# Patient Record
Sex: Female | Born: 1945 | Race: White | Hispanic: No | Smoking: Former smoker
Health system: Southern US, Community
[De-identification: ages and names within clinical notes are randomized; demographics above are authoritative.]

## PROBLEM LIST (undated history)

## (undated) DIAGNOSIS — M1990 Unspecified osteoarthritis, unspecified site: Secondary | ICD-10-CM

## (undated) DIAGNOSIS — M419 Scoliosis, unspecified: Secondary | ICD-10-CM

## (undated) DIAGNOSIS — T8859XA Other complications of anesthesia, initial encounter: Secondary | ICD-10-CM

## (undated) DIAGNOSIS — J329 Chronic sinusitis, unspecified: Secondary | ICD-10-CM

## (undated) DIAGNOSIS — K219 Gastro-esophageal reflux disease without esophagitis: Secondary | ICD-10-CM

## (undated) DIAGNOSIS — N903 Dysplasia of vulva, unspecified: Secondary | ICD-10-CM

## (undated) DIAGNOSIS — E039 Hypothyroidism, unspecified: Secondary | ICD-10-CM

## (undated) HISTORY — DX: Gastro-esophageal reflux disease without esophagitis: K21.9

## (undated) HISTORY — DX: Unspecified osteoarthritis, unspecified site: M19.90

## (undated) HISTORY — PX: OTHER SURGICAL HISTORY: SHX169

## (undated) HISTORY — DX: Scoliosis, unspecified: M41.9

## (undated) HISTORY — DX: Dysplasia of vulva, unspecified: N90.3

## (undated) HISTORY — DX: Hypothyroidism, unspecified: E03.9

## (undated) HISTORY — DX: Chronic sinusitis, unspecified: J32.9

## (undated) HISTORY — PX: NO PAST SURGERIES: SHX2092

---

## 2001-06-17 ENCOUNTER — Ambulatory Visit (HOSPITAL_COMMUNITY): Admission: RE | Admit: 2001-06-17 | Discharge: 2001-06-17 | Payer: Self-pay | Admitting: Obstetrics and Gynecology

## 2001-06-17 ENCOUNTER — Encounter: Payer: Self-pay | Admitting: Obstetrics and Gynecology

## 2001-06-30 DIAGNOSIS — D229 Melanocytic nevi, unspecified: Secondary | ICD-10-CM

## 2001-06-30 HISTORY — DX: Melanocytic nevi, unspecified: D22.9

## 2002-08-30 ENCOUNTER — Ambulatory Visit (HOSPITAL_COMMUNITY): Admission: RE | Admit: 2002-08-30 | Discharge: 2002-08-30 | Payer: Self-pay | Admitting: Family Medicine

## 2002-08-30 ENCOUNTER — Encounter: Payer: Self-pay | Admitting: Family Medicine

## 2002-11-09 ENCOUNTER — Encounter: Payer: Self-pay | Admitting: Obstetrics and Gynecology

## 2002-11-09 ENCOUNTER — Ambulatory Visit (HOSPITAL_COMMUNITY): Admission: RE | Admit: 2002-11-09 | Discharge: 2002-11-09 | Payer: Self-pay | Admitting: Obstetrics and Gynecology

## 2003-07-12 ENCOUNTER — Ambulatory Visit (HOSPITAL_COMMUNITY): Admission: RE | Admit: 2003-07-12 | Discharge: 2003-07-12 | Payer: Self-pay | Admitting: Family Medicine

## 2003-07-16 ENCOUNTER — Ambulatory Visit (HOSPITAL_COMMUNITY): Admission: RE | Admit: 2003-07-16 | Discharge: 2003-07-16 | Payer: Self-pay | Admitting: Family Medicine

## 2003-12-10 ENCOUNTER — Ambulatory Visit (HOSPITAL_COMMUNITY): Admission: RE | Admit: 2003-12-10 | Discharge: 2003-12-10 | Payer: Self-pay | Admitting: Obstetrics and Gynecology

## 2005-01-07 ENCOUNTER — Ambulatory Visit (HOSPITAL_COMMUNITY): Admission: RE | Admit: 2005-01-07 | Discharge: 2005-01-07 | Payer: Self-pay | Admitting: General Surgery

## 2005-01-20 ENCOUNTER — Ambulatory Visit (HOSPITAL_COMMUNITY): Admission: RE | Admit: 2005-01-20 | Discharge: 2005-01-20 | Payer: Self-pay | Admitting: Obstetrics and Gynecology

## 2006-03-23 ENCOUNTER — Ambulatory Visit (HOSPITAL_COMMUNITY): Admission: RE | Admit: 2006-03-23 | Discharge: 2006-03-23 | Payer: Self-pay | Admitting: Obstetrics and Gynecology

## 2007-05-10 ENCOUNTER — Ambulatory Visit (HOSPITAL_COMMUNITY): Admission: RE | Admit: 2007-05-10 | Discharge: 2007-05-10 | Payer: Self-pay | Admitting: Obstetrics and Gynecology

## 2007-07-19 ENCOUNTER — Ambulatory Visit (HOSPITAL_COMMUNITY): Admission: RE | Admit: 2007-07-19 | Discharge: 2007-07-19 | Payer: Self-pay | Admitting: Orthopaedic Surgery

## 2008-05-11 ENCOUNTER — Ambulatory Visit (HOSPITAL_COMMUNITY): Admission: RE | Admit: 2008-05-11 | Discharge: 2008-05-11 | Payer: Self-pay | Admitting: Obstetrics and Gynecology

## 2009-08-12 ENCOUNTER — Ambulatory Visit (HOSPITAL_COMMUNITY): Admission: RE | Admit: 2009-08-12 | Discharge: 2009-08-12 | Payer: Self-pay | Admitting: Obstetrics and Gynecology

## 2010-03-22 ENCOUNTER — Encounter: Payer: Self-pay | Admitting: Obstetrics and Gynecology

## 2010-07-18 NOTE — H&P (Signed)
NAME:  Kristi Weiss, Kristi Weiss               ACCOUNT NO.:  [id number]   MEDICAL RECORD NO.:  [id number]          PATIENT TYPE:  AMB   LOCATION:                                FACILITY:  [facility]   PHYSICIAN:  Dalia Heading, M.D.  DATE OF BIRTH:  03/17/1945   DATE OF ADMISSION:  DATE OF DISCHARGE:  LH                                HISTORY & PHYSICAL   CHIEF COMPLAINT:  Need for screening colonoscopy.   HISTORY OF PRESENT ILLNESS:  The patient is a 65 year old white female who  is referred for endoscopic evaluation.  She needs a colonoscopy for  screening purposes.  No abdominal pain, weight loss, nausea, vomiting,  diarrhea, constipation, melena, hematochezia have been noted.  She has never  had a colonoscopy.  There is no family history of colon carcinoma.   PAST MEDICAL HISTORY:  Hypothyroidism.   PAST SURGICAL HISTORY:  Noncontributory.   CURRENT MEDICATIONS:  Synthroid.   ALLERGIES:  No known drug allergies.   REVIEW OF SYSTEMS:  Noncontributory.   PHYSICAL EXAMINATION:  GENERAL:  The patient is a well-developed, well-  nourished, white female in no acute distress.  LUNGS:  Clear to auscultation with equal breath sounds bilaterally.  HEART:  Reveals a regular rate and rhythm without S3, S4, or murmurs.  ABDOMEN:  Soft, nontender, nondistended.  No hepatosplenomegaly or masses  are noted.  RECTAL:  Deferred to the procedure.   IMPRESSION:  Need for screening colonoscopy.   PLAN:  The patient is scheduled for a colonoscopy on December 31, 2004.  The  risks and benefits of the procedure including bleeding and perforation were  fully explained to the patient, gave informed consent.      Dalia Heading, M.D.  Electronically Signed     MAJ/MEDQ  D:  12/16/2004  T:  12/16/2004  Job:  [id number]   cc:   Dalia Heading, M.D.  Fax: [phone]   [facility]  Fax: [phone]   Patrica Duel, M.D.  Fax: [phone]

## 2010-11-24 ENCOUNTER — Other Ambulatory Visit (HOSPITAL_COMMUNITY): Payer: Self-pay | Admitting: Obstetrics and Gynecology

## 2010-11-24 DIAGNOSIS — Z139 Encounter for screening, unspecified: Secondary | ICD-10-CM

## 2010-11-27 ENCOUNTER — Ambulatory Visit (HOSPITAL_COMMUNITY): Payer: Self-pay

## 2010-12-01 ENCOUNTER — Ambulatory Visit (HOSPITAL_COMMUNITY)
Admission: RE | Admit: 2010-12-01 | Discharge: 2010-12-01 | Disposition: A | Discharge status: Home / Self Care | Payer: 59 | Source: Ambulatory Visit | Attending: Obstetrics and Gynecology | Admitting: Obstetrics and Gynecology

## 2010-12-01 DIAGNOSIS — Z139 Encounter for screening, unspecified: Secondary | ICD-10-CM

## 2010-12-01 DIAGNOSIS — Z1231 Encounter for screening mammogram for malignant neoplasm of breast: Secondary | ICD-10-CM | POA: Insufficient documentation

## 2012-01-04 ENCOUNTER — Other Ambulatory Visit (HOSPITAL_COMMUNITY): Payer: Self-pay | Admitting: Obstetrics and Gynecology

## 2012-01-04 DIAGNOSIS — Z139 Encounter for screening, unspecified: Secondary | ICD-10-CM

## 2012-01-11 ENCOUNTER — Ambulatory Visit (HOSPITAL_COMMUNITY)
Admission: RE | Admit: 2012-01-11 | Discharge: 2012-01-11 | Disposition: A | Discharge status: Home / Self Care | Payer: 59 | Source: Ambulatory Visit | Attending: Obstetrics and Gynecology | Admitting: Obstetrics and Gynecology

## 2012-01-11 DIAGNOSIS — Z139 Encounter for screening, unspecified: Secondary | ICD-10-CM

## 2012-01-11 DIAGNOSIS — Z1231 Encounter for screening mammogram for malignant neoplasm of breast: Secondary | ICD-10-CM | POA: Insufficient documentation

## 2012-04-12 DIAGNOSIS — C4492 Squamous cell carcinoma of skin, unspecified: Secondary | ICD-10-CM

## 2012-04-12 HISTORY — DX: Squamous cell carcinoma of skin, unspecified: C44.92

## 2013-03-15 ENCOUNTER — Other Ambulatory Visit (HOSPITAL_COMMUNITY): Payer: Self-pay | Admitting: Obstetrics and Gynecology

## 2013-03-15 DIAGNOSIS — Z139 Encounter for screening, unspecified: Secondary | ICD-10-CM

## 2013-03-21 ENCOUNTER — Ambulatory Visit (HOSPITAL_COMMUNITY)
Admission: RE | Admit: 2013-03-21 | Discharge: 2013-03-21 | Disposition: A | Discharge status: Home / Self Care | Payer: Medicare HMO | Source: Ambulatory Visit | Attending: Obstetrics and Gynecology | Admitting: Obstetrics and Gynecology

## 2013-03-21 DIAGNOSIS — Z139 Encounter for screening, unspecified: Secondary | ICD-10-CM

## 2013-03-21 DIAGNOSIS — Z1231 Encounter for screening mammogram for malignant neoplasm of breast: Secondary | ICD-10-CM | POA: Insufficient documentation

## 2013-05-09 ENCOUNTER — Other Ambulatory Visit (HOSPITAL_COMMUNITY): Payer: Self-pay | Admitting: Internal Medicine

## 2013-05-09 DIAGNOSIS — J329 Chronic sinusitis, unspecified: Secondary | ICD-10-CM

## 2013-05-11 ENCOUNTER — Ambulatory Visit (HOSPITAL_COMMUNITY): Payer: Medicare HMO

## 2013-05-17 ENCOUNTER — Ambulatory Visit (HOSPITAL_COMMUNITY)
Admission: RE | Admit: 2013-05-17 | Discharge: 2013-05-17 | Disposition: A | Discharge status: Home / Self Care | Payer: Medicare HMO | Source: Ambulatory Visit | Attending: Orthopedic Surgery | Admitting: Orthopedic Surgery

## 2013-05-17 ENCOUNTER — Other Ambulatory Visit: Payer: Self-pay | Admitting: Orthopedic Surgery

## 2013-05-17 DIAGNOSIS — M25561 Pain in right knee: Secondary | ICD-10-CM

## 2013-05-17 DIAGNOSIS — M25469 Effusion, unspecified knee: Secondary | ICD-10-CM | POA: Insufficient documentation

## 2013-05-17 DIAGNOSIS — M25569 Pain in unspecified knee: Secondary | ICD-10-CM | POA: Insufficient documentation

## 2013-05-17 DIAGNOSIS — R937 Abnormal findings on diagnostic imaging of other parts of musculoskeletal system: Secondary | ICD-10-CM | POA: Insufficient documentation

## 2013-05-17 DIAGNOSIS — M259 Joint disorder, unspecified: Secondary | ICD-10-CM | POA: Insufficient documentation

## 2013-05-18 ENCOUNTER — Ambulatory Visit (HOSPITAL_COMMUNITY): Payer: Medicare HMO

## 2013-05-18 ENCOUNTER — Ambulatory Visit (INDEPENDENT_AMBULATORY_CARE_PROVIDER_SITE_OTHER): Payer: Medicare HMO | Admitting: Orthopedic Surgery

## 2013-05-18 ENCOUNTER — Encounter: Payer: Self-pay | Admitting: Orthopedic Surgery

## 2013-05-18 VITALS — BP 150/88 | Ht 68.0 in | Wt 234.0 lb

## 2013-05-18 DIAGNOSIS — IMO0002 Reserved for concepts with insufficient information to code with codable children: Secondary | ICD-10-CM

## 2013-05-18 DIAGNOSIS — M1710 Unilateral primary osteoarthritis, unspecified knee: Secondary | ICD-10-CM | POA: Insufficient documentation

## 2013-05-18 DIAGNOSIS — M179 Osteoarthritis of knee, unspecified: Secondary | ICD-10-CM

## 2013-05-18 NOTE — Progress Notes (Signed)
Patient ID: Kristi Weiss, female   DOB: [birth year], 68 y.o.   MRN: [id number] New patient  Chief Complaint  Patient presents with  . Knee Pain    Right knee pain, no injury    History the patient reports a 5-6 week history of acute onset of medial knee pain which is worse in the morning gets better and then is worse again at night. She feels sharp dull burning medial pain 7/10 somewhat relieved by ibuprofen but not completely. She also reports catching tightness and swelling and decreased range of motion. Previous treatment includes prednisone and ibuprofen 3 tablets at a time.  Review of systems fatigue heartburn and joint aches otherwise normal she is allergic to penicillin or rashes hypothyroidism sinusitis history of scoliosis. Denied any previous surgery  She takes Synthroid Nexium and Levaquin family history of lung disease cancer diabetes arthritis social history married retired does not smoke or drink  Vital signs: BP 150/88  Ht 5\' 8"  (1.727 m)  Wt 234 lb (106.142 kg)  BMI 35.59 kg/m2   General the patient is well-developed and well-nourished grooming and hygiene are normal Oriented x3 Mood and affect normal Ambulation normal  Inspection of the left knee shows she has some loss of motion and no tenderness or swelling, knee remains stable motor exam normal skin intact  Right knee limited range of motion large joint effusion joints are stable strength is normal skin is clean dry and intact neurovascular exam normal  4 views of the knee were taken at the hospital she has medial arthritis with osteophytes and joint space narrowing  Impression fusion with osteoarthritis  Recommend aspiration injection  She is concerned about arthritis medications because of side effects  She does well with ibuprofen so we will take 3 tablets 3 times a day for 2 weeks then as needed I will see her again in 6 weeks  We aspirated and injected her knee  If she's not better then we will obtain  an MRI to evaluate for meniscal tear  She will also receive physical therapy to strengthen the knee.  Procedure aspiration plus Injection right knee Medications: Ethyl chloride for topical anesthetic. Lidocaine 1% 3 cc. 40 mg of Depo-Medrol per mL, 1 mL. Verbal consent. Timeout to confirm site of injection as right knee Alcohol was used to clean the skin followed by ethyl chloride to anesthetize the skin. A lateral approach was used to aspirate the right knee we obtained 50 cc of yellow fluid, we then proceeded to inject the knee with lidocaine and Depo-Medrol.  No complications were noted. A sterile bandage was applied.

## 2013-05-18 NOTE — Patient Instructions (Addendum)
Take 3 Ibuprofen 3 times a day for two weeks and then as needed Call to arrange therapy at [facility] and Tear Disorders of the Knee (Arthritis, Osteoarthritis) Everyone will experience wear and tear injuries (arthritis, osteoarthritis) of the knee. These are the changes we all get as we age. They come from the joint stress of daily living. The amount of cartilage damage in your knee and your symptoms determine if you need surgery. Mild problems require approximately two months recovery time. More severe problems take several months to recover. With mild problems, your surgeon may find worn and rough cartilage surfaces. With severe changes, your surgeon may find cartilage that has completely worn away and exposed the bone. Loose bodies of bone and cartilage, bone spurs (excess bone growth), and injuries to the menisci (cushions between the large bones of your leg) are also common. All of these problems can cause pain. For a mild wear and tear problem, rough cartilage may simply need to be shaved and smoothed. For more severe problems with areas of exposed bone, your surgeon may use an instrument for roughing up the bone surfaces to stimulate new cartilage growth. Loose bodies are usually removed. Torn menisci may be trimmed or repaired.  Osteoarthritis Osteoarthritis is a disease that causes soreness and swelling (inflammation) of a joint. It occurs when the cartilage at the affected joint wears down. Cartilage acts as a cushion, covering the ends of bones where they meet to form a joint. Osteoarthritis is the most common form of arthritis. It often occurs in older people. The joints affected most often by this condition include those in the:  Ends of the fingers.  Thumbs.  Neck.  Lower back.  Knees.  Hips. CAUSES  Over time, the cartilage that covers the ends of bones begins to wear away. This causes bone to rub on bone, producing pain and stiffness in the affected joints.  RISK  FACTORS Certain factors can increase your chances of having osteoarthritis, including:  Older age.  Excessive body weight.  Overuse of joints. SIGNS AND SYMPTOMS   Pain, swelling, and stiffness in the joint.  Over time, the joint may lose its normal shape.  Small deposits of bone (osteophytes) may grow on the edges of the joint.  Bits of bone or cartilage can break off and float inside the joint space. This may cause more pain and damage. DIAGNOSIS  Your health care provider will do a physical exam and ask about your symptoms. Various tests may be ordered, such as:  X-rays of the affected joint.  An MRI scan.  Blood tests to rule out other types of arthritis.  Joint fluid tests. This involves using a needle to draw fluid from the joint and examining the fluid under a microscope. TREATMENT  Goals of treatment are to control pain and improve joint function. Treatment plans may include:  A prescribed exercise program that allows for rest and joint relief.  A weight control plan.  Pain relief techniques, such as:  Properly applied heat and cold.  Electric pulses delivered to nerve endings under the skin (transcutaneous electrical nerve stimulation, TENS).  Massage.  Certain nutritional supplements.  Medicines to control pain, such as:  Acetaminophen.  Nonsteroidal anti-inflammatory drugs (NSAIDs), such as naproxen.  Narcotic or central-acting agents, such as tramadol.  Corticosteroids. These can be given orally or as an injection.  Surgery to reposition the bones and relieve pain (osteotomy) or to remove loose pieces of bone and cartilage. Joint  replacement may be needed in advanced states of osteoarthritis. HOME CARE INSTRUCTIONS   Only take over-the-counter or prescription medicines as directed by your health care provider. Take all medicines exactly as instructed.  Maintain a healthy weight. Follow your health care provider's instructions for weight control.  This may include dietary instructions.  Exercise as directed. Your health care provider can recommend specific types of exercise. These may include:  Strengthening exercises These are done to strengthen the muscles that support joints affected by arthritis. They can be performed with weights or with exercise bands to add resistance.  Aerobic activities These are exercises, such as brisk walking or low-impact aerobics, that get your heart pumping.  Range-of-motion activities These keep your joints limber.  Balance and agility exercises These help you maintain daily living skills.  Rest your affected joints as directed by your health care provider.  Follow up with your health care provider as directed. SEEK MEDICAL CARE IF:   Your skin turns red.  You develop a rash in addition to your joint pain.  You have worsening joint pain. SEEK IMMEDIATE MEDICAL CARE IF:  You have a significant loss of weight or appetite.  You have a fever along with joint or muscle aches.  You have night sweats. [place] of Arthritis and Musculoskeletal and Skin Diseases: www.niams.SouthExposed.es [employer] on Aging: http://kim-miller.com/ American College of Rheumatology: www.rheumatology.org Document Released: 02/16/2005 Document Revised: 12/07/2012 Document Reviewed: 10/24/2012 [facility] Patient Information 2014 [place], [place].   Joint Injection  Care After  Refer to this sheet in the next few days. These instructions provide you with information on caring for yourself after you have had a joint injection. Your caregiver also may give you more specific instructions. Your treatment has been planned according to current medical practices, but problems sometimes occur. Call your caregiver if you have any problems or questions after your procedure.  After any type of joint injection, it is not uncommon to experience:  Soreness, swelling, or bruising around the injection site.    Mild numbness, tingling, or weakness around the injection site caused by the numbing medicine used before or with the injection. It also is possible to experience the following effects associated with the specific agent after injection:  Iodine-based contrast agents:  Allergic reaction (itching, hives, widespread redness, and swelling beyond the injection site).  Corticosteroids (These effects are rare.):  Allergic reaction.  Increased blood sugar levels (If you have diabetes and you notice that your blood sugar levels have increased, notify your caregiver).  Increased blood pressure levels.  Mood swings.  Hyaluronic acid in the use of viscosupplementation.  Temporary heat or redness.  Temporary rash and itching.  Increased fluid accumulation in the injected joint. These effects all should resolve within a day after your procedure.  HOME CARE INSTRUCTIONS  Limit yourself to light activity the day of your procedure. Avoid lifting heavy objects, bending, stooping, or twisting.  Take prescription or over-the-counter pain medication as directed by your caregiver.  You may apply ice to your injection site to reduce pain and swelling the day of your procedure. Ice may be applied 3-4 times:  Put ice in a plastic bag.  Place a towel between your skin and the bag.  Leave the ice on for no longer than 15-20 minutes each time. SEEK IMMEDIATE MEDICAL CARE IF:  Pain and swelling get worse rather than better or extend beyond the injection site.  Numbness does not go away.  Blood or fluid  continues to leak from the injection site.  You have chest pain.  You have swelling of your face or tongue.  You have trouble breathing or you become dizzy.  You develop a fever, chills, or severe tenderness at the injection site that last longer than 1 day. MAKE SURE YOU:  Understand these instructions.  Watch your condition.  Get help right away if you are not doing well or if you get worse. Document Released:  10/30/2010 Document Revised: 05/11/2011 Document Reviewed: 10/30/2010  [facility] Patient Information 2014 [place].

## 2013-05-22 ENCOUNTER — Ambulatory Visit (HOSPITAL_COMMUNITY)
Admission: RE | Admit: 2013-05-22 | Discharge: 2013-05-22 | Disposition: A | Discharge status: Home / Self Care | Payer: Medicare HMO | Source: Ambulatory Visit | Attending: Orthopedic Surgery | Admitting: Orthopedic Surgery

## 2013-05-22 DIAGNOSIS — R262 Difficulty in walking, not elsewhere classified: Secondary | ICD-10-CM | POA: Diagnosis present

## 2013-05-22 DIAGNOSIS — IMO0001 Reserved for inherently not codable concepts without codable children: Secondary | ICD-10-CM | POA: Insufficient documentation

## 2013-05-22 DIAGNOSIS — M25569 Pain in unspecified knee: Secondary | ICD-10-CM | POA: Insufficient documentation

## 2013-05-22 DIAGNOSIS — R269 Unspecified abnormalities of gait and mobility: Secondary | ICD-10-CM | POA: Insufficient documentation

## 2013-05-22 NOTE — Evaluation (Signed)
Physical Therapy Evaluation  Patient Details  Name: Kristi Weiss MRN: [id number] Date of Birth: 12-01-1945  Today's Date: 05/22/2013 Time: 1300-1350 PT Time Calculation (min): 50 min Charges: 1 Evaluation, 30 min therapeutic ex              Visit#: 1 of 8  Re-eval: 06/21/13    Authorization: [place] medicare  Authorized visits: 1 of 10    Past Medical History:  Past Medical History  Diagnosis Date  . Thyroid disease   . Scoliosis   . Sinusitis    Past Surgical History: No past surgical history on file.  Subjective Symptoms/Limitations Symptoms: Patient felt knee start popping in January, pain along medial knee, popping in the center "it felt like it was shifting over (patella)." More pain in the morning. pain wakes patient while sleeping.  Pertinent History: L knee pain that improved followign cortizone injection. Patient recieved a cortizone injection on 05/17/13 in R knee.  History of scoliosis.  Limitations: Walking How long can you walk comfortably?: pain with initial ambulation and after resting following walking.  Repetition: Decreases Symptoms Pain Assessment Currently in Pain?: Yes Pain Score: 6  Pain Location: Knee Pain Orientation: Right;Mid;Medial Pain Type: Chronic pain Pain Onset: More than a month ago Pain Frequency: Intermittent Pain Relieving Factors: eases with walking but increases with prolonged walking.  Effect of Pain on Daily Activities: gardenig and yard work.   Balance Screening Balance Screen Has the patient fallen in the past 6 months: No   Physical Therapy Assessment and Plan PT Assessment and Plan Clinical Impression Statement: Patient displays Rt medial knee pain secondary to poor loading and unloading mechanics with gait. Patient's primary functional limitation is walking. All knee ligamentous and meniscal testing is WNL. Patient's knee pain is attributed to limited quad mobilitly resultign in increased pull on the knee as well as  decreased  hip IR/ER, Abd/Add, and limited extension/flexion ROM all resulting in limited gait mobility, thes mobility limitations are causing the patient to land with excessive force through her knee and subsequent inflammation follwoing walking. Patient would benefit from skilled physical therapy for 4 weeks 2x a week to progress HEP  to  progress hip and knee mobility and eventually progress strengthenign as patient's knee pain improves to assist ptietn in returning to workign in her garden and being ambulate without pain.  Pt will benefit from skilled therapeutic intervention in order to improve on the following deficits: Abnormal gait;Decreased activity tolerance;Decreased mobility;Increased muscle spasms;Decreased range of motion;Decreased strength;Difficulty walking Rehab Potential: Excellent PT Frequency: Min 2X/week PT Duration: 4 weeks PT Treatment/Interventions: Gait training;Stair training;Functional mobility training;Therapeutic activities;Therapeutic exercise;Manual techniques;Patient/family education PT Plan: Current focus opn mobility training of hip and knee with focus on hip IR/ER, Abd/Add. Continue quad stretching, split stance hip excursions, walking hip excursions, claf stretching. Next session initialize hip flexor stretch, Groin stretch, Piriformis stretch. Progress to strengthening s ROM improves.     Goals Home Exercise Program Pt/caregiver will Perform Home Exercise Program: For increased ROM;Independently;For increased strengthening PT Goal: Perform Home Exercise Program - Progress: Goal set today PT Short Term Goals Time to Complete Short Term Goals: 2 weeks PT Short Term Goal 1: Patient hip IR/ER will improve to 40 degrees bilateral to indicate improved ability of hip to absorb heel strike and foot flat durign gait. PT Short Term Goal 2: patient's Eli test will improve to 110 degrees for improved quad length to indicate decreased muscle spasms in anterior thigh muscles.   PT Short Term  Goal 3: Patient's squat depth will improve to >90 degrees to progress patient towards half kneeling so she may work in her garden PT Short Term Goal 4: Patient's dorsiflexion mobility will improve to 20 degrees to indicatte decreased calf cramps and allow for increased stride length.  PT Long Term Goals Time to Complete Long Term Goals: 4 weeks PT Long Term Goal 1: Patient will walk with knee pain <2/10 for >3 hours.  PT Long Term Goal 2: Patient will be able to half kneel inorder to work in garden without knee or back pain.   Problem List Patient Active Problem List   Diagnosis Date Noted  . Difficulty in walking(719.7) 05/22/2013  . Arthritis of knee, degenerative 05/18/2013    PT - End of Session Activity Tolerance: Patient tolerated treatment well General Behavior During Therapy: WFL for tasks assessed/performed PT Plan of Care PT Home Exercise Plan:  quad stretching, split stance hip excursions, walking hip excursions, claf stretching  PT Patient Instructions: 2x daily Consulted and Agree with Plan of Care: Patient  GP Functional Assessment Tool Used: Clinical judgement Functional Limitation: Mobility: Walking and moving around Mobility: Walking and Moving Around Current Status (U1324): At least 20 percent but less than 40 percent impaired, limited or restricted Mobility: Walking and Moving Around Goal Status [id number]): At least 1 percent but less than 20 percent impaired, limited or restricted  [name] 05/22/2013, 5:20 PM  Physician Documentation Your signature is required to indicate approval of the treatment plan as stated above.  Please sign and either send electronically or make a copy of this report for your files and return this physician signed original.   Please mark one 1.__approve of plan  2. ___approve of plan with the following conditions.   ______________________________                                                           _____________________ Physician Signature                                                                                                             Date

## 2013-05-22 NOTE — Evaluation (Signed)
Physical Therapy Evaluation Patient Details  Name: Kristi Weiss MRN: [id number] Date of Birth: 02-17-1946  Today's Date: 05/22/2013 Time: 1300-1350 PT Time Calculation (min): 50 min              Visit#: 1 of 8  Re-eval: 06/21/13   Authorization: Bernadene Person     Past Medical History:  Past Medical History  Diagnosis Date  . Thyroid disease   . Scoliosis   . Sinusitis    Past Surgical History: No past surgical history on file.  Subjective Symptoms/Limitations Symptoms: Patient felt knee start popping in January, pain along medial knee, popping in the center "it felt like it was shifting over (patella)." More pain in the morning. pain wakes patient while sleeping.  Pertinent History: L knee pain that improved followign cortizone injection. Patient recieved a cortizone injection on 05/17/13 in R knee.  History of scoliosis.  Limitations: Walking How long can you walk comfortably?: pain with initial ambulation and after resting following walking.  Repetition: Decreases Symptoms Pain Assessment Currently in Pain?: Yes Pain Score: 6  Pain Location: Knee Pain Orientation: Right;Mid;Medial Pain Type: Chronic pain Pain Onset: More than a month ago Pain Frequency: Intermittent Pain Relieving Factors: eases with walking but increases with prolonged walking.  Effect of Pain on Daily Activities: gardenig and yard work.   Balance Screening Balance Screen Has the patient fallen in the past 6 months: No  Exercise/Treatments Stretches Sports administrator: 3 reps;30 seconds Gastroc Stretch: 3 reps;30 seconds Standing Other Standing Knee Exercises: split stance 3D hip excursions x 10 each, Walking  Other Standing Knee Exercises: Walking lateral hip excursion x 53ft, walking IR hip excursion x 63ft  Physical Therapy Assessment and Plan PT Assessment and Plan Clinical Impression Statement: Patient displays Rt medial knee pain secondary to poor loading and unloading mechanics with gait.  Patient's primary functional limitation is walking. All knee ligamentous and meniscal testing is WNL. Patient's knee pain is attributed to limited quad mobilitly resultign in increased pull on the knee as well as decreased  hip IR/ER, Abd/Add, and limited extension/flexion ROM all resulting in limited gait mobility, thes mobility limitations are causing the patient to land with excessive force through her knee and subsequent inflammation follwoing walking. Patient would benefit from skilled physical therapy for 4 weeks 2x a week to progress HEP  to  progress hip and knee mobility and eventually progress strengthenign as patient's knee pain improves to assist ptietn in returning to workign in her garden and being ambulate without pain.  Pt will benefit from skilled therapeutic intervention in order to improve on the following deficits: Abnormal gait;Decreased activity tolerance;Decreased mobility;Increased muscle spasms;Decreased range of motion;Decreased strength;Difficulty walking Rehab Potential: Excellent PT Frequency: Min 2X/week PT Duration: 4 weeks PT Treatment/Interventions: Gait training;Stair training;Functional mobility training;Therapeutic activities;Therapeutic exercise;Manual techniques;Patient/family education PT Plan: Current focus opn mobility training of hip and knee with focus on hip IR/ER, Abd/Add. Continue quad stretching, split stance hip excursions, walking hip excursions, claf stretching. Next session initialize hip flexor stretch, Groin stretch, Piriformis stretch. Progress to strengthening s ROM improves.     Goals Home Exercise Program Pt/caregiver will Perform Home Exercise Program: For increased ROM;Independently;For increased strengthening PT Goal: Perform Home Exercise Program - Progress: Goal set today PT Short Term Goals Time to Complete Short Term Goals: 2 weeks PT Short Term Goal 1: Patient hip IR/ER will improve to 40 degrees bilateral to indicate improved ability of  hip to absorb heel strike and foot flat durign gait. PT  Short Term Goal 2: patient's Eli test will improve to 110 degrees for improved quad length to indicate decreased muscle spasms in anterior thigh muscles.  PT Short Term Goal 3: Patient's squat depth will improve to >90 degrees to progress patient towards half kneeling so she may work in her garden PT Short Term Goal 4: Patient's dorsiflexion mobility will improve to 20 degrees to indicatte decreased calf cramps and allow for increased stride length.  PT Long Term Goals Time to Complete Long Term Goals: 4 weeks PT Long Term Goal 1: Patient will walk with knee pain <2/10 for >3 hours.  PT Long Term Goal 2: Patient will be able to half kneel inorder to work in garden without knee or back pain.   Problem List Patient Active Problem List   Diagnosis Date Noted  . Difficulty in walking(719.7) 05/22/2013  . Arthritis of knee, degenerative 05/18/2013    PT - End of Session Activity Tolerance: Patient tolerated treatment well General Behavior During Therapy: WFL for tasks assessed/performed PT Plan of Care PT Home Exercise Plan:  quad stretching, split stance hip excursions, walking hip excursions, claf stretching  PT Patient Instructions: 2x daily Consulted and Agree with Plan of Care: Patient  GP Functional Assessment Tool Used: Clinical judgement Functional Limitation: Mobility: Walking and moving around Mobility: Walking and Moving Around Current Status (N2355): At least 20 percent but less than 40 percent impaired, limited or restricted Mobility: Walking and Moving Around Goal Status [phone]): At least 1 percent but less than 20 percent impaired, limited or restricted  Vihan Santagata R DPT, PT 05/22/2013, 5:09 PM  Physician Documentation Your signature is required to indicate approval of the treatment plan as stated above.  Please sign and either send electronically or make a copy of this report for your files and return this  physician signed original.   Please mark one 1.__approve of plan  2. ___approve of plan with the following conditions.   ______________________________                                                          _____________________ Physician Signature                                                                                                             Date

## 2013-06-08 ENCOUNTER — Ambulatory Visit (HOSPITAL_COMMUNITY): Admission: RE | Admit: 2013-06-08 | Payer: Medicare HMO | Source: Ambulatory Visit

## 2013-06-20 ENCOUNTER — Ambulatory Visit (HOSPITAL_COMMUNITY): Payer: Medicare HMO

## 2013-06-29 ENCOUNTER — Ambulatory Visit (INDEPENDENT_AMBULATORY_CARE_PROVIDER_SITE_OTHER): Payer: Medicare HMO | Admitting: Orthopedic Surgery

## 2013-06-29 VITALS — BP 147/89 | Ht 68.0 in | Wt 234.0 lb

## 2013-06-29 DIAGNOSIS — M179 Osteoarthritis of knee, unspecified: Secondary | ICD-10-CM

## 2013-06-29 DIAGNOSIS — M1710 Unilateral primary osteoarthritis, unspecified knee: Secondary | ICD-10-CM

## 2013-06-29 DIAGNOSIS — IMO0002 Reserved for concepts with insufficient information to code with codable children: Secondary | ICD-10-CM

## 2013-06-29 NOTE — Patient Instructions (Signed)
Ibuprofen as needed exercises

## 2013-06-29 NOTE — Progress Notes (Signed)
Patient ID: Kristi Weiss, female   DOB: [birth year], 68 y.o.   MRN: [id number]  Chief Complaint  Patient presents with  . Follow-up    6 week recheck right knee s/p therapy    Osteoarthritis of the right knee. She has improved with decreased pain. She doesn't feel she needs anything other than mild pain relievers at this time. We discussed Orthovisc injections but not necessary right now. Knee flexion is good minimal crepitance normal stability tests are noted. No McMurray sign.  Review of systems negative catching locking neurologic findings negative.  BP 147/89  Ht 5\' 8"  (1.727 m)  Wt 234 lb (106.142 kg)  BMI 35.59 kg/m2 General appearance is normal, the patient is alert and oriented x3 with normal mood and affect.  Knee exam again shows good knee flexion minimal crepitance normal stability negative McMurray sign minimal tenderness no swelling.   Impression osteoarthritis right knee. Followup to be x-ray the knee.

## 2013-11-29 ENCOUNTER — Encounter: Payer: Self-pay | Admitting: Orthopedic Surgery

## 2014-07-23 ENCOUNTER — Other Ambulatory Visit (HOSPITAL_COMMUNITY): Payer: Self-pay | Admitting: Obstetrics and Gynecology

## 2014-07-23 DIAGNOSIS — Z1231 Encounter for screening mammogram for malignant neoplasm of breast: Secondary | ICD-10-CM

## 2014-08-15 ENCOUNTER — Ambulatory Visit (HOSPITAL_COMMUNITY): Payer: Medicare HMO

## 2014-11-14 ENCOUNTER — Ambulatory Visit (HOSPITAL_COMMUNITY)
Admission: RE | Admit: 2014-11-14 | Discharge: 2014-11-14 | Disposition: A | Discharge status: Home / Self Care | Payer: PPO | Source: Ambulatory Visit | Attending: Obstetrics and Gynecology | Admitting: Obstetrics and Gynecology

## 2014-11-14 DIAGNOSIS — Z1231 Encounter for screening mammogram for malignant neoplasm of breast: Secondary | ICD-10-CM

## 2015-05-15 DIAGNOSIS — Z6833 Body mass index (BMI) 33.0-33.9, adult: Secondary | ICD-10-CM | POA: Diagnosis not present

## 2015-05-15 DIAGNOSIS — K224 Dyskinesia of esophagus: Secondary | ICD-10-CM | POA: Diagnosis not present

## 2015-05-15 DIAGNOSIS — M1991 Primary osteoarthritis, unspecified site: Secondary | ICD-10-CM | POA: Diagnosis not present

## 2015-05-15 DIAGNOSIS — I493 Ventricular premature depolarization: Secondary | ICD-10-CM | POA: Diagnosis not present

## 2015-05-15 DIAGNOSIS — R0789 Other chest pain: Secondary | ICD-10-CM | POA: Diagnosis not present

## 2015-05-15 DIAGNOSIS — Z0001 Encounter for general adult medical examination with abnormal findings: Secondary | ICD-10-CM | POA: Diagnosis not present

## 2015-05-15 DIAGNOSIS — Z23 Encounter for immunization: Secondary | ICD-10-CM | POA: Diagnosis not present

## 2015-05-15 DIAGNOSIS — Z1389 Encounter for screening for other disorder: Secondary | ICD-10-CM | POA: Diagnosis not present

## 2015-05-15 DIAGNOSIS — E063 Autoimmune thyroiditis: Secondary | ICD-10-CM | POA: Diagnosis not present

## 2015-05-15 DIAGNOSIS — E6609 Other obesity due to excess calories: Secondary | ICD-10-CM | POA: Diagnosis not present

## 2015-05-15 DIAGNOSIS — R0902 Hypoxemia: Secondary | ICD-10-CM | POA: Diagnosis not present

## 2015-05-21 ENCOUNTER — Other Ambulatory Visit: Payer: Self-pay | Admitting: Internal Medicine

## 2015-05-21 DIAGNOSIS — Z23 Encounter for immunization: Secondary | ICD-10-CM

## 2015-05-23 ENCOUNTER — Other Ambulatory Visit: Payer: Self-pay | Admitting: Internal Medicine

## 2015-05-23 DIAGNOSIS — M810 Age-related osteoporosis without current pathological fracture: Secondary | ICD-10-CM

## 2015-06-05 DIAGNOSIS — R0789 Other chest pain: Secondary | ICD-10-CM | POA: Diagnosis not present

## 2015-06-05 DIAGNOSIS — R0602 Shortness of breath: Secondary | ICD-10-CM | POA: Diagnosis not present

## 2015-06-06 DIAGNOSIS — R0602 Shortness of breath: Secondary | ICD-10-CM | POA: Diagnosis not present

## 2015-06-06 DIAGNOSIS — R0789 Other chest pain: Secondary | ICD-10-CM | POA: Diagnosis not present

## 2015-08-02 DIAGNOSIS — Z1389 Encounter for screening for other disorder: Secondary | ICD-10-CM | POA: Diagnosis not present

## 2015-08-02 DIAGNOSIS — E781 Pure hyperglyceridemia: Secondary | ICD-10-CM | POA: Diagnosis not present

## 2015-08-02 DIAGNOSIS — Z0001 Encounter for general adult medical examination with abnormal findings: Secondary | ICD-10-CM | POA: Diagnosis not present

## 2015-08-02 DIAGNOSIS — Z6833 Body mass index (BMI) 33.0-33.9, adult: Secondary | ICD-10-CM | POA: Diagnosis not present

## 2015-08-02 DIAGNOSIS — R079 Chest pain, unspecified: Secondary | ICD-10-CM | POA: Diagnosis not present

## 2015-08-09 DIAGNOSIS — Z0001 Encounter for general adult medical examination with abnormal findings: Secondary | ICD-10-CM | POA: Diagnosis not present

## 2015-08-12 ENCOUNTER — Other Ambulatory Visit: Payer: PPO

## 2015-08-14 DIAGNOSIS — H2513 Age-related nuclear cataract, bilateral: Secondary | ICD-10-CM | POA: Diagnosis not present

## 2015-08-14 DIAGNOSIS — H40013 Open angle with borderline findings, low risk, bilateral: Secondary | ICD-10-CM | POA: Diagnosis not present

## 2015-08-15 DIAGNOSIS — D239 Other benign neoplasm of skin, unspecified: Secondary | ICD-10-CM | POA: Diagnosis not present

## 2015-08-15 DIAGNOSIS — L570 Actinic keratosis: Secondary | ICD-10-CM | POA: Diagnosis not present

## 2015-08-15 DIAGNOSIS — D485 Neoplasm of uncertain behavior of skin: Secondary | ICD-10-CM | POA: Diagnosis not present

## 2015-08-15 DIAGNOSIS — L821 Other seborrheic keratosis: Secondary | ICD-10-CM | POA: Diagnosis not present

## 2015-09-02 ENCOUNTER — Other Ambulatory Visit: Payer: PPO

## 2015-09-26 ENCOUNTER — Ambulatory Visit
Admission: RE | Admit: 2015-09-26 | Discharge: 2015-09-26 | Disposition: A | Discharge status: Home / Self Care | Payer: PPO | Source: Ambulatory Visit | Attending: Internal Medicine | Admitting: Internal Medicine

## 2015-09-26 DIAGNOSIS — M810 Age-related osteoporosis without current pathological fracture: Secondary | ICD-10-CM

## 2015-09-26 DIAGNOSIS — Z780 Asymptomatic menopausal state: Secondary | ICD-10-CM | POA: Diagnosis not present

## 2015-09-26 DIAGNOSIS — M8589 Other specified disorders of bone density and structure, multiple sites: Secondary | ICD-10-CM | POA: Diagnosis not present

## 2016-01-13 ENCOUNTER — Other Ambulatory Visit (HOSPITAL_COMMUNITY): Payer: Self-pay | Admitting: Internal Medicine

## 2016-01-13 DIAGNOSIS — Z1231 Encounter for screening mammogram for malignant neoplasm of breast: Secondary | ICD-10-CM

## 2016-01-29 ENCOUNTER — Ambulatory Visit (HOSPITAL_COMMUNITY)
Admission: RE | Admit: 2016-01-29 | Discharge: 2016-01-29 | Disposition: A | Discharge status: Home / Self Care | Payer: PPO | Source: Ambulatory Visit | Attending: Internal Medicine | Admitting: Internal Medicine

## 2016-01-29 DIAGNOSIS — Z1231 Encounter for screening mammogram for malignant neoplasm of breast: Secondary | ICD-10-CM

## 2016-02-05 DIAGNOSIS — E063 Autoimmune thyroiditis: Secondary | ICD-10-CM | POA: Diagnosis not present

## 2016-02-05 DIAGNOSIS — E039 Hypothyroidism, unspecified: Secondary | ICD-10-CM | POA: Diagnosis not present

## 2016-02-05 DIAGNOSIS — Z1389 Encounter for screening for other disorder: Secondary | ICD-10-CM | POA: Diagnosis not present

## 2016-02-05 DIAGNOSIS — B49 Unspecified mycosis: Secondary | ICD-10-CM | POA: Diagnosis not present

## 2016-02-05 DIAGNOSIS — Z6833 Body mass index (BMI) 33.0-33.9, adult: Secondary | ICD-10-CM | POA: Diagnosis not present

## 2016-02-05 DIAGNOSIS — Z23 Encounter for immunization: Secondary | ICD-10-CM | POA: Diagnosis not present

## 2016-02-05 DIAGNOSIS — E669 Obesity, unspecified: Secondary | ICD-10-CM | POA: Diagnosis not present

## 2016-08-24 DIAGNOSIS — Z6832 Body mass index (BMI) 32.0-32.9, adult: Secondary | ICD-10-CM | POA: Diagnosis not present

## 2016-08-24 DIAGNOSIS — E063 Autoimmune thyroiditis: Secondary | ICD-10-CM | POA: Diagnosis not present

## 2016-08-24 DIAGNOSIS — Z1389 Encounter for screening for other disorder: Secondary | ICD-10-CM | POA: Diagnosis not present

## 2016-08-24 DIAGNOSIS — H109 Unspecified conjunctivitis: Secondary | ICD-10-CM | POA: Diagnosis not present

## 2016-08-24 DIAGNOSIS — J302 Other seasonal allergic rhinitis: Secondary | ICD-10-CM | POA: Diagnosis not present

## 2016-08-24 DIAGNOSIS — M810 Age-related osteoporosis without current pathological fracture: Secondary | ICD-10-CM | POA: Diagnosis not present

## 2016-09-18 DIAGNOSIS — D229 Melanocytic nevi, unspecified: Secondary | ICD-10-CM | POA: Diagnosis not present

## 2016-09-18 DIAGNOSIS — L570 Actinic keratosis: Secondary | ICD-10-CM | POA: Diagnosis not present

## 2017-01-28 DIAGNOSIS — E782 Mixed hyperlipidemia: Secondary | ICD-10-CM | POA: Diagnosis not present

## 2017-01-28 DIAGNOSIS — E063 Autoimmune thyroiditis: Secondary | ICD-10-CM | POA: Diagnosis not present

## 2017-01-28 DIAGNOSIS — Z6833 Body mass index (BMI) 33.0-33.9, adult: Secondary | ICD-10-CM | POA: Diagnosis not present

## 2017-01-28 DIAGNOSIS — Z23 Encounter for immunization: Secondary | ICD-10-CM | POA: Diagnosis not present

## 2017-01-28 DIAGNOSIS — Z1389 Encounter for screening for other disorder: Secondary | ICD-10-CM | POA: Diagnosis not present

## 2017-01-28 DIAGNOSIS — E6609 Other obesity due to excess calories: Secondary | ICD-10-CM | POA: Diagnosis not present

## 2017-01-28 DIAGNOSIS — M1991 Primary osteoarthritis, unspecified site: Secondary | ICD-10-CM | POA: Diagnosis not present

## 2017-03-16 DIAGNOSIS — H40013 Open angle with borderline findings, low risk, bilateral: Secondary | ICD-10-CM | POA: Diagnosis not present

## 2017-03-16 DIAGNOSIS — H2513 Age-related nuclear cataract, bilateral: Secondary | ICD-10-CM | POA: Diagnosis not present

## 2017-03-18 ENCOUNTER — Other Ambulatory Visit (HOSPITAL_COMMUNITY): Payer: Self-pay | Admitting: Internal Medicine

## 2017-03-18 DIAGNOSIS — Z1231 Encounter for screening mammogram for malignant neoplasm of breast: Secondary | ICD-10-CM

## 2017-03-22 DIAGNOSIS — R944 Abnormal results of kidney function studies: Secondary | ICD-10-CM | POA: Diagnosis not present

## 2017-03-22 DIAGNOSIS — Z0000 Encounter for general adult medical examination without abnormal findings: Secondary | ICD-10-CM | POA: Diagnosis not present

## 2017-03-22 DIAGNOSIS — E782 Mixed hyperlipidemia: Secondary | ICD-10-CM | POA: Diagnosis not present

## 2017-03-22 DIAGNOSIS — R7303 Prediabetes: Secondary | ICD-10-CM | POA: Diagnosis not present

## 2017-03-22 DIAGNOSIS — Z0001 Encounter for general adult medical examination with abnormal findings: Secondary | ICD-10-CM | POA: Diagnosis not present

## 2017-03-22 DIAGNOSIS — E063 Autoimmune thyroiditis: Secondary | ICD-10-CM | POA: Diagnosis not present

## 2017-03-22 DIAGNOSIS — E6609 Other obesity due to excess calories: Secondary | ICD-10-CM | POA: Diagnosis not present

## 2017-03-22 DIAGNOSIS — J019 Acute sinusitis, unspecified: Secondary | ICD-10-CM | POA: Diagnosis not present

## 2017-03-22 DIAGNOSIS — Z6833 Body mass index (BMI) 33.0-33.9, adult: Secondary | ICD-10-CM | POA: Diagnosis not present

## 2017-03-25 ENCOUNTER — Ambulatory Visit (HOSPITAL_COMMUNITY): Payer: PPO

## 2017-03-29 ENCOUNTER — Encounter: Payer: Self-pay | Admitting: Internal Medicine

## 2017-04-08 DIAGNOSIS — J111 Influenza due to unidentified influenza virus with other respiratory manifestations: Secondary | ICD-10-CM | POA: Diagnosis not present

## 2017-04-08 DIAGNOSIS — B349 Viral infection, unspecified: Secondary | ICD-10-CM | POA: Diagnosis not present

## 2017-04-08 DIAGNOSIS — R6889 Other general symptoms and signs: Secondary | ICD-10-CM | POA: Diagnosis not present

## 2017-04-08 DIAGNOSIS — E6609 Other obesity due to excess calories: Secondary | ICD-10-CM | POA: Diagnosis not present

## 2017-04-08 DIAGNOSIS — Z6832 Body mass index (BMI) 32.0-32.9, adult: Secondary | ICD-10-CM | POA: Diagnosis not present

## 2017-04-13 DIAGNOSIS — Z6832 Body mass index (BMI) 32.0-32.9, adult: Secondary | ICD-10-CM | POA: Diagnosis not present

## 2017-04-13 DIAGNOSIS — G4762 Sleep related leg cramps: Secondary | ICD-10-CM | POA: Diagnosis not present

## 2017-04-13 DIAGNOSIS — Z1389 Encounter for screening for other disorder: Secondary | ICD-10-CM | POA: Diagnosis not present

## 2017-04-13 DIAGNOSIS — E6609 Other obesity due to excess calories: Secondary | ICD-10-CM | POA: Diagnosis not present

## 2017-04-13 DIAGNOSIS — J019 Acute sinusitis, unspecified: Secondary | ICD-10-CM | POA: Diagnosis not present

## 2017-04-15 ENCOUNTER — Ambulatory Visit: Payer: PPO

## 2017-05-03 ENCOUNTER — Ambulatory Visit: Payer: Self-pay

## 2017-06-28 ENCOUNTER — Encounter (HOSPITAL_COMMUNITY): Payer: Self-pay

## 2017-06-28 ENCOUNTER — Ambulatory Visit (HOSPITAL_COMMUNITY)
Admission: RE | Admit: 2017-06-28 | Discharge: 2017-06-28 | Disposition: A | Discharge status: Home / Self Care | Payer: PPO | Source: Ambulatory Visit | Attending: Internal Medicine | Admitting: Internal Medicine

## 2017-06-28 DIAGNOSIS — Z1231 Encounter for screening mammogram for malignant neoplasm of breast: Secondary | ICD-10-CM | POA: Insufficient documentation

## 2017-08-05 DIAGNOSIS — E6609 Other obesity due to excess calories: Secondary | ICD-10-CM | POA: Diagnosis not present

## 2017-08-05 DIAGNOSIS — J069 Acute upper respiratory infection, unspecified: Secondary | ICD-10-CM | POA: Diagnosis not present

## 2017-08-05 DIAGNOSIS — Z6833 Body mass index (BMI) 33.0-33.9, adult: Secondary | ICD-10-CM | POA: Diagnosis not present

## 2017-08-05 DIAGNOSIS — N390 Urinary tract infection, site not specified: Secondary | ICD-10-CM | POA: Diagnosis not present

## 2017-10-25 DIAGNOSIS — N342 Other urethritis: Secondary | ICD-10-CM | POA: Diagnosis not present

## 2017-10-25 DIAGNOSIS — Z6834 Body mass index (BMI) 34.0-34.9, adult: Secondary | ICD-10-CM | POA: Diagnosis not present

## 2017-10-25 DIAGNOSIS — R350 Frequency of micturition: Secondary | ICD-10-CM | POA: Diagnosis not present

## 2017-10-25 DIAGNOSIS — R319 Hematuria, unspecified: Secondary | ICD-10-CM | POA: Diagnosis not present

## 2017-11-23 DIAGNOSIS — E6609 Other obesity due to excess calories: Secondary | ICD-10-CM | POA: Diagnosis not present

## 2017-11-23 DIAGNOSIS — Z23 Encounter for immunization: Secondary | ICD-10-CM | POA: Diagnosis not present

## 2017-11-23 DIAGNOSIS — R002 Palpitations: Secondary | ICD-10-CM | POA: Diagnosis not present

## 2017-11-23 DIAGNOSIS — Z6833 Body mass index (BMI) 33.0-33.9, adult: Secondary | ICD-10-CM | POA: Diagnosis not present

## 2017-11-23 DIAGNOSIS — E039 Hypothyroidism, unspecified: Secondary | ICD-10-CM | POA: Diagnosis not present

## 2017-12-02 DIAGNOSIS — L570 Actinic keratosis: Secondary | ICD-10-CM | POA: Diagnosis not present

## 2017-12-02 DIAGNOSIS — L814 Other melanin hyperpigmentation: Secondary | ICD-10-CM | POA: Diagnosis not present

## 2017-12-02 DIAGNOSIS — L304 Erythema intertrigo: Secondary | ICD-10-CM | POA: Diagnosis not present

## 2017-12-02 DIAGNOSIS — L821 Other seborrheic keratosis: Secondary | ICD-10-CM | POA: Diagnosis not present

## 2017-12-23 ENCOUNTER — Encounter: Payer: Self-pay | Admitting: Cardiology

## 2017-12-23 ENCOUNTER — Ambulatory Visit: Payer: PPO | Admitting: Cardiology

## 2017-12-23 VITALS — BP 142/84 | HR 79 | Ht 67.0 in | Wt 228.0 lb

## 2017-12-23 DIAGNOSIS — E039 Hypothyroidism, unspecified: Secondary | ICD-10-CM | POA: Diagnosis not present

## 2017-12-23 DIAGNOSIS — R002 Palpitations: Secondary | ICD-10-CM | POA: Diagnosis not present

## 2017-12-23 DIAGNOSIS — R072 Precordial pain: Secondary | ICD-10-CM | POA: Diagnosis not present

## 2017-12-23 NOTE — Progress Notes (Signed)
Cardiology Office Note  Date: 12/23/2017   ID: Kristi Weiss, DOB [birth year], MRN [id number]  PCP: Redmond School, MD  Consulting Cardiologist: Rozann Lesches, MD   Chief Complaint  Patient presents with  . Palpitations    History of Present Illness: Kristi Weiss is a 72 y.o. female referred for cardiology consultation by Dr. Gerarda Fraction for evaluation of palpitations.  Referral information indicated chest pain, however she describes a fluttering sensation in her chest that has been sporadic over the last few months.  She does not report any exertional chest pain or breathlessness.  She states that she has been under a lot of stress and has wondered whether this is related.  Based on record review she was evaluated by Dr. Hamilton Capri with the [facility] practice back in April 2017 and at that time had a normal stress echocardiogram.  She has not had any documented arrhythmias but has not worn a cardiac monitor as yet.  I rersonally reviewed her ECG today which shows sinus rhythm with decreased R wave progression.  Medications are outlined below, none are recently added.  TSH normal.  Past Medical History:  Diagnosis Date  . GERD (gastroesophageal reflux disease)   . Hypothyroidism   . Scoliosis   . Sinusitis     Past Surgical History:  Procedure Laterality Date  . No prior surgery      Current Outpatient Medications  Medication Sig Dispense Refill  . Esomeprazole Magnesium (NEXIUM PO) Take 20 mg by mouth daily.     . Levothyroxine Sodium (SYNTHROID PO) Take 50 mcg by mouth.      No current facility-administered medications for this visit.    Allergies:  Penicillins   Social History: The patient  reports that she has never smoked. She has never used smokeless tobacco. She reports that she does not drink alcohol or use drugs.   Family History: The patient's family history includes CVA in her brother; Diabetes in her brother and maternal grandmother; Heart attack in her  paternal grandfather; Lung cancer in her father and mother.   ROS:  Please see the history of present illness. Otherwise, complete review of systems is positive for none.  All other systems are reviewed and negative.   Physical Exam: VS:  BP (!) 142/84 (BP Location: Right Arm)   Pulse 79   Ht 5\' 7"  (1.702 m)   Wt 228 lb (103.4 kg)   SpO2 97%   BMI 35.71 kg/m , BMI Body mass index is 35.71 kg/m.  Wt Readings from Last 3 Encounters:  12/23/17 228 lb (103.4 kg)  06/29/13 234 lb (106.1 kg)  05/18/13 234 lb (106.1 kg)    General: Overweight woman, appears comfortable at rest. HEENT: Conjunctiva and lids normal, oropharynx clear. Neck: Supple, no elevated JVP or carotid bruits, no thyromegaly. Lungs: Clear to auscultation, nonlabored breathing at rest. Cardiac: Regular rate and rhythm, no S3 or significant systolic murmur. Abdomen: Soft, nontender, bowel sounds present. Extremities: No pitting edema, distal pulses 2+. Skin: Warm and dry. Musculoskeletal: No kyphosis. Neuropsychiatric: Alert and oriented x3, affect grossly appropriate.  ECG: There is no old tracing available today for review.  Recent Labwork:  September 2019: TSH 2.63  Assessment and Plan:  1.  Intermittent palpitations.  She has had no chest pain or syncope with the symptoms, no previously documented arrhythmia.  I reviewed her recent tracings.  Plan at this time is to obtain a 30-day event recorder for further investigation.  She is not aware  of any specific precipitant.  We will also obtain an echocardiogram.  2.  Hypothyroidism, on Synthroid with recent normal TSH.  Current medicines were reviewed with the patient today.   Orders Placed This Encounter  Procedures  . Cardiac event monitor  . EKG 12-Lead  . ECHOCARDIOGRAM COMPLETE    Disposition: Call with test results.  Signed, Satira Sark, MD, [facility] 12/23/2017 3:24 PM    [facility] [address],  [place], [place] [address] Phone: [phone]; Fax: [phone]

## 2017-12-23 NOTE — Patient Instructions (Signed)
Your physician recommends that you schedule a follow-up appointment in:  We will call you with results    Your physician has requested that you have an echocardiogram. Echocardiography is a painless test that uses sound waves to create images of your heart. It provides your doctor with information about the size and shape of your heart and how well your heart's chambers and valves are working. This procedure takes approximately one hour. There are no restrictions for this procedure.     Your physician has recommended that you wear an event monitor for 30 days. Event monitors are medical devices that record the heart's electrical activity. Doctors most often [place] these monitors to diagnose arrhythmias. Arrhythmias are problems with the speed or rhythm of the heartbeat. The monitor is a small, portable device. You can wear one while you do your normal daily activities. This is usually used to diagnose what is causing palpitations/syncope (passing out).   Your physician recommends that you continue on your current medications as directed. Please refer to the Current Medication list given to you today.     No lab work today        Thank you for [place] !

## 2017-12-29 ENCOUNTER — Ambulatory Visit (HOSPITAL_COMMUNITY)
Admission: RE | Admit: 2017-12-29 | Discharge: 2017-12-29 | Disposition: A | Discharge status: Home / Self Care | Payer: PPO | Source: Ambulatory Visit | Attending: Cardiology | Admitting: Cardiology

## 2017-12-29 DIAGNOSIS — R072 Precordial pain: Secondary | ICD-10-CM | POA: Insufficient documentation

## 2017-12-29 DIAGNOSIS — E039 Hypothyroidism, unspecified: Secondary | ICD-10-CM | POA: Insufficient documentation

## 2017-12-29 DIAGNOSIS — R002 Palpitations: Secondary | ICD-10-CM | POA: Insufficient documentation

## 2017-12-29 DIAGNOSIS — I517 Cardiomegaly: Secondary | ICD-10-CM | POA: Insufficient documentation

## 2017-12-29 DIAGNOSIS — K219 Gastro-esophageal reflux disease without esophagitis: Secondary | ICD-10-CM | POA: Diagnosis not present

## 2017-12-29 NOTE — Progress Notes (Signed)
*  PRELIMINARY RESULTS* Echocardiogram 2D Echocardiogram has been performed.  Kristi Weiss 12/29/2017, 1:38 PM

## 2017-12-30 ENCOUNTER — Ambulatory Visit (INDEPENDENT_AMBULATORY_CARE_PROVIDER_SITE_OTHER): Payer: PPO

## 2017-12-30 DIAGNOSIS — R002 Palpitations: Secondary | ICD-10-CM | POA: Diagnosis not present

## 2018-01-20 DIAGNOSIS — J019 Acute sinusitis, unspecified: Secondary | ICD-10-CM | POA: Diagnosis not present

## 2018-01-20 DIAGNOSIS — Z6833 Body mass index (BMI) 33.0-33.9, adult: Secondary | ICD-10-CM | POA: Diagnosis not present

## 2018-01-20 DIAGNOSIS — R7309 Other abnormal glucose: Secondary | ICD-10-CM | POA: Diagnosis not present

## 2018-01-20 DIAGNOSIS — M419 Scoliosis, unspecified: Secondary | ICD-10-CM | POA: Diagnosis not present

## 2018-01-20 DIAGNOSIS — E6609 Other obesity due to excess calories: Secondary | ICD-10-CM | POA: Diagnosis not present

## 2018-03-16 DIAGNOSIS — D099 Carcinoma in situ, unspecified: Secondary | ICD-10-CM

## 2018-03-16 DIAGNOSIS — D0439 Carcinoma in situ of skin of other parts of face: Secondary | ICD-10-CM | POA: Diagnosis not present

## 2018-03-16 DIAGNOSIS — L570 Actinic keratosis: Secondary | ICD-10-CM | POA: Diagnosis not present

## 2018-03-16 DIAGNOSIS — L821 Other seborrheic keratosis: Secondary | ICD-10-CM | POA: Diagnosis not present

## 2018-03-16 DIAGNOSIS — L814 Other melanin hyperpigmentation: Secondary | ICD-10-CM | POA: Diagnosis not present

## 2018-03-16 HISTORY — DX: Carcinoma in situ, unspecified: D09.9

## 2018-05-03 DIAGNOSIS — Z1211 Encounter for screening for malignant neoplasm of colon: Secondary | ICD-10-CM | POA: Diagnosis not present

## 2018-05-12 DIAGNOSIS — D0439 Carcinoma in situ of skin of other parts of face: Secondary | ICD-10-CM | POA: Diagnosis not present

## 2018-07-01 DIAGNOSIS — E6609 Other obesity due to excess calories: Secondary | ICD-10-CM | POA: Diagnosis not present

## 2018-07-01 DIAGNOSIS — M1991 Primary osteoarthritis, unspecified site: Secondary | ICD-10-CM | POA: Diagnosis not present

## 2018-07-01 DIAGNOSIS — Z6833 Body mass index (BMI) 33.0-33.9, adult: Secondary | ICD-10-CM | POA: Diagnosis not present

## 2018-07-01 DIAGNOSIS — E7849 Other hyperlipidemia: Secondary | ICD-10-CM | POA: Diagnosis not present

## 2018-07-01 DIAGNOSIS — J329 Chronic sinusitis, unspecified: Secondary | ICD-10-CM | POA: Diagnosis not present

## 2018-07-01 DIAGNOSIS — Z1389 Encounter for screening for other disorder: Secondary | ICD-10-CM | POA: Diagnosis not present

## 2018-07-01 DIAGNOSIS — E063 Autoimmune thyroiditis: Secondary | ICD-10-CM | POA: Diagnosis not present

## 2018-07-01 DIAGNOSIS — Z0001 Encounter for general adult medical examination with abnormal findings: Secondary | ICD-10-CM | POA: Diagnosis not present

## 2018-08-09 DIAGNOSIS — M810 Age-related osteoporosis without current pathological fracture: Secondary | ICD-10-CM | POA: Diagnosis not present

## 2018-08-09 DIAGNOSIS — E7849 Other hyperlipidemia: Secondary | ICD-10-CM | POA: Diagnosis not present

## 2018-08-09 DIAGNOSIS — E063 Autoimmune thyroiditis: Secondary | ICD-10-CM | POA: Diagnosis not present

## 2018-08-09 DIAGNOSIS — J302 Other seasonal allergic rhinitis: Secondary | ICD-10-CM | POA: Diagnosis not present

## 2018-08-10 ENCOUNTER — Other Ambulatory Visit (HOSPITAL_COMMUNITY): Payer: Self-pay | Admitting: Internal Medicine

## 2018-08-10 DIAGNOSIS — Z1231 Encounter for screening mammogram for malignant neoplasm of breast: Secondary | ICD-10-CM

## 2018-08-12 DIAGNOSIS — Z1159 Encounter for screening for other viral diseases: Secondary | ICD-10-CM | POA: Diagnosis not present

## 2018-09-13 DIAGNOSIS — E6609 Other obesity due to excess calories: Secondary | ICD-10-CM | POA: Diagnosis not present

## 2018-09-13 DIAGNOSIS — Z6833 Body mass index (BMI) 33.0-33.9, adult: Secondary | ICD-10-CM | POA: Diagnosis not present

## 2018-09-13 DIAGNOSIS — J312 Chronic pharyngitis: Secondary | ICD-10-CM | POA: Diagnosis not present

## 2018-10-25 DIAGNOSIS — Z6833 Body mass index (BMI) 33.0-33.9, adult: Secondary | ICD-10-CM | POA: Diagnosis not present

## 2018-10-25 DIAGNOSIS — J329 Chronic sinusitis, unspecified: Secondary | ICD-10-CM | POA: Diagnosis not present

## 2018-10-25 DIAGNOSIS — I10 Essential (primary) hypertension: Secondary | ICD-10-CM | POA: Diagnosis not present

## 2018-10-25 DIAGNOSIS — S99822A Other specified injuries of left foot, initial encounter: Secondary | ICD-10-CM | POA: Diagnosis not present

## 2018-10-25 DIAGNOSIS — K219 Gastro-esophageal reflux disease without esophagitis: Secondary | ICD-10-CM | POA: Diagnosis not present

## 2018-11-02 ENCOUNTER — Other Ambulatory Visit: Payer: Self-pay | Admitting: Internal Medicine

## 2018-11-02 ENCOUNTER — Other Ambulatory Visit (HOSPITAL_COMMUNITY): Payer: Self-pay | Admitting: Internal Medicine

## 2018-11-02 DIAGNOSIS — R519 Headache, unspecified: Secondary | ICD-10-CM

## 2018-11-21 ENCOUNTER — Ambulatory Visit (HOSPITAL_COMMUNITY)
Admission: RE | Admit: 2018-11-21 | Discharge: 2018-11-21 | Disposition: A | Discharge status: Home / Self Care | Payer: PPO | Source: Ambulatory Visit | Attending: Internal Medicine | Admitting: Internal Medicine

## 2018-11-21 ENCOUNTER — Other Ambulatory Visit: Payer: Self-pay

## 2018-11-21 DIAGNOSIS — R51 Headache: Secondary | ICD-10-CM | POA: Insufficient documentation

## 2018-11-21 DIAGNOSIS — R519 Headache, unspecified: Secondary | ICD-10-CM

## 2018-12-12 ENCOUNTER — Other Ambulatory Visit: Payer: Self-pay

## 2018-12-12 ENCOUNTER — Ambulatory Visit (INDEPENDENT_AMBULATORY_CARE_PROVIDER_SITE_OTHER): Payer: PPO | Admitting: Otolaryngology

## 2018-12-12 DIAGNOSIS — J343 Hypertrophy of nasal turbinates: Secondary | ICD-10-CM | POA: Diagnosis not present

## 2018-12-12 DIAGNOSIS — J342 Deviated nasal septum: Secondary | ICD-10-CM | POA: Diagnosis not present

## 2018-12-12 DIAGNOSIS — K219 Gastro-esophageal reflux disease without esophagitis: Secondary | ICD-10-CM

## 2018-12-12 DIAGNOSIS — R519 Headache, unspecified: Secondary | ICD-10-CM | POA: Diagnosis not present

## 2019-01-04 DIAGNOSIS — E6609 Other obesity due to excess calories: Secondary | ICD-10-CM | POA: Diagnosis not present

## 2019-01-04 DIAGNOSIS — M7580 Other shoulder lesions, unspecified shoulder: Secondary | ICD-10-CM | POA: Diagnosis not present

## 2019-01-04 DIAGNOSIS — Z6833 Body mass index (BMI) 33.0-33.9, adult: Secondary | ICD-10-CM | POA: Diagnosis not present

## 2019-01-18 DIAGNOSIS — M7580 Other shoulder lesions, unspecified shoulder: Secondary | ICD-10-CM | POA: Diagnosis not present

## 2019-01-18 DIAGNOSIS — Z6833 Body mass index (BMI) 33.0-33.9, adult: Secondary | ICD-10-CM | POA: Diagnosis not present

## 2019-01-18 DIAGNOSIS — E063 Autoimmune thyroiditis: Secondary | ICD-10-CM | POA: Diagnosis not present

## 2019-02-06 ENCOUNTER — Ambulatory Visit (INDEPENDENT_AMBULATORY_CARE_PROVIDER_SITE_OTHER): Payer: PPO | Admitting: Otolaryngology

## 2019-02-06 ENCOUNTER — Other Ambulatory Visit: Payer: Self-pay

## 2019-03-03 DIAGNOSIS — C099 Malignant neoplasm of tonsil, unspecified: Secondary | ICD-10-CM

## 2019-03-03 HISTORY — DX: Malignant neoplasm of tonsil, unspecified: C09.9

## 2019-03-23 DIAGNOSIS — Z6833 Body mass index (BMI) 33.0-33.9, adult: Secondary | ICD-10-CM | POA: Diagnosis not present

## 2019-03-23 DIAGNOSIS — Z1389 Encounter for screening for other disorder: Secondary | ICD-10-CM | POA: Diagnosis not present

## 2019-03-23 DIAGNOSIS — K219 Gastro-esophageal reflux disease without esophagitis: Secondary | ICD-10-CM | POA: Diagnosis not present

## 2019-03-23 DIAGNOSIS — E7849 Other hyperlipidemia: Secondary | ICD-10-CM | POA: Diagnosis not present

## 2019-03-23 DIAGNOSIS — E039 Hypothyroidism, unspecified: Secondary | ICD-10-CM | POA: Diagnosis not present

## 2019-03-23 DIAGNOSIS — E063 Autoimmune thyroiditis: Secondary | ICD-10-CM | POA: Diagnosis not present

## 2019-03-23 DIAGNOSIS — J302 Other seasonal allergic rhinitis: Secondary | ICD-10-CM | POA: Diagnosis not present

## 2019-03-23 DIAGNOSIS — Z0000 Encounter for general adult medical examination without abnormal findings: Secondary | ICD-10-CM | POA: Diagnosis not present

## 2019-03-23 DIAGNOSIS — M419 Scoliosis, unspecified: Secondary | ICD-10-CM | POA: Diagnosis not present

## 2019-03-23 DIAGNOSIS — I10 Essential (primary) hypertension: Secondary | ICD-10-CM | POA: Diagnosis not present

## 2019-04-02 DIAGNOSIS — E063 Autoimmune thyroiditis: Secondary | ICD-10-CM | POA: Diagnosis not present

## 2019-04-02 DIAGNOSIS — E7849 Other hyperlipidemia: Secondary | ICD-10-CM | POA: Diagnosis not present

## 2019-04-02 DIAGNOSIS — I10 Essential (primary) hypertension: Secondary | ICD-10-CM | POA: Diagnosis not present

## 2019-04-04 NOTE — Progress Notes (Signed)
[id number]    Provider:  Dr Jaynee Eagles Requesting Provider: Leta Baptist, MD Primary Care Provider:  Redmond School, MD  CC:  Daily headache  HPI:  Kristi Weiss is a 74 y.o. female here as requested by Leta Baptist, MD for daily headaches.  She has a past medical history of Scoliosis, hypothyroid, arthritis.  I reviewed Dr. Benjamine Mola, Su, MD's notes: Patient has headaches, she was seen by this provider as requested by Dr. Carolin Sicks ENT specially, daily headaches for many years, always attributed headaches to her sinuses, only short-term relief when taking antibiotics, recent CT showed minimal left maxillary mucosal edema, she also complains of postnasal drainage causing throat discomfort and frequent throat clearing, no dysphagia, she quit smoking over 20 years prior, no alcohol or illegal drugs she is a widow.  I reviewed his examination which included physical exam, focused neurologic exam, ear nose and throat, neuro grossly normal, gait normal, no nystagmus, diagnosed with chronic rhinitis without evidence of acute sinusitis, mild nasal mucosal congestion is noted with septal deviation and turbinate-Per trophy, moderate posterior laryngeal edema consistent with reflux, postnasal drip, sinuses not the cause of her headaches and she was referred for further evaluation. She has been having headaches "on and off" forever and can last for days at a time. Thy are more facial like she has sinus headaches. Her eyes hurt like pressure in her eyes and the mid face behind the nose, she has no increased sensitivity to light during the headaches , no nausea, constant pressure than pounding. Also in the temples but really behind the face and midface and behind the nose. They can be different intensities, they can be so intense she has to be still and not move, she has headaches every day, she often wakes up with headaches, getting up made it feel better. She is feeling stress and sitting on the couch  a lot due to covid. More than 2 years daily headaches, she takes ibuprofen when it gets severe. No hx of headaches or migraines as far as she knows. She gets blurry vision, she can wake with severe headaches. She feels like she is in a fog. She has fatigue.   Reviewed notes, labs and imaging from outside physicians, which showed:  See above  Review of Systems: Patient complains of symptoms per HPI as well as the following symptoms: drainage, headache. Pertinent negatives and positives per HPI. All others negative.   Social History   Socioeconomic History  . Marital status: Married    Spouse name: Not on file  . Number of children: 1  . Years of education: Not on file  . Highest education level: Not on file  Occupational History  . Not on file  Tobacco Use  . Smoking status: Former Research scientist (life sciences)  . Smokeless tobacco: Never Used  . Tobacco comment: some in her 14s and 30s  Substance and Sexual Activity  . Alcohol use: Yes    Comment: on rare occasion  . Drug use: Never  . Sexual activity: Not on file  Other Topics Concern  . Not on file  Social History Narrative   Lives at home alone   Retired   Widow   Caffeine: 3 cups coffee, 3 glasses of tea daily   Social Determinants of Health   Financial Resource Strain:   . Difficulty of Paying Living Expenses: Not on file  Food Insecurity:   . Worried About Charity fundraiser in the Last Year: Not on file  .  Ran Out of Food in the Last Year: Not on file  Transportation Needs:   . Lack of Transportation (Medical): Not on file  . Lack of Transportation (Non-Medical): Not on file  Physical Activity:   . Days of Exercise per Week: Not on file  . Minutes of Exercise per Session: Not on file  Stress:   . Feeling of Stress : Not on file  Social Connections:   . Frequency of Communication with Friends and Family: Not on file  . Frequency of Social Gatherings with Friends and Family: Not on file  . Attends Religious Services: Not on file   . Active Member of Clubs or Organizations: Not on file  . Attends Archivist Meetings: Not on file  . Marital Status: Not on file  Intimate Partner Violence:   . Fear of Current or Ex-Partner: Not on file  . Emotionally Abused: Not on file  . Physically Abused: Not on file  . Sexually Abused: Not on file    Family History  Problem Relation Age of Onset  . Lung cancer Mother        smoker  . Lung cancer Father        smoker  . Diabetes Father   . CVA Brother   . Diabetes Brother   . Diabetes Maternal Grandmother   . Heart attack Paternal Grandfather   . Migraines Neg Hx   . Headache Neg Hx     Past Medical History:  Diagnosis Date  . Arthritis   . GERD (gastroesophageal reflux disease)   . Hypothyroidism   . 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 Scoliosis   . Sinusitis   . 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    Patient Active Problem List   Diagnosis Date Noted  . Difficulty in walking(719.7) 05/22/2013  . Arthritis of knee, degenerative 05/18/2013    Past Surgical History:  Procedure Laterality Date  . NO PAST SURGERIES    . No prior surgery      Current Outpatient Medications  Medication Sig Dispense Refill  . Esomeprazole Magnesium (NEXIUM PO) Take 20 mg by mouth daily.     . Ibuprofen 200 MG CAPS Take by mouth as needed.    . Levothyroxine Sodium (SYNTHROID PO) Take 50 mcg by mouth daily. BRAND    . sucralfate (CARAFATE) 1 g tablet Take 1 g by mouth at bedtime.     No current facility-administered medications for this visit.    Allergies as of 04/05/2019 - Review Complete 04/05/2019  Allergen Reaction Noted  . Sulfamethoxazole-trimethoprim Other (See Comments) 06/04/2015  . Penicillins Rash 05/18/2013     Vitals: BP (!) 157/89 (BP Location: Left Arm, Patient Position: Sitting)   Pulse 74   Temp 97.8 F (36.6 C)   Ht 5\' 8"  (1.727 m)   Wt 220 lb (99.8 kg)   BMI 33.45 kg/m  Last Weight:  Wt Readings from Last 1 Encounters:  04/05/19 220 lb (99.8 kg)   Last Height:   Ht Readings from Last 1 Encounters:  04/05/19 5\' 8"  (1.727 m)     Physical exam: Exam: Gen: NAD, conversant, well nourised, obese, well groomed  CV: RRR, no MRG. No Carotid Bruits. No peripheral edema, warm, nontender Eyes: Conjunctivae clear without exudates or hemorrhage  Neuro: Detailed Neurologic Exam  Speech:    Speech is normal; fluent and spontaneous with normal comprehension.  Cognition:    The patient is oriented to person, place, and time;     recent and remote memory intact;     language fluent;     normal attention, concentration,     fund of knowledge Cranial Nerves:    The pupils are equal, round, and reactive to light. Could not visualize fundi. Visual fields are full to finger confrontation. Extraocular movements are intact. Trigeminal sensation is intact and the muscles of mastication are normal. The face is symmetric. The palate elevates in the midline. Hearing intact. Voice is normal. Shoulder shrug is normal. The tongue has normal motion without fasciculations.   Coordination:    Normal finger to nose   Gait:    Normal native gait  Motor Observation:    No asymmetry, no atrophy, and no involuntary movements noted. Tone:    Normal muscle tone.    Posture:    Posture is normal. normal erect    Strength:    Strength is V/V in the upper and lower limbs.      Sensation: intact to LT     Reflex Exam:  DTR's:    Deep tendon reflexes in the upper and lower extremities are normal bilaterally.   Toes:    The toes are downgoing bilaterally.   Clonus:    Clonus is absent.    Assessment/Plan:  Really nice 74 year old with daily chronic headaches, doesn't  really follow a pattern that is recognizable as a primary headache disorder, not migraines, no autonomic features, pressure mostly in the maxillary and ophthalmic areas of the face bilaterally.   - Need MRi of the brain given some concerning symptoms of: MRI brain due to concerning symptoms of morning headaches, positional headaches,vision changes , ringing in the ears to look for space occupying mass, chiari or intracranial hypertension (pseudotumor). Also given interesting significant pressure and pain behind the eyes need MRI orbits to look for cavernous sinus pathology, craniopharyngioma or other lesion to cause mid face and eye pain/pressure.  - Unusual headache, she has pain mostly pressure in the middle face behind the eyes and nose and sinuses however she has no sinus disease. She also has drainage they cannot determine the cause of. She has positional headache can wake with severe headaches in the morning and can get better after getting up. But she hs daily intractable headaches, no significant migrainous features or features of other primary headaches other than maybe similar to tension-type headace.   - Has chronic idiopathic drainage, may consider a csf leak. After MRi if neg may consider flow studies. If neg here as well may consider ENT t [facility] or [facility].   - Has not had eyes checked since 2019, recommend checking to make sure pressure(ie glaucoma) is ok or other etiology especially given eye pain and pressure  - Can consider sleep study if all else negative or treating for headache/migraine with medication. But ESS is 4 and FSS is 26, no significant symptoms of OSA but has had morning very bad headaches can send if no other findings, discuss with Dr. Brett Fairy first   Discussed: To prevent or relieve headaches, try the following: Cool Compress. Lie down and place a cool compress on your head.  Avoid headache triggers. If certain foods or  odors seem to have triggered your migraines in the  past, avoid them. A headache diary might help you identify triggers.  Include physical activity in your daily routine. Try a daily walk or other moderate aerobic exercise.  Manage stress. Find healthy ways to cope with the stressors, such as delegating tasks on your to-do list.  Practice relaxation techniques. Try deep breathing, yoga, massage and visualization.  Eat regularly. Eating regularly scheduled meals and maintaining a healthy diet might help prevent headaches. Also, drink plenty of fluids.  Follow a regular sleep schedule. Sleep deprivation might contribute to headaches Consider biofeedback. With this mind-body technique, you learn to control certain bodily functions -- such as muscle tension, heart rate and blood pressure -- to prevent headaches or reduce headache pain.    Proceed to emergency room if you experience new or worsening symptoms or symptoms do not resolve, if you have new neurologic symptoms or if headache is severe, or for any concerning symptom.   Provided education and documentation from American headache Society toolbox including articles on: chronic migraine medication overuse headache, chronic migraines, prevention of migraines, behavioral and other nonpharmacologic treatments for headache.    Orders Placed This Encounter  Procedures  . MR BRAIN W WO CONTRAST  . MR ORBITS W WO CONTRAST  . Basic Metabolic Panel   No orders of the defined types were placed in this encounter.   Cc: Redmond School, MD,   Leta Baptist, MD  Sarina Ill, MD  [facility] [address] [place],  [address]  Phone [phone] Fax [phone]

## 2019-04-05 ENCOUNTER — Telehealth: Payer: Self-pay | Admitting: Neurology

## 2019-04-05 ENCOUNTER — Other Ambulatory Visit: Payer: Self-pay

## 2019-04-05 ENCOUNTER — Ambulatory Visit: Payer: PPO | Admitting: Neurology

## 2019-04-05 ENCOUNTER — Encounter: Payer: Self-pay | Admitting: Neurology

## 2019-04-05 VITALS — BP 157/89 | HR 74 | Temp 97.8°F | Ht 68.0 in | Wt 220.0 lb

## 2019-04-05 DIAGNOSIS — R519 Headache, unspecified: Secondary | ICD-10-CM

## 2019-04-05 DIAGNOSIS — G441 Vascular headache, not elsewhere classified: Secondary | ICD-10-CM

## 2019-04-05 DIAGNOSIS — H5713 Ocular pain, bilateral: Secondary | ICD-10-CM | POA: Diagnosis not present

## 2019-04-05 DIAGNOSIS — G8929 Other chronic pain: Secondary | ICD-10-CM

## 2019-04-05 DIAGNOSIS — R51 Headache with orthostatic component, not elsewhere classified: Secondary | ICD-10-CM | POA: Diagnosis not present

## 2019-04-05 DIAGNOSIS — R410 Disorientation, unspecified: Secondary | ICD-10-CM | POA: Diagnosis not present

## 2019-04-05 DIAGNOSIS — H539 Unspecified visual disturbance: Secondary | ICD-10-CM

## 2019-04-05 NOTE — Patient Instructions (Addendum)
MRI of the brain and orbits Sleep evaluation?  General Headache Without Cause A headache is pain or discomfort that is felt around the head or neck area. There are many causes and types of headaches. In some cases, the cause may not be found. Follow these instructions at home: Watch your condition for any changes. Let your doctor know about them. Take these steps to help with your condition: Managing pain      Take over-the-counter and prescription medicines only as told by your doctor.  Lie down in a dark, quiet room when you have a headache.  If told, put ice on your head and neck area: ? Put ice in a plastic bag. ? Place a towel between your skin and the bag. ? Leave the ice on for 20 minutes, 2-3 times per day.  If told, put heat on the affected area. Use the heat source that your doctor recommends, such as a moist heat pack or a heating pad. ? Place a towel between your skin and the heat source. ? Leave the heat on for 20-30 minutes. ? Remove the heat if your skin turns bright red. This is very important if you are unable to feel pain, heat, or cold. You may have a greater risk of getting burned.  Keep lights dim if bright lights bother you or make your headaches worse. Eating and drinking  Eat meals on a regular schedule.  If you drink alcohol: ? Limit how much you use to:  0-1 drink a day for women.  0-2 drinks a day for men. ? Be aware of how much alcohol is in your drink. In the [place] one drink equals one 12 oz bottle of beer (355 mL), one 5 oz glass of wine (148 mL), or one 1 oz glass of hard liquor (44 mL).  Stop drinking caffeine, or reduce how much caffeine you drink. General instructions   Keep a journal to find out if certain things bring on headaches. For example, write down: ? What you eat and drink. ? How much sleep you get. ? Any change to your diet or medicines.  Get a massage or try other ways to relax.  Limit stress.  Sit up straight. Do not  tighten (tense) your muscles.  Do not use any products that contain nicotine or tobacco. This includes cigarettes, e-cigarettes, and chewing tobacco. If you need help quitting, ask your doctor.  Exercise regularly as told by your doctor.  Get enough sleep. This often means 7-9 hours of sleep each night.  Keep all follow-up visits as told by your doctor. This is important. Contact a doctor if:  Your symptoms are not helped by medicine.  You have a headache that feels different than the other headaches.  You feel sick to your stomach (nauseous) or you throw up (vomit).  You have a fever. Get help right away if:  Your headache gets very bad quickly.  Your headache gets worse after a lot of physical activity.  You keep throwing up.  You have a stiff neck.  You have trouble seeing.  You have trouble speaking.  You have pain in the eye or ear.  Your muscles are weak or you lose muscle control.  You lose your balance or have trouble walking.  You feel like you will pass out (faint) or you pass out.  You are mixed up (confused).  You have a seizure. Summary  A headache is pain or discomfort that is felt around the head  or neck area.  There are many causes and types of headaches. In some cases, the cause may not be found.  Keep a journal to help find out what causes your headaches. Watch your condition for any changes. Let your doctor know about them.  Contact a doctor if you have a headache that is different from usual, or if your headache is not helped by medicine.  Get help right away if your headache gets very bad, you throw up, you have trouble seeing, you lose your balance, or you have a seizure. This information is not intended to replace advice given to you by your health care provider. Make sure you discuss any questions you have with your health care provider. Document Revised: 09/06/2017 Document Reviewed: 09/06/2017 Elsevier Patient Education  [place].

## 2019-04-05 NOTE — Telephone Encounter (Signed)
health team order sent to GI. No auth they will reach out to the patient to schedule.  

## 2019-04-06 DIAGNOSIS — L570 Actinic keratosis: Secondary | ICD-10-CM | POA: Diagnosis not present

## 2019-04-06 DIAGNOSIS — D485 Neoplasm of uncertain behavior of skin: Secondary | ICD-10-CM | POA: Diagnosis not present

## 2019-04-06 DIAGNOSIS — B078 Other viral warts: Secondary | ICD-10-CM | POA: Diagnosis not present

## 2019-04-06 LAB — BASIC METABOLIC PANEL
BUN/Creatinine Ratio: 16 (ref 12–28)
BUN: 14 mg/dL (ref 8–27)
CO2: 27 mmol/L (ref 20–29)
Calcium: 9.8 mg/dL (ref 8.7–10.3)
Chloride: 101 mmol/L (ref 96–106)
Creatinine, Ser: 0.89 mg/dL (ref 0.57–1.00)
GFR calc Af Amer: 75 mL/min/{1.73_m2} (ref >59)
GFR calc non Af Amer: 65 mL/min/{1.73_m2} (ref >59)
Glucose: 95 mg/dL (ref 65–99)
Potassium: 5.2 mmol/L (ref 3.5–5.2)
Sodium: 142 mmol/L (ref 134–144)

## 2019-04-10 ENCOUNTER — Telehealth: Payer: Self-pay | Admitting: *Deleted

## 2019-04-10 NOTE — Telephone Encounter (Signed)
-----   Message from Melvenia Beam, MD sent at 04/06/2019  9:38 AM EST ----- Labs normal

## 2019-04-10 NOTE — Telephone Encounter (Signed)
Pt aware labs are normal. Her MRI is scheduled for 2/24 then f/u on 3/29. Pt verbalized appreciation for the call.

## 2019-04-11 DIAGNOSIS — Z0000 Encounter for general adult medical examination without abnormal findings: Secondary | ICD-10-CM | POA: Diagnosis not present

## 2019-04-11 DIAGNOSIS — Z6833 Body mass index (BMI) 33.0-33.9, adult: Secondary | ICD-10-CM | POA: Diagnosis not present

## 2019-04-11 DIAGNOSIS — Z1389 Encounter for screening for other disorder: Secondary | ICD-10-CM | POA: Diagnosis not present

## 2019-04-11 DIAGNOSIS — E7849 Other hyperlipidemia: Secondary | ICD-10-CM | POA: Diagnosis not present

## 2019-04-11 DIAGNOSIS — E6609 Other obesity due to excess calories: Secondary | ICD-10-CM | POA: Diagnosis not present

## 2019-04-26 ENCOUNTER — Other Ambulatory Visit: Payer: PPO

## 2019-04-30 DIAGNOSIS — E063 Autoimmune thyroiditis: Secondary | ICD-10-CM | POA: Diagnosis not present

## 2019-04-30 DIAGNOSIS — E7849 Other hyperlipidemia: Secondary | ICD-10-CM | POA: Diagnosis not present

## 2019-04-30 DIAGNOSIS — I10 Essential (primary) hypertension: Secondary | ICD-10-CM | POA: Diagnosis not present

## 2019-05-19 ENCOUNTER — Other Ambulatory Visit: Payer: Self-pay | Admitting: Family Medicine

## 2019-05-19 DIAGNOSIS — E6609 Other obesity due to excess calories: Secondary | ICD-10-CM | POA: Diagnosis not present

## 2019-05-19 DIAGNOSIS — R590 Localized enlarged lymph nodes: Secondary | ICD-10-CM | POA: Diagnosis not present

## 2019-05-19 DIAGNOSIS — Z6833 Body mass index (BMI) 33.0-33.9, adult: Secondary | ICD-10-CM | POA: Diagnosis not present

## 2019-05-19 DIAGNOSIS — R0982 Postnasal drip: Secondary | ICD-10-CM | POA: Diagnosis not present

## 2019-05-19 DIAGNOSIS — R519 Headache, unspecified: Secondary | ICD-10-CM | POA: Diagnosis not present

## 2019-05-19 DIAGNOSIS — R1310 Dysphagia, unspecified: Secondary | ICD-10-CM | POA: Diagnosis not present

## 2019-05-22 ENCOUNTER — Other Ambulatory Visit: Payer: Self-pay

## 2019-05-22 ENCOUNTER — Ambulatory Visit
Admission: RE | Admit: 2019-05-22 | Discharge: 2019-05-22 | Disposition: A | Discharge status: Home / Self Care | Payer: PPO | Source: Ambulatory Visit | Attending: Neurology | Admitting: Neurology

## 2019-05-22 DIAGNOSIS — R410 Disorientation, unspecified: Secondary | ICD-10-CM

## 2019-05-22 DIAGNOSIS — H5713 Ocular pain, bilateral: Secondary | ICD-10-CM

## 2019-05-22 DIAGNOSIS — R519 Headache, unspecified: Secondary | ICD-10-CM

## 2019-05-22 DIAGNOSIS — H539 Unspecified visual disturbance: Secondary | ICD-10-CM

## 2019-05-22 DIAGNOSIS — R51 Headache with orthostatic component, not elsewhere classified: Secondary | ICD-10-CM

## 2019-05-22 DIAGNOSIS — G441 Vascular headache, not elsewhere classified: Secondary | ICD-10-CM

## 2019-05-22 MED ORDER — GADOBENATE DIMEGLUMINE 529 MG/ML IV SOLN
20.0000 mL | Freq: Once | INTRAVENOUS | Status: AC | PRN
Start: 2019-05-22 — End: 2019-05-22
  Administered 2019-05-22: 20 mL via INTRAVENOUS

## 2019-05-24 ENCOUNTER — Telehealth: Payer: Self-pay | Admitting: *Deleted

## 2019-05-24 NOTE — Telephone Encounter (Signed)
Spoke with patient and informed her MRI of the brain and orbits were unremarkable, nothing very concerning and nothing to explain her headache.Reminded her she has follow up with Dr Kristi Weiss next Mon, and she will discuss with patient further then. Patient verbalized understanding, appreciation.

## 2019-05-25 ENCOUNTER — Other Ambulatory Visit: Payer: Self-pay

## 2019-05-25 ENCOUNTER — Ambulatory Visit (HOSPITAL_COMMUNITY)
Admission: RE | Admit: 2019-05-25 | Discharge: 2019-05-25 | Disposition: A | Discharge status: Home / Self Care | Payer: PPO | Source: Ambulatory Visit | Attending: Family Medicine | Admitting: Family Medicine

## 2019-05-25 DIAGNOSIS — R590 Localized enlarged lymph nodes: Secondary | ICD-10-CM | POA: Diagnosis not present

## 2019-05-25 DIAGNOSIS — R221 Localized swelling, mass and lump, neck: Secondary | ICD-10-CM | POA: Diagnosis not present

## 2019-05-29 ENCOUNTER — Ambulatory Visit: Payer: PPO | Admitting: Neurology

## 2019-05-29 ENCOUNTER — Other Ambulatory Visit: Payer: Self-pay

## 2019-05-29 ENCOUNTER — Encounter: Payer: Self-pay | Admitting: Neurology

## 2019-05-29 VITALS — BP 180/83 | HR 68 | Temp 97.4°F | Ht 67.0 in | Wt 213.0 lb

## 2019-05-29 DIAGNOSIS — G44229 Chronic tension-type headache, not intractable: Secondary | ICD-10-CM

## 2019-05-29 MED ORDER — NURTEC 75 MG PO TBDP: 75.0000 mg | ORAL_TABLET | Freq: Every day | ORAL | 0 refills | Status: DC | PRN

## 2019-05-29 NOTE — Progress Notes (Signed)
[facility]    Provider:  Dr Jaynee Eagles Requesting Provider: Leta Baptist, MD Primary Care Provider:  Redmond School, MD  CC:  Daily headache  She continues to have drainage and now she has a lymph node.  Her headaches are improved, she hasn't had many headaches recently, we did talk about preventative for her headaches however they really do not fit any primary headache disorder and given her drainage I wonder if these are all allergies.  She is seeing ear nose and throat soon and I encouraged her to discuss allergies with them and referral to an allergist.  I gave her some Nurtec samples to see if that would help when she has the headache.  We reviewed MRI images of the brain and the orbits which were both unremarkable, there was some atrophy which can be seen in aging and I do not think it is clinically relevant. RTC as needed.  HPI:  Kristi Weiss is a 74 y.o. female here as requested by Leta Baptist, MD for daily headaches.  She has a past medical history of Scoliosis, hypothyroid, arthritis.  I reviewed Dr. Benjamine Mola, Su, MD's notes: Patient has headaches, she was seen by this provider as requested by Dr. Carolin Sicks ENT specially, daily headaches for many years, always attributed headaches to her sinuses, only short-term relief when taking antibiotics, recent CT showed minimal left maxillary mucosal edema, she also complains of postnasal drainage causing throat discomfort and frequent throat clearing, no dysphagia, she quit smoking over 20 years prior, no alcohol or illegal drugs she is a widow.  I reviewed his examination which included physical exam, focused neurologic exam, ear nose and throat, neuro grossly normal, gait normal, no nystagmus, diagnosed with chronic rhinitis without evidence of acute sinusitis, mild nasal mucosal congestion is noted with septal deviation and turbinate-Per trophy, moderate posterior laryngeal edema consistent with reflux, postnasal drip, sinuses not  the cause of her headaches and she was referred for further evaluation. She has been having headaches "on and off" forever and can last for days at a time. Thy are more facial like she has sinus headaches. Her eyes hurt like pressure in her eyes and the mid face behind the nose, she has no increased sensitivity to light during the headaches , no nausea, constant pressure than pounding. Also in the temples but really behind the face and midface and behind the nose. They can be different intensities, they can be so intense she has to be still and not move, she has headaches every day, she often wakes up with headaches, getting up made it feel better. She is feeling stress and sitting on the couch a lot due to covid. More than 2 years daily headaches, she takes ibuprofen when it gets severe. No hx of headaches or migraines as far as she knows. She gets blurry vision, she can wake with severe headaches. She feels like she is in a fog. She has fatigue.   Reviewed notes, labs and imaging from outside physicians, which showed:  See above  Review of Systems:  Patient complains of symptoms per HPI as well as the following symptoms: She complains of drainage, improved headache, pertinent negatives and positives per HPI, all others negative.   Social History   Socioeconomic History  . Marital status: Widowed    Spouse name: Not on file  . Number of children: 1  . Years of education: Not on file  . Highest education level: Not on file  Occupational History  .  Not on file  Tobacco Use  . Smoking status: Former Research scientist (life sciences)  . Smokeless tobacco: Never Used  . Tobacco comment: some in her 57s and 30s  Substance and Sexual Activity  . Alcohol use: Yes    Comment: on rare occasion "like twice a year"  . Drug use: Never  . Sexual activity: Not on file  Other Topics Concern  . Not on file  Social History Narrative   Lives at home alone   Retired   Widow   Caffeine: about 4 cups coffee, 3 glasses of tea  daily   Social Determinants of Health   Financial Resource Strain:   . Difficulty of Paying Living Expenses:   Food Insecurity:   . Worried About Charity fundraiser in the Last Year:   . Arboriculturist in the Last Year:   Transportation Needs:   . Film/video editor (Medical):   Marland Kitchen Lack of Transportation (Non-Medical):   Physical Activity:   . Days of Exercise per Week:   . Minutes of Exercise per Session:   Stress:   . Feeling of Stress :   Social Connections:   . Frequency of Communication with Friends and Family:   . Frequency of Social Gatherings with Friends and Family:   . Attends Religious Services:   . Active Member of Clubs or Organizations:   . Attends Archivist Meetings:   Marland Kitchen Marital Status:   Intimate Partner Violence:   . Fear of Current or Ex-Partner:   . Emotionally Abused:   Marland Kitchen Physically Abused:   . Sexually Abused:     Family History  Problem Relation Age of Onset  . Lung cancer Mother        smoker  . Lung cancer Father        smoker  . Diabetes Father   . CVA Brother   . Diabetes Brother   . Diabetes Maternal Grandmother   . Heart attack Paternal Grandfather   . Migraines Neg Hx   . Headache Neg Hx     Past Medical History:  Diagnosis Date  . Arthritis   . GERD (gastroesophageal reflux disease)   . Hypothyroidism   . 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 Scoliosis   . Sinusitis   . 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    Patient Active Problem List   Diagnosis Date Noted  . Chronic tension-type headache, not intractable 05/30/2019  . Difficulty in walking(719.7) 05/22/2013  . Arthritis of knee, degenerative 05/18/2013    Past Surgical History:  Procedure Laterality Date  . NO PAST SURGERIES      . No prior surgery      Current Outpatient Medications  Medication Sig Dispense Refill  . Esomeprazole Magnesium (NEXIUM PO) Take 20 mg by mouth daily.     . Ibuprofen 200 MG CAPS Take by mouth as needed.    . Levothyroxine Sodium (SYNTHROID PO) Take 50 mcg by mouth daily. BRAND    . sucralfate (CARAFATE) 1 g tablet Take 1 g by mouth at bedtime.    . Rimegepant Sulfate (NURTEC) 75 MG TBDP Take 75 mg by mouth daily as needed. For migraines. Take as close to onset of migraine as possible. One daily  maximum. 4 tablet 0   No current facility-administered medications for this visit.    Allergies as of 05/29/2019 - Review Complete 05/29/2019  Allergen Reaction Noted  . Sulfamethoxazole-trimethoprim Other (See Comments) 06/04/2015  . Penicillins Rash 05/18/2013    Vitals: BP (!) 180/83 (BP Location: Left Arm, Patient Position: Sitting)   Pulse 68   Temp (!) 97.4 F (36.3 C) Comment: taken at front  Ht 5\' 7"  (1.702 m)   Wt 213 lb (96.6 kg)   BMI 33.36 kg/m  Last Weight:  Wt Readings from Last 1 Encounters:  05/29/19 213 lb (96.6 kg)   Last Height:   Ht Readings from Last 1 Encounters:  05/29/19 5\' 7"  (1.702 m)    Physical exam: Exam: Gen: NAD, conversant, well nourised, obese, well groomed                   Eyes: Conjunctivae clear without exudates or hemorrhage  Neuro: Detailed Neurologic Exam  Speech:    Speech is normal; fluent and spontaneous with normal comprehension.  Cognition:    The patient is oriented to person, place, and time;     recent and remote memory intact;     language fluent;     normal attention, concentration,     fund of knowledge Cranial Nerves:    The pupils are equal, round, and reactive to light.  Visual fields are full to finger confrontation. Extraocular movements are intact. Trigeminal sensation is intact and the muscles of mastication are normal. The face is symmetric. The palate elevates in the midline. Hearing intact. Voice is normal.  Shoulder shrug is normal. The tongue has normal motion without fasciculations.    Gait:   Normal gait  Motor Observation:    No asymmetry, no atrophy, and no involuntary movements noted. Tone:    Normal muscle tone.    Posture:    Posture is normal. normal erect    Strength:    Strength is V/V in the upper and lower limbs.      Sensation: intact to LT    Assessment/Plan:  Really nice 73 year old with daily chronic headaches, doesn't really follow a pattern that is recognizable as a primary headache disorder, not migraines, no autonomic features, pressure mostly in the maxillary and ophthalmic areas of the face bilaterally.  MRI of the brain and orbits were unremarkable, there was some atrophy which I do not think is clinically significant and some white matter changes again not clinically significant.  She has a lot of drainage and I wonder if a lot of this is due to allergies, she is seeing ear nose and throat in a few days, the headache has improved, I encouraged her to discuss allergies and possibly referral to an allergist as well.  I gave her some Nurtec samples, she can try that for acute management of her headaches see if that works.   - Can consider sleep study in the future or treating for headache/migraine with medication. But ESS is 4 and FSS is 26, no significant symptoms of OSA and headache improved, will monitor clinically.  Meds ordered this encounter  Medications  . Rimegepant Sulfate (NURTEC) 75 MG TBDP    Sig: Take 75 mg by mouth daily as needed. For migraines. Take as close to onset of migraine as possible. One daily maximum.    Dispense:  4 tablet    Refill:  0    Discussed: To prevent or relieve headaches, try the following: Cool Compress.  Lie down and place a cool compress on your head.  Avoid headache triggers. If certain foods or odors seem to have triggered your migraines in the past, avoid them. A headache diary might help you identify triggers.  Include  physical activity in your daily routine. Try a daily walk or other moderate aerobic exercise.  Manage stress. Find healthy ways to cope with the stressors, such as delegating tasks on your to-do list.  Practice relaxation techniques. Try deep breathing, yoga, massage and visualization.  Eat regularly. Eating regularly scheduled meals and maintaining a healthy diet might help prevent headaches. Also, drink plenty of fluids.  Follow a regular sleep schedule. Sleep deprivation might contribute to headaches Consider biofeedback. With this mind-body technique, you learn to control certain bodily functions -- such as muscle tension, heart rate and blood pressure -- to prevent headaches or reduce headache pain.    Proceed to emergency room if you experience new or worsening symptoms or symptoms do not resolve, if you have new neurologic symptoms or if headache is severe, or for any concerning symptom.   Provided education and documentation from American headache Society toolbox including articles on: chronic migraine medication overuse headache, chronic migraines, prevention of migraines, behavioral and other nonpharmacologic treatments for headache.    No orders of the defined types were placed in this encounter.  Meds ordered this encounter  Medications  . Rimegepant Sulfate (NURTEC) 75 MG TBDP    Sig: Take 75 mg by mouth daily as needed. For migraines. Take as close to onset of migraine as possible. One daily maximum.    Dispense:  4 tablet    Refill:  0    Cc: Leta Baptist, MD,   Leta Baptist, MD  Sarina Ill, MD  [facility] [address] [place], [place] [address]  Phone [phone] Fax [phone]  I spent 25 minutes of face-to-face and non-face-to-face time with patient on the  1. Chronic tension-type headache, not intractable    diagnosis.  This included previsit chart review, lab review, study review, order entry, electronic health record  documentation, patient education on the different diagnostic and therapeutic options, counseling and coordination of care, risks and benefits of management, compliance, or risk factor reduction

## 2019-05-29 NOTE — Patient Instructions (Addendum)
Nurtec once daily as needed    Allergies, Adult An allergy is when your body's defense system (immune system) overreacts to an otherwise harmless substance (allergen) that you breathe in or eat or something that touches your skin. When you come into contact with something that you are allergic to, your immune system produces certain proteins (antibodies). These proteins cause cells to release chemicals (histamines) that trigger the symptoms of an allergic reaction. Allergies often affect the nasal passages (allergic rhinitis), eyes (allergic conjunctivitis), skin (atopic dermatitis), and stomach. Allergies can be mild or severe. Allergies cannot spread from person to person (are not contagious). They can develop at any age and may be outgrown. What increases the risk? You may be at greater risk of allergies if other people in your family have allergies. What are the signs or symptoms? Symptoms depend on what type of allergy you have. They may include:  Runny, stuffy nose.  Sneezing.  Itchy mouth, ears, or throat.  Postnasal drip.  Sore throat.  Itchy, red, watery, or puffy eyes.  Skin rash or hives.  Stomach pain.  Vomiting.  Diarrhea.  Bloating.  Wheezing or coughing. People with a severe allergy to food, medicine, or an insect bite may have a life-threatening allergic reaction (anaphylaxis). Symptoms of anaphylaxis include:  Hives.  Itching.  Flushed face.  Swollen lips, tongue, or mouth.  Tight or swollen throat.  Chest pain or tightness in the chest.  Trouble breathing or shortness of breath.  Rapid heartbeat.  Dizziness or fainting.  Vomiting.  Diarrhea.  Pain in the abdomen. How is this diagnosed? This condition is diagnosed based on:  Your symptoms.  Your family and medical history.  A physical exam. You may need to see a health care provider who specializes in treating allergies (allergist). You may also have tests, including:  Skin tests  to see which allergens are causing your symptoms, such as: ? Skin prick test. In this test, your skin is pricked with a tiny needle and exposed to small amounts of possible allergens to see if your skin reacts. ? Intradermal skin test. In this test, a small amount of allergen is injected under your skin to see if your skin reacts. ? Patch test. In this test, a small amount of allergen is placed on your skin and then your skin is covered with a bandage. Your health care provider will check your skin after a couple of days to see if a rash has developed.  Blood tests.  Challenges tests. In this test, you inhale a small amount of allergen by mouth to see if you have an allergic reaction. You may also be asked to:  Keep a food diary. A food diary is a record of all the foods and drinks you have in a day and any symptoms you experience.  Practice an elimination diet. An elimination diet involves eliminating specific foods from your diet and then adding them back in one by one to find out if a certain food causes an allergic reaction. How is this treated? Treatment for allergies depends on your symptoms. Treatment may include:  Cold compresses to soothe itching and swelling.  Eye drops.  Nasal sprays.  Using a saline spray or container (neti pot) to flush out the nose (nasal irrigation). These methods can help clear away mucus and keep the nasal passages moist.  Using a humidifier.  Oral antihistamines or other medicines to block allergic reaction and inflammation.  Skin creams to treat rashes or itching.  Diet changes to eliminate food allergy triggers.  Repeated exposure to tiny amounts of allergens to build up a tolerance and prevent future allergic reactions (immunotherapy). These include: ? Allergy shots. ? Oral treatment. This involves taking small doses of an allergen under the tongue (sublingual immunotherapy).  Emergency epinephrine injection (auto-injector) in case of an  allergic emergency. This is a self-injectable, pre-measured medicine that must be given within the first few minutes of a serious allergic reaction. Follow these instructions at home:         Avoid known allergens whenever possible.  If you suffer from airborne allergens, wash out your nose daily. You can do this with a saline spray or a neti pot to flush out your nose (nasal irrigation).  Take over-the-counter and prescription medicines only as told by your health care provider.  Keep all follow-up visits as told by your health care provider. This is important.  If you are at risk of a severe allergic reaction (anaphylaxis), keep your auto-injector with you at all times.  If you have ever had anaphylaxis, wear a medical alert bracelet or necklace that states you have a severe allergy. Contact a health care provider if:  Your symptoms do not improve with treatment. Get help right away if:  You have symptoms of anaphylaxis, such as: ? Swollen mouth, tongue, or throat. ? Pain or tightness in your chest. ? Trouble breathing or shortness of breath. ? Dizziness or fainting. ? Severe abdominal pain, vomiting, or diarrhea. This information is not intended to replace advice given to you by your health care provider. Make sure you discuss any questions you have with your health care provider. Document Revised: 05/12/2017 Document Reviewed: 09/04/2015 Elsevier Patient Education  Opal oral dissolving tablet What is this medicine? RIMEGEPANT (ri ME je pant) is used to treat migraine headaches with or without aura. An aura is a strange feeling or visual disturbance that warns you of an attack. It is not used to prevent migraines. This medicine may be used for other purposes; ask your health care provider or pharmacist if you have questions. COMMON BRAND NAME(S): NURTEC ODT What should I tell my health care provider before I take this medicine? They need to know  if you have any of these conditions:  kidney disease  liver disease  an unusual or allergic reaction to rimegepant, other medicines, foods, dyes, or preservatives  pregnant or trying to get pregnant  breast-feeding How should I use this medicine? Take the medicine by mouth. Follow the directions on the prescription label. Leave the tablet in the sealed blister pack until you are ready to take it. With dry hands, open the blister and gently remove the tablet. If the tablet breaks or crumbles, throw it away and take a new tablet out of the blister pack. Place the tablet in the mouth and allow it to dissolve, and then swallow. Do not cut, crush, or chew this medicine. You do not need water to take this medicine. Talk to your pediatrician about the use of this medicine in children. Special care may be needed. Overdosage: If you think you have taken too much of this medicine contact a poison control center or emergency room at once. NOTE: This medicine is only for you. Do not share this medicine with others. What if I miss a dose? This does not apply. This medicine is not for regular use. What may interact with this medicine? This medicine may interact with the following medications:  certain medicines for fungal infections like fluconazole, itraconazole  rifampin This list may not describe all possible interactions. Give your health care provider a list of all the medicines, herbs, non-prescription drugs, or dietary supplements you use. Also tell them if you smoke, drink alcohol, or use illegal drugs. Some items may interact with your medicine. What should I watch for while using this medicine? Visit your health care professional for regular checks on your progress. Tell your health care professional if your symptoms do not start to get better or if they get worse. What side effects may I notice from receiving this medicine? Side effects that you should report to your doctor or health care  professional as soon as possible:  allergic reactions like skin rash, itching or hives; swelling of the face, lips, or tongue Side effects that usually do not require medical attention (report these to your doctor or health care professional if they continue or are bothersome):  nausea This list may not describe all possible side effects. Call your doctor for medical advice about side effects. You may report side effects to FDA at 1-800-FDA-1088. Where should I keep my medicine? Keep out of the reach of children. Store at room temperature between 15 and 30 degrees C (59 and 86 degrees F). Throw away any unused medicine after the expiration date. NOTE: This sheet is a summary. It may not cover all possible information. If you have questions about this medicine, talk to your doctor, pharmacist, or health care provider.  2020 Elsevier/Gold Standard (2018-05-02 00:21:31)

## 2019-05-30 ENCOUNTER — Encounter: Payer: Self-pay | Admitting: Neurology

## 2019-05-30 DIAGNOSIS — G44229 Chronic tension-type headache, not intractable: Secondary | ICD-10-CM | POA: Insufficient documentation

## 2019-06-01 DIAGNOSIS — R590 Localized enlarged lymph nodes: Secondary | ICD-10-CM | POA: Diagnosis not present

## 2019-06-01 DIAGNOSIS — K219 Gastro-esophageal reflux disease without esophagitis: Secondary | ICD-10-CM | POA: Diagnosis not present

## 2019-06-01 DIAGNOSIS — R05 Cough: Secondary | ICD-10-CM | POA: Diagnosis not present

## 2019-06-02 ENCOUNTER — Other Ambulatory Visit: Payer: Self-pay | Admitting: Otolaryngology

## 2019-06-02 ENCOUNTER — Other Ambulatory Visit (HOSPITAL_COMMUNITY): Payer: Self-pay | Admitting: Otolaryngology

## 2019-06-02 DIAGNOSIS — R221 Localized swelling, mass and lump, neck: Secondary | ICD-10-CM

## 2019-06-05 DIAGNOSIS — N390 Urinary tract infection, site not specified: Secondary | ICD-10-CM | POA: Diagnosis not present

## 2019-06-16 ENCOUNTER — Ambulatory Visit (HOSPITAL_COMMUNITY)
Admission: RE | Admit: 2019-06-16 | Discharge: 2019-06-16 | Disposition: A | Discharge status: Home / Self Care | Payer: PPO | Source: Ambulatory Visit | Attending: Otolaryngology | Admitting: Otolaryngology

## 2019-06-16 ENCOUNTER — Other Ambulatory Visit: Payer: Self-pay

## 2019-06-16 DIAGNOSIS — R221 Localized swelling, mass and lump, neck: Secondary | ICD-10-CM | POA: Insufficient documentation

## 2019-06-16 LAB — POCT I-STAT CREATININE: Creatinine, Ser: 1 mg/dL (ref 0.44–1.00)

## 2019-06-16 MED ORDER — IOHEXOL 300 MG/ML  SOLN
75.0000 mL | Freq: Once | INTRAMUSCULAR | Status: AC | PRN
  Administered 2019-06-16: 17:00:00 75 mL via INTRAVENOUS

## 2019-06-22 ENCOUNTER — Other Ambulatory Visit (INDEPENDENT_AMBULATORY_CARE_PROVIDER_SITE_OTHER): Payer: Self-pay | Admitting: Otolaryngology

## 2019-06-22 DIAGNOSIS — D351 Benign neoplasm of parathyroid gland: Secondary | ICD-10-CM | POA: Diagnosis not present

## 2019-06-22 DIAGNOSIS — D487 Neoplasm of uncertain behavior of other specified sites: Secondary | ICD-10-CM | POA: Diagnosis not present

## 2019-06-22 DIAGNOSIS — D442 Neoplasm of uncertain behavior of parathyroid gland: Secondary | ICD-10-CM | POA: Diagnosis not present

## 2019-06-23 ENCOUNTER — Other Ambulatory Visit (HOSPITAL_COMMUNITY): Payer: Self-pay | Admitting: Otolaryngology

## 2019-06-23 ENCOUNTER — Encounter (HOSPITAL_COMMUNITY): Payer: Self-pay

## 2019-06-23 DIAGNOSIS — R221 Localized swelling, mass and lump, neck: Secondary | ICD-10-CM

## 2019-06-23 LAB — PARATHYROID HORMONE, INTACT (NO CA): PTH: 43 pg/mL (ref 15–65)

## 2019-06-23 LAB — CALCIUM: Calcium: 9.5 mg/dL (ref 8.7–10.3)

## 2019-06-23 NOTE — Progress Notes (Signed)
Kristi Weiss Female, 74 y.o., [birth year] MRN:  [id number] Phone:  [phone] (H) PCP:  [employer], MD Coverage:  [employer]  RE: Biopsy Received: Today Message Contents  Kristi Cleveland, MD  Kristi Weiss      Ok   [place] core L neck LAN  Surg path and culture   [facility]   Previous Messages  ----- Message -----  From: Kristi Weiss  Sent: 06/23/2019 12:09 PM EDT  To: [facility] Procedure Requests  Subject: Biopsy                      Procedure Requested: US GUIDED FNA NECK MASS    Reason for Procedure: LEFT NECK MASS    Provider Requesting: DR Raylene Miyamoto  Provider Telephone: [phone]    Other Info: RAD EXAMS IN Epic

## 2019-06-28 ENCOUNTER — Other Ambulatory Visit: Payer: Self-pay | Admitting: Radiology

## 2019-06-29 ENCOUNTER — Ambulatory Visit (HOSPITAL_COMMUNITY)
Admission: RE | Admit: 2019-06-29 | Discharge: 2019-06-29 | Disposition: A | Discharge status: Home / Self Care | Payer: PPO | Source: Ambulatory Visit | Attending: Otolaryngology | Admitting: Otolaryngology

## 2019-06-29 ENCOUNTER — Other Ambulatory Visit: Payer: Self-pay

## 2019-06-29 ENCOUNTER — Encounter (HOSPITAL_COMMUNITY): Payer: Self-pay

## 2019-06-29 DIAGNOSIS — E039 Hypothyroidism, unspecified: Secondary | ICD-10-CM | POA: Insufficient documentation

## 2019-06-29 DIAGNOSIS — Z85828 Personal history of other malignant neoplasm of skin: Secondary | ICD-10-CM | POA: Insufficient documentation

## 2019-06-29 DIAGNOSIS — Z801 Family history of malignant neoplasm of trachea, bronchus and lung: Secondary | ICD-10-CM | POA: Diagnosis not present

## 2019-06-29 DIAGNOSIS — Z87891 Personal history of nicotine dependence: Secondary | ICD-10-CM | POA: Diagnosis not present

## 2019-06-29 DIAGNOSIS — K219 Gastro-esophageal reflux disease without esophagitis: Secondary | ICD-10-CM | POA: Insufficient documentation

## 2019-06-29 DIAGNOSIS — Z79899 Other long term (current) drug therapy: Secondary | ICD-10-CM | POA: Insufficient documentation

## 2019-06-29 DIAGNOSIS — R590 Localized enlarged lymph nodes: Secondary | ICD-10-CM | POA: Insufficient documentation

## 2019-06-29 DIAGNOSIS — C801 Malignant (primary) neoplasm, unspecified: Secondary | ICD-10-CM | POA: Diagnosis not present

## 2019-06-29 DIAGNOSIS — C770 Secondary and unspecified malignant neoplasm of lymph nodes of head, face and neck: Secondary | ICD-10-CM | POA: Diagnosis not present

## 2019-06-29 DIAGNOSIS — R221 Localized swelling, mass and lump, neck: Secondary | ICD-10-CM

## 2019-06-29 LAB — CBC WITH DIFFERENTIAL/PLATELET
Abs Immature Granulocytes: 0.02 10*3/uL (ref 0.00–0.07)
Basophils Absolute: 0.1 10*3/uL (ref 0.0–0.1)
Basophils Relative: 1 %
Eosinophils Absolute: 0.1 10*3/uL (ref 0.0–0.5)
Eosinophils Relative: 1 %
HCT: 45.5 % (ref 36.0–46.0)
Hemoglobin: 14.4 g/dL (ref 12.0–15.0)
Immature Granulocytes: 0 %
Lymphocytes Relative: 26 %
Lymphs Abs: 2 10*3/uL (ref 0.7–4.0)
MCH: 29.6 pg (ref 26.0–34.0)
MCHC: 31.6 g/dL (ref 30.0–36.0)
MCV: 93.4 fL (ref 80.0–100.0)
Monocytes Absolute: 0.5 10*3/uL (ref 0.1–1.0)
Monocytes Relative: 7 %
Neutro Abs: 4.9 10*3/uL (ref 1.7–7.7)
Neutrophils Relative %: 65 %
Platelets: 294 10*3/uL (ref 150–400)
RBC: 4.87 MIL/uL (ref 3.87–5.11)
RDW: 13.3 % (ref 11.5–15.5)
WBC: 7.5 10*3/uL (ref 4.0–10.5)
nRBC: 0 % (ref 0.0–0.2)

## 2019-06-29 LAB — PROTIME-INR
INR: 0.9 (ref 0.8–1.2)
Prothrombin Time: 12.1 seconds (ref 11.4–15.2)

## 2019-06-29 MED ORDER — FENTANYL CITRATE (PF) 100 MCG/2ML IJ SOLN
INTRAMUSCULAR | Status: DC | PRN
  Administered 2019-06-29: 25 ug via INTRAVENOUS

## 2019-06-29 MED ORDER — MIDAZOLAM HCL 2 MG/2ML IJ SOLN
INTRAMUSCULAR | Status: AC
  Filled 2019-06-29: qty 2

## 2019-06-29 MED ORDER — SODIUM CHLORIDE 0.9 % IV SOLN: INTRAVENOUS | Status: DC

## 2019-06-29 MED ORDER — LIDOCAINE HCL (PF) 1 % IJ SOLN
INTRAMUSCULAR | Status: AC
  Filled 2019-06-29: qty 30

## 2019-06-29 MED ORDER — MIDAZOLAM HCL 2 MG/2ML IJ SOLN
INTRAMUSCULAR | Status: DC | PRN
  Administered 2019-06-29: 1 mg via INTRAVENOUS

## 2019-06-29 MED ORDER — FENTANYL CITRATE (PF) 100 MCG/2ML IJ SOLN
INTRAMUSCULAR | Status: AC
  Filled 2019-06-29: qty 2

## 2019-06-29 NOTE — Procedures (Signed)
Interventional Radiology Procedure Note  Procedure: Successful US guided core biopsy of left cervical LN.   Complications: None.   Estimated Blood Loss: None.   Recommendations: - Path sent - DC Home   Signed,  Criselda Peaches, MD

## 2019-06-29 NOTE — Discharge Instructions (Addendum)
Needle Biopsy, Care After These instructions tell you how to care for yourself after your procedure. Your doctor may also give you more specific instructions. Call your doctor if you have any problems or questions. What can I expect after the procedure? After the procedure, it is common to have:  Soreness.  Bruising.  Mild pain. Follow these instructions at home:   Return to your normal activities as told by your doctor. Ask your doctor what activities are safe for you.  Take over-the-counter and prescription medicines only as told by your doctor.  Wash your hands with soap and water before you change your bandage (dressing). If you cannot use soap and water, use hand sanitizer.  Follow instructions from your doctor about: ? How to take care of your puncture site. ? When and how to change your bandage. ? When to remove your bandage.  Check your puncture site every day for signs of infection. Watch for: ? Redness, swelling, or pain. ? Fluid or blood. ? Pus or a bad smell. ? Warmth.  Do not take baths, swim, or use a hot tub until your doctor approves. Ask your doctor if you may take showers. You may only be allowed to take sponge baths.  Keep all follow-up visits as told by your doctor. This is important. Contact a doctor if you have:  A fever.  Redness, swelling, or pain at the puncture site, and it lasts longer than a few days.  Fluid, blood, or pus coming from the puncture site.  Warmth coming from the puncture site. Get help right away if:  You have a lot of bleeding from the puncture site. Summary  After the procedure, it is common to have soreness, bruising, or mild pain at the puncture site.  Check your puncture site every day for signs of infection, such as redness, swelling, or pain.  Get help right away if you have severe bleeding from your puncture site. This information is not intended to replace advice given to you by your health care provider. Make  sure you discuss any questions you have with your health care provider. Document Revised: 03/01/2017 Document Reviewed: 03/01/2017 Elsevier Patient Education  2020 Elsevier Inc. Moderate Conscious Sedation, Adult Sedation is the use of medicines to promote relaxation and relieve discomfort and anxiety. Moderate conscious sedation is a type of sedation. Under moderate conscious sedation, you are less alert than normal, but you are still able to respond to instructions, touch, or both. Moderate conscious sedation is used during short medical and dental procedures. It is milder than deep sedation, which is a type of sedation under which you cannot be easily woken up. It is also milder than general anesthesia, which is the use of medicines to make you unconscious. Moderate conscious sedation allows you to return to your regular activities sooner. Tell a health care provider about:  Any allergies you have.  All medicines you are taking, including vitamins, herbs, eye drops, creams, and over-the-counter medicines.  Use of steroids (by mouth or creams).  Any problems you or family members have had with sedatives and anesthetic medicines.  Any blood disorders you have.  Any surgeries you have had.  Any medical conditions you have, such as sleep apnea.  Whether you are pregnant or may be pregnant.  Any use of cigarettes, alcohol, marijuana, or street drugs. What are the risks? Generally, this is a safe procedure. However, problems may occur, including:  Getting too much medicine (oversedation).  Nausea.  Allergic reaction to   medicines.  Trouble breathing. If this happens, a breathing tube may be used to help with breathing. It will be removed when you are awake and breathing on your own.  Heart trouble.  Lung trouble. What happens before the procedure? Staying hydrated Follow instructions from your health care provider about hydration, which may include:  Up to 2 hours before the  procedure - you may continue to drink clear liquids, such as water, clear fruit juice, black coffee, and plain tea. Eating and drinking restrictions Follow instructions from your health care provider about eating and drinking, which may include:  8 hours before the procedure - stop eating heavy meals or foods such as meat, fried foods, or fatty foods.  6 hours before the procedure - stop eating light meals or foods, such as toast or cereal.  6 hours before the procedure - stop drinking milk or drinks that contain milk.  2 hours before the procedure - stop drinking clear liquids. Medicine Ask your health care provider about:  Changing or stopping your regular medicines. This is especially important if you are taking diabetes medicines or blood thinners.  Taking medicines such as aspirin and ibuprofen. These medicines can thin your blood. Do not take these medicines before your procedure if your health care provider instructs you not to.  Tests and exams  You will have a physical exam.  You may have blood tests done to show: ? How well your kidneys and liver are working. ? How well your blood can clot. General instructions  Plan to have someone take you home from the hospital or clinic.  If you will be going home right after the procedure, plan to have someone with you for 24 hours. What happens during the procedure?  An IV tube will be inserted into one of your veins.  Medicine to help you relax (sedative) will be given through the IV tube.  The medical or dental procedure will be performed. What happens after the procedure?  Your blood pressure, heart rate, breathing rate, and blood oxygen level will be monitored often until the medicines you were given have worn off.  Do not drive for 24 hours. This information is not intended to replace advice given to you by your health care provider. Make sure you discuss any questions you have with your health care provider. Document  Revised: 01/29/2017 Document Reviewed: 06/08/2015 Elsevier Patient Education  2020 [employer] Inc.  

## 2019-06-29 NOTE — H&P (Signed)
Chief Complaint: Patient was seen in consultation today for left neck lymph node biopsy at the request of Teoh,Su  Referring Physician(s): Teoh,Su  Supervising Physician: Jacqulynn Cadet  Patient Status: [facility] - Out-pt  History of Present Illness: Kristi Weiss is a 74 y.o. female   Pt in normal state of health with sudden development of sinus drainage and constant clearing throat approx 1 mo ago. Also felt like she had swollen Left neck Lymph node Was seen by PMD and referred to Dr Benjamine Mola Non smoker Denies pain  3/25 [place]: FINDINGS: Hypoechoic mass with complex echotexture within the upper left neck measures 1.6 x 1.2 x 1.2 cm. No internal blood flow is demonstrated.  CT 06/16/19: IMPRESSION: 1. Round, enlarged lymph node at left level 2A, concerning for metastasis from an unknown primary. Histologic sampling should be considered. 2. Exophytic focus arising from the posterior aspect of the left thyroid lobe, directly posterior to the thyroid isthmus and is at the level of the tracheoesophageal groove. This could indicate a parathyroid adenoma. Consider correlation with relevant laboratory values. Due to its deep location, this area may be poorly visualized by ultrasound.  Scheduled now for biopsy of enlarged left neck lymph node   Past Medical History:  Diagnosis Date  . Arthritis   . GERD (gastroesophageal reflux disease)   . Hypothyroidism   . 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 Scoliosis   . Sinusitis   . 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    Past Surgical History:  Procedure Laterality Date  . NO PAST SURGERIES    . No prior surgery      Allergies: Sulfamethoxazole-trimethoprim and Penicillins  Medications: Prior to  Admission medications   Medication Sig Start Date End Date Taking? Authorizing Provider  esomeprazole (NEXIUM) 40 MG capsule Take 40 mg by mouth daily.    Yes [provider]  fluticasone (FLONASE) 50 MCG/ACT nasal spray Place 1 spray into both nostrils daily as needed for allergies or rhinitis.   Yes [provider]  ibuprofen (ADVIL) 200 MG tablet Take 400-600 mg by mouth every 8 (eight) hours as needed for moderate pain.    Yes [provider]  levothyroxine (SYNTHROID) 50 MCG tablet Take 50 mcg by mouth daily. BRAND   Yes [provider]  Rimegepant Sulfate (NURTEC) 75 MG TBDP Take 75 mg by mouth daily as needed. For migraines. Take as close to onset of migraine as possible. One daily maximum. 05/29/19  Yes Melvenia Beam, MD     Family History  Problem Relation Age of Onset  . Lung cancer Mother        smoker  . Lung cancer Father        smoker  . Diabetes Father   . CVA Brother   . Diabetes Brother   . Diabetes Maternal Grandmother   . Heart attack Paternal Grandfather   . Migraines Neg Hx   . Headache Neg Hx     Social History   Socioeconomic History  . Marital status: Widowed    Spouse name: Not on file  . Number of children: 1  . Years of education: Not on file  . Highest education level: Not on file  Occupational History  .  Not on file  Tobacco Use  . Smoking status: Former Research scientist (life sciences)  . Smokeless tobacco: Never Used  . Tobacco comment: some in her 7s and 30s  Substance and Sexual Activity  . Alcohol use: Yes    Comment: on rare occasion "like twice a year"  . Drug use: Never  . Sexual activity: Not on file  Other Topics Concern  . Not on file  Social History Narrative   Lives at home alone   Retired   Widow   Caffeine: about 4 cups coffee, 3 glasses of tea daily   Social Determinants of Health   Financial Resource Strain:   . Difficulty of Paying Living Expenses:   Food Insecurity:   . Worried About Sales executive in the Last Year:   . Arboriculturist in the Last Year:   Transportation Needs:   . Film/video editor (Medical):   Marland Kitchen Lack of Transportation (Non-Medical):   Physical Activity:   . Days of Exercise per Week:   . Minutes of Exercise per Session:   Stress:   . Feeling of Stress :   Social Connections:   . Frequency of Communication with Friends and Family:   . Frequency of Social Gatherings with Friends and Family:   . Attends Religious Services:   . Active Member of Clubs or Organizations:   . Attends Archivist Meetings:   Marland Kitchen Marital Status:     Review of Systems: A 12 point ROS discussed and pertinent positives are indicated in the HPI above.  All other systems are negative.  Review of Systems  Constitutional: Negative for activity change, fatigue and fever.  HENT: Positive for postnasal drip. Negative for sore throat and trouble swallowing.   Respiratory: Negative for cough and choking.   Cardiovascular: Negative for chest pain.  Gastrointestinal: Negative for abdominal pain.  Psychiatric/Behavioral: Negative for behavioral problems and confusion.    Vital Signs: BP (!) 152/75   Pulse 61   Temp 98.7 F (37.1 C) (Skin)   Resp 16   Ht 5\' 8"  (1.727 m)   Wt 217 lb (98.4 kg)   SpO2 95%   BMI 32.99 kg/m   Physical Exam Vitals reviewed.  Cardiovascular:     Rate and Rhythm: Normal rate and regular rhythm.     Heart sounds: Normal heart sounds.  Pulmonary:     Effort: Pulmonary effort is normal.     Breath sounds: Normal breath sounds.  Abdominal:     Palpations: Abdomen is soft.     Tenderness: There is no abdominal tenderness.  Musculoskeletal:        General: Normal range of motion.  Skin:    General: Skin is warm and dry.  Neurological:     Mental Status: She is alert and oriented to person, place, and time.  Psychiatric:        Behavior: Behavior normal.        Thought Content: Thought content normal.        Judgment: Judgment normal.      Imaging: CT SOFT TISSUE NECK W CONTRAST  Result Date: 06/17/2019 CLINICAL DATA:  Left submandibular lump. EXAM: CT NECK WITH CONTRAST TECHNIQUE: Multidetector CT imaging of the neck was performed using the standard protocol following the bolus administration of intravenous contrast. CONTRAST:  65mL OMNIPAQUE IOHEXOL 300 MG/ML  SOLN COMPARISON:  Soft tissue ultrasound 05/25/2019 FINDINGS: PHARYNX AND LARYNX: The nasopharynx, oropharynx and larynx are normal. Visible portions of the oral  cavity, tongue base and floor of mouth are normal. Normal epiglottis, vallecula and pyriform sinuses. The larynx is normal. No retropharyngeal abscess, effusion or lymphadenopathy. SALIVARY GLANDS: Left submandibular gland is enlarged. The parotid glands are normal. THYROID: There is an exophytic focus arising from the posterior aspect of the left thyroid lobe, measuring approximately 1.3 cm. This extends inferior to the thyroid isthmus and is at the level of the tracheoesophageal groove. LYMPH NODES: At left level 2A, directly posterior to the submandibular gland and deep to the parotid gland, there is an enlarged lymph node measuring 1.6 cm. No other enlarged or abnormal density nodes. VASCULAR: Calcific aortic atherosclerosis. Mild calcification of the carotid bifurcations. Major vessels are patent. LIMITED INTRACRANIAL: Normal. VISUALIZED ORBITS: Normal. MASTOIDS AND VISUALIZED PARANASAL SINUSES: No fluid levels or advanced mucosal thickening. No mastoid effusion. SKELETON: No bony spinal canal stenosis. No lytic or blastic lesions. UPPER CHEST: Clear. OTHER: None. IMPRESSION: 1. Round, enlarged lymph node at left level 2A, concerning for metastasis from an unknown primary. Histologic sampling should be considered. 2. Exophytic focus arising from the posterior aspect of the left thyroid lobe, directly posterior to the thyroid isthmus and is at the level of the tracheoesophageal groove. This could indicate a parathyroid  adenoma. Consider correlation with relevant laboratory values. Due to its deep location, this area may be poorly visualized by ultrasound. 3. Clear visualized paranasal sinuses. 4. Aortic Atherosclerosis (ICD10-I70.0). Electronically Signed   By: Ulyses Jarred M.D.   On: 06/17/2019 23:46    Labs:  CBC: No results for input(s): WBC, HGB, HCT, PLT in the last 8760 hours.  COAGS: No results for input(s): INR, APTT in the last 8760 hours.  BMP: Recent Labs    04/05/19 1429 06/16/19 1721 06/22/19 1556  NA 142  --   --   K 5.2  --   --   CL 101  --   --   CO2 27  --   --   GLUCOSE 95  --   --   BUN 14  --   --   CALCIUM 9.8  --  9.5  CREATININE 0.89 1.00  --   GFRNONAA 65  --   --   GFRAA 75  --   --     LIVER FUNCTION TESTS: No results for input(s): BILITOT, AST, ALT, ALKPHOS, PROT, ALBUMIN in the last 8760 hours.  TUMOR MARKERS: No results for input(s): AFPTM, CEA, CA199, CHROMGRNA in the last 8760 hours.  Assessment and Plan:  Enlarged left neck lymph node Imaging report recommends sampling Scheduled now for same Risks and benefits of left neck lymph node biopsy was discussed with the patient and/or patient's family including, but not limited to bleeding, infection, damage to adjacent structures or low yield requiring additional tests.  All of the questions were answered and there is agreement to proceed. Consent signed and in chart.  Thank you for this interesting consult.  I greatly enjoyed meeting Kristi Weiss and look forward to participating in their care.  A copy of this report was sent to the requesting provider on this date.  Electronically Signed: Lavonia Drafts, PA-C 06/29/2019, 11:39 AM   I spent a total of  30 Minutes   in face to face in clinical consultation, greater than 50% of which was counseling/coordinating care for left neck lymph node bx

## 2019-07-03 LAB — SURGICAL PATHOLOGY

## 2019-07-07 ENCOUNTER — Other Ambulatory Visit (HOSPITAL_COMMUNITY): Payer: Self-pay | Admitting: Otolaryngology

## 2019-07-07 DIAGNOSIS — C760 Malignant neoplasm of head, face and neck: Secondary | ICD-10-CM

## 2019-07-18 ENCOUNTER — Ambulatory Visit (HOSPITAL_COMMUNITY)
Admission: RE | Admit: 2019-07-18 | Discharge: 2019-07-18 | Disposition: A | Discharge status: Home / Self Care | Payer: PPO | Source: Ambulatory Visit | Attending: Otolaryngology | Admitting: Otolaryngology

## 2019-07-18 ENCOUNTER — Other Ambulatory Visit: Payer: Self-pay

## 2019-07-18 DIAGNOSIS — C760 Malignant neoplasm of head, face and neck: Secondary | ICD-10-CM | POA: Diagnosis not present

## 2019-07-18 LAB — GLUCOSE, CAPILLARY: Glucose-Capillary: 103 mg/dL — ABNORMAL HIGH (ref 70–99)

## 2019-07-18 MED ORDER — FLUDEOXYGLUCOSE F - 18 (FDG) INJECTION
11.2400 | Freq: Once | INTRAVENOUS | Status: AC | PRN
  Administered 2019-07-18: 11.24 via INTRAVENOUS

## 2019-07-24 ENCOUNTER — Other Ambulatory Visit (HOSPITAL_COMMUNITY): Payer: PPO

## 2019-07-25 ENCOUNTER — Other Ambulatory Visit: Payer: Self-pay | Admitting: Otolaryngology

## 2019-07-25 DIAGNOSIS — C142 Malignant neoplasm of Waldeyer's ring: Secondary | ICD-10-CM | POA: Diagnosis not present

## 2019-07-26 ENCOUNTER — Other Ambulatory Visit: Payer: Self-pay

## 2019-07-26 ENCOUNTER — Inpatient Hospital Stay (HOSPITAL_COMMUNITY): Payer: PPO

## 2019-07-26 ENCOUNTER — Encounter (HOSPITAL_COMMUNITY): Payer: Self-pay | Admitting: Hematology

## 2019-07-26 ENCOUNTER — Inpatient Hospital Stay (HOSPITAL_COMMUNITY): Payer: PPO | Attending: Hematology | Admitting: Hematology

## 2019-07-26 VITALS — BP 142/83 | HR 87 | Temp 97.3°F | Resp 19 | Ht 64.0 in | Wt 214.4 lb

## 2019-07-26 DIAGNOSIS — Z8249 Family history of ischemic heart disease and other diseases of the circulatory system: Secondary | ICD-10-CM | POA: Diagnosis not present

## 2019-07-26 DIAGNOSIS — K219 Gastro-esophageal reflux disease without esophagitis: Secondary | ICD-10-CM

## 2019-07-26 DIAGNOSIS — Z801 Family history of malignant neoplasm of trachea, bronchus and lung: Secondary | ICD-10-CM | POA: Insufficient documentation

## 2019-07-26 DIAGNOSIS — Z87891 Personal history of nicotine dependence: Secondary | ICD-10-CM | POA: Diagnosis not present

## 2019-07-26 DIAGNOSIS — Z79899 Other long term (current) drug therapy: Secondary | ICD-10-CM | POA: Diagnosis not present

## 2019-07-26 DIAGNOSIS — C099 Malignant neoplasm of tonsil, unspecified: Secondary | ICD-10-CM

## 2019-07-26 DIAGNOSIS — Z833 Family history of diabetes mellitus: Secondary | ICD-10-CM | POA: Diagnosis not present

## 2019-07-26 DIAGNOSIS — C760 Malignant neoplasm of head, face and neck: Secondary | ICD-10-CM

## 2019-07-26 DIAGNOSIS — E039 Hypothyroidism, unspecified: Secondary | ICD-10-CM | POA: Diagnosis not present

## 2019-07-26 DIAGNOSIS — E041 Nontoxic single thyroid nodule: Secondary | ICD-10-CM | POA: Diagnosis not present

## 2019-07-26 LAB — CBC WITH DIFFERENTIAL/PLATELET
Abs Immature Granulocytes: 0.04 10*3/uL (ref 0.00–0.07)
Basophils Absolute: 0 10*3/uL (ref 0.0–0.1)
Basophils Relative: 0 %
Eosinophils Absolute: 0.1 10*3/uL (ref 0.0–0.5)
Eosinophils Relative: 1 %
HCT: 43.6 % (ref 36.0–46.0)
Hemoglobin: 13.9 g/dL (ref 12.0–15.0)
Immature Granulocytes: 0 %
Lymphocytes Relative: 24 %
Lymphs Abs: 2.2 10*3/uL (ref 0.7–4.0)
MCH: 29.4 pg (ref 26.0–34.0)
MCHC: 31.9 g/dL (ref 30.0–36.0)
MCV: 92.4 fL (ref 80.0–100.0)
Monocytes Absolute: 0.6 10*3/uL (ref 0.1–1.0)
Monocytes Relative: 6 %
Neutro Abs: 6 10*3/uL (ref 1.7–7.7)
Neutrophils Relative %: 69 %
Platelets: 310 10*3/uL (ref 150–400)
RBC: 4.72 MIL/uL (ref 3.87–5.11)
RDW: 13.4 % (ref 11.5–15.5)
WBC: 9 10*3/uL (ref 4.0–10.5)
nRBC: 0 % (ref 0.0–0.2)

## 2019-07-26 LAB — COMPREHENSIVE METABOLIC PANEL
ALT: 21 U/L (ref 0–44)
AST: 20 U/L (ref 15–41)
Albumin: 4.7 g/dL (ref 3.5–5.0)
Alkaline Phosphatase: 91 U/L (ref 38–126)
Anion gap: 11 (ref 5–15)
BUN: 14 mg/dL (ref 8–23)
CO2: 26 mmol/L (ref 22–32)
Calcium: 9 mg/dL (ref 8.9–10.3)
Chloride: 98 mmol/L (ref 98–111)
Creatinine, Ser: 0.74 mg/dL (ref 0.44–1.00)
GFR calc Af Amer: >60 mL/min (ref >60)
GFR calc non Af Amer: >60 mL/min (ref >60)
Glucose, Bld: 105 mg/dL — ABNORMAL HIGH (ref 70–99)
Potassium: 3.8 mmol/L (ref 3.5–5.1)
Sodium: 135 mmol/L (ref 135–145)
Total Bilirubin: 0.5 mg/dL (ref 0.3–1.2)
Total Protein: 7.4 g/dL (ref 6.5–8.1)

## 2019-07-26 NOTE — Patient Instructions (Addendum)
[facility] Discharge Instructions  You were seen today by Dr. Delton Coombes. He went over your recent results. 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 He discussed with you the difference between surgical and chemotherapy/radiation options of treatment. He discussed the risks and benefits of chemotherapy and radiation. We will be referring you to [place]. Dr. Delton Coombes will see you back in for labs and follow up.   Thank you for choosing [facility] to provide your oncology and hematology care.  To afford each patient quality time with our provider, please arrive at least 15 minutes before your scheduled appointment time.   If you have a lab appointment with the [place] please come in thru the  Main Entrance and check in at the main information desk  You need to re-schedule your appointment should you arrive 10 or more minutes late.  We strive to give you quality time with our providers, and arriving late affects you and other patients whose appointments are after yours.  Also, if you no show three or more times for appointments you may be dismissed from the clinic at the providers discretion.     Again, thank you for choosing [facility].  Our hope is that these requests will decrease the amount of time that you wait before being seen by our physicians.       _____________________________________________________________  Should you have questions after your visit to [facility], please contact our office at [phone] between the hours of 8:00 a.m. and 4:30 p.m.  Voicemails left after 4:00 p.m. will not be returned until the following business day.  For prescription refill requests, have your pharmacy contact our office and allow 72 hours.    Cancer Center Support Programs:   > Cancer Support Group  2nd Tuesday of the month 1pm-2pm, Journey Room

## 2019-07-26 NOTE — Progress Notes (Signed)
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

## 2019-07-26 NOTE — Progress Notes (Signed)
[facility] [address], [place] [address]   CLINIC:  Medical Oncology/Hematology  CONSULT NOTE  Patient Care Team: Redmond School, MD as PCP - General (Internal Medicine) Satira Sark, MD as PCP - Cardiology (Cardiology) Gala Romney Cristopher Estimable, MD as Consulting Physician (Gastroenterology)  CHIEF COMPLAINTS/PURPOSE OF CONSULTATION:  Left tonsillar cancer.  HISTORY OF PRESENTING ILLNESS:  [name] 74 y.o. female is seen in consultation today at the request of Dr. Benjamine Weiss for further work-up and management of left tonsillar cancer.  She presented to Dr. Leta Weiss with difficulty swallowing and then she developed swelling of her left neck. Ultrasound revealed a possible necrotic lymph node, but CT revealed an enlarged lymph node concerning for metastasis from unknown primary. 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 PET scan revealed findings that were strongly suggestive of left sided head and neck cancer with left sided cervical nodal metastasis; .  She underwent left neck needle core biopsy on 06/29/2019 which was consistent with squamous cell carcinoma.  Denies any tingling or numbness extremities.  She is widowed.  She lives at home by herself and is independent of all ADLs and IADLs.  She quit smoking 40 years ago.  She smoked half pack per day for 10 years.  Worked for the school system.  Family history significant for mother with throat cancer and lung cancer.  Father had lung cancer.  Maternal aunt had cancer.   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     MEDICAL HISTORY:  Past Medical History:  Diagnosis Date  . Arthritis   . GERD (gastroesophageal reflux disease)   . Hypothyroidism   . Melanocytic nevus with features of Dysplastic  Nevus 06/30/2001   Upper Left Back  . 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 Scoliosis   . Sinusitis   . 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    SURGICAL HISTORY: Past Surgical History:  Procedure Laterality Date  . NO PAST SURGERIES    . No prior surgery      SOCIAL HISTORY: Social History   Socioeconomic History  . Marital status: Widowed    Spouse name: Not on file  . Number of children: 1  . Years of education: Not on file  . Highest education level: Not on file  Occupational History  . Not on file  Tobacco Use  . Smoking status: Former Research scientist (life sciences)  . Smokeless tobacco: Never Used  . Tobacco comment: some in her 77s and 30s  Substance and Sexual Activity  . Alcohol use: Yes    Comment: on rare occasion "like twice a year"  . Drug use: Never  . Sexual activity: Not on file  Other Topics Concern  . Not on file  Social History Narrative   Lives at home alone   Retired   Widow   Caffeine: about 4 cups coffee, 3 glasses of tea daily   Social Determinants of Health   Financial Resource Strain:   . Difficulty of Paying Living Expenses:   Food Insecurity:   . Worried About Charity fundraiser in the Last Year:   . Ran  Out of Food in the Last Year:   Transportation Needs:   . Lack of Transportation (Medical):   Marland Kitchen Lack of Transportation (Non-Medical):   Physical Activity:   . Days of Exercise per Week:   . Minutes of Exercise per Session:   Stress:   . Feeling of Stress :   Social Connections:   . Frequency of Communication with Friends and Family:   . Frequency of Social Gatherings with Friends and Family:   . Attends Religious Services:   . Active Member of Clubs or Organizations:   . Attends Archivist Meetings:   Marland Kitchen Marital Status:   Intimate Partner Violence:   . Fear of Current or Ex-Partner:   .  Emotionally Abused:   Marland Kitchen Physically Abused:   . Sexually Abused:     FAMILY HISTORY: Family History  Problem Relation Age of Onset  . Lung cancer Mother        smoker  . Lung cancer Father        smoker  . Diabetes Father   . CVA Brother   . Diabetes Brother   . Diabetes Maternal Grandmother   . Heart attack Paternal Grandfather   . Migraines Neg Hx   . Headache Neg Hx     ALLERGIES:  is allergic to sulfamethoxazole-trimethoprim and penicillins.  MEDICATIONS:  Current Outpatient Medications  Medication Sig Dispense Refill  . esomeprazole (NEXIUM) 40 MG capsule Take 40 mg by mouth daily.     . fluticasone (FLONASE) 50 MCG/ACT nasal spray Place 1 spray into both nostrils daily as needed for allergies or rhinitis.    Marland Kitchen ibuprofen (ADVIL) 200 MG tablet Take 400-600 mg by mouth every 8 (eight) hours as needed for moderate pain.     Marland Kitchen levothyroxine (SYNTHROID) 50 MCG tablet Take 50 mcg by mouth daily. BRAND    . Rimegepant Sulfate (NURTEC) 75 MG TBDP Take 75 mg by mouth daily as needed. For migraines. Take as close to onset of migraine as possible. One daily maximum. 4 tablet 0   No current facility-administered medications for this visit.    REVIEW OF SYSTEMS:   Review of Systems  Constitutional: Positive for fatigue (mild).  HENT:   Positive for sore throat (in mornings) and trouble swallowing.   Musculoskeletal: Positive for back pain (chronic).  Psychiatric/Behavioral: Positive for sleep disturbance (Due to stress). The patient is nervous/anxious.   All other systems reviewed and are negative.    PHYSICAL EXAMINATION: ECOG PERFORMANCE STATUS: 1 - Symptomatic but completely ambulatory  There were no vitals filed for this visit. There were no vitals filed for this visit. Physical Exam Vitals reviewed.      LABORATORY DATA:  I have reviewed the data as listed CBC Latest Ref Rng & Units 06/29/2019  WBC 4.0 - 10.5 K/uL 7.5  Hemoglobin 12.0 - 15.0 g/dL 14.4    Hematocrit 36.0 - 46.0 % 45.5  Platelets 150 - 400 K/uL 294   CMP Latest Ref Rng & Units 06/22/2019 06/16/2019 04/05/2019  Glucose 65 - 99 mg/dL - - 95  BUN 8 - 27 mg/dL - - 14  Creatinine 0.44 - 1.00 mg/dL - 1.00 0.89  Sodium 134 - 144 mmol/L - - 142  Potassium 3.5 - 5.2 mmol/L - - 5.2  Chloride 96 - 106 mmol/L - - 101  CO2 20 - 29 mmol/L - - 27  Calcium 8.7 - 10.3 mg/dL 9.5 - 9.8   06/29/2019 Biopsy A. LYMPH NODE, LEFT NECK, NEEDLE  CORE BIOPSY:  - Squamous cell carcinoma, p16 positive.  COMMENT:  The carcinoma is positive with p16, p40, cytokeratin 5/6 and p63 consistent with squamous cell carcinoma. The carcinoma is negative with Epstein-Barr virus (EBV), TTF-1 and Napsin-A.    RADIOGRAPHIC STUDIES: I have personally reviewed the radiological images as listed and agreed with the findings in the report. NM PET Image Initial (PI) Skull Base To Thigh  Result Date: 07/18/2019 CLINICAL DATA:  Initial treatment strategy for neck cancer. EXAM: NUCLEAR MEDICINE PET SKULL BASE TO THIGH TECHNIQUE: 11.24 mCi F-18 FDG was injected intravenously. Full-ring PET imaging was performed from the skull base to thigh after the radiotracer. CT data was obtained and used for attenuation correction and anatomic localization. Fasting blood glucose: 103 mg/dl COMPARISON:  Neck CT 06/17/2019 FINDINGS: Mediastinal blood pool activity: SUV max 2.83 Liver activity: SUV max NA NECK: LEFT level 2 a lymph node shown to have necrosis on the previous CT examination unchanged based on size, approximately 1 cm (image 29, series 4) (SUVmax = 10.2) Small rounded lymph node (image 23, series 4) 8 mm (SUVmax = 3.5) at LEFT level 2 B Intense palatine tonsillar uptake with asymmetric fullness of tonsillar tissue and asymmetric FDG activity (image 23 and 24, series 4) this is the image where masslike changes in this area may measure as large is 2.2 x 1.7 on the LEFT as compared to the RIGHT. (SUVmax = 16.2) this corresponds to  activity along the LEFT palatine tonsil and parapharyngeal region as compared to the RIGHT side which is approximately 7. Small focus of increased activity in the RIGHT neck adjacent to or within a small nodule in the RIGHT hemi thyroid (39, series 4) (SUVmax = 4.0) , Incidental CT findings: none CHEST: No hypermetabolic mediastinal or hilar nodes. No suspicious pulmonary nodules on the CT scan. Incidental CT findings: Calcified atheromatous plaque in the thoracic aorta which is nonaneurysmal. Calcified coronary artery disease. No pericardial effusion. No aortic dilation. ABDOMEN/PELVIS: No abnormal hypermetabolic activity within the liver, pancreas, adrenal glands, or spleen. No hypermetabolic lymph nodes in the abdomen or pelvis. Incidental CT findings: Liver, gallbladder and spleen are unremarkable. Adrenal glands are normal. Smooth renal contours. No hydronephrosis. No nephrolithiasis. No sign of acute gastrointestinal process. SKELETON: No focal hypermetabolic activity to suggest skeletal metastasis. Incidental CT findings: Rotary S-shaped scoliotic curvature with degenerative changes with dextroconvexity in the thoracic spine. IMPRESSION: 1. 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 Direct visualization may be helpful. 2. Mildly hypermetabolic area that may be associated with the RIGHT thyroid. Focused ultrasound of this area may be helpful to exclude a small lymph node though no nodal tissue is seen on the previous contrasted neck CT in there is a small nodular area in the thyroid. Biopsy could be considered if there is a focal area in the thyroid. 3. No distant metastatic disease. Electronically Signed   By: Zetta Bills M.D.   On: 07/18/2019 16:26   [place] CORE BIOPSY (LYMPH NODES)  Result Date: 06/29/2019 INDICATION: 74 year old female with pathologic left submandibular lymph node. EXAM: ULTRASOUND OF THE LYMPH NODES  MEDICATIONS: None. ANESTHESIA/SEDATION: Moderate (conscious) sedation was employed during this procedure. A total of Versed 1 mg and Fentanyl 25 mcg was administered intravenously. Moderate Sedation Time: 10 minutes. The patient's level of consciousness and vital signs were monitored continuously by radiology nursing throughout the procedure under my direct supervision. FLUOROSCOPY TIME:  None. COMPLICATIONS: None immediate.  PROCEDURE: Informed written consent was obtained from the patient after a thorough discussion of the procedural risks, benefits and alternatives. All questions were addressed. A timeout was performed prior to the initiation of the procedure. Ultrasound was used to interrogate the left submandibular space. An abnormal rounded hypoechoic 1.4 x 1.3 cm lymph node is identified interposition between the jugular vein and carotid artery. A suitable skin entry site was selected and marked. The overlying skin was sterilely prepped and draped in the standard fashion using chlorhexidine skin prep. Local anesthesia was attained by infiltration with 1% lidocaine. A small dermatotomy was made. Under real-time ultrasound guidance, multiple 18 gauge core biopsies were carefully obtained using the Bard mission automated biopsy device. Care was taken to make sure that the internal jugular vein and carotid artery were protected at all times. Biopsy specimens were placed in saline and delivered to pathology for further analysis. Post biopsy ultrasound imaging demonstrates no evidence of immediate complication. The patient tolerated the procedure well. IMPRESSION: Successful ultrasound-guided core biopsy of left submandibular space lymphadenopathy. Signed, Criselda Peaches, MD, [facility] Electronically Signed   By: Jacqulynn Cadet M.D.   On: 06/29/2019 18:14    ASSESSMENT:  1. Squamous cell carcinoma, p16 positive of the left tonsil: -  06/29/2019 Biopsy A. LYMPH NODE, LEFT NECK, NEEDLE CORE BIOPSY:  - Squamous cell carcinoma, p16 positive.  COMMENT:  The carcinoma is positive with p16, p40, cytokeratin 5/6 and p63 consistent with squamous cell carcinoma. The carcinoma is negative with Epstein-Barr virus (EBV), TTF-1 and Napsin-A.  - 07/18/2019 PET scan revealed findings that are strongly suggestive of LEFT sided head neck cancer with LEFT-sided cervical nodal metastasis as described.    PLAN:  1.  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  She had examination done by Dr. Benjamine Weiss which was reportedly within normal limits. -She subsequently developed left-sided neck swelling about 3 months ago.  Ultrasound of the neck revealed possible necrotic lymph node on the left side. -CT of the neck on 06/16/2019 showed round enlarged lymph node at level 2A on the left side concerning for metastatic cysts. -PET scan on 07/18/2019 shows left level 2A lymph node, 1 cm.  Small rounded lymph node 8 mm at level 2B on the left side.  Intense palatine tonsillar uptake with asymmetrical fullness of tonsillar tissue and symmetrical FDG activity with masslike changes on the left side measuring 2.2 x 1.7 cm compared to the right.  SUV 16.2.  Small focus of increased activity in the right neck adjacent to or within the small nodule in the right hemithyroid. -I have recommended concurrent chemoradiation therapy with weekly cisplatin.  We talked about the normal prognosis of p16 positive tonsillar cancer. -We will make a referral for dietary and swallow evaluation. -We will make a referral for port placement. -We will also make referral for radiation oncology.   All questions were answered. The patient knows to call the clinic with any problems, questions or concerns.  Derek Jack, MD, 07/26/19 1:01 PM  [place] [phone]   I, Jacqualyn Posey, am acting as a scribe for Dr. Sanda Linger.  I, Derek Jack MD, have reviewed the above documentation for accuracy and completeness, and I agree with the above.

## 2019-07-27 ENCOUNTER — Encounter: Payer: Self-pay | Admitting: General Surgery

## 2019-07-27 ENCOUNTER — Other Ambulatory Visit (HOSPITAL_COMMUNITY)
Admission: RE | Admit: 2019-07-27 | Discharge: 2019-07-27 | Disposition: A | Discharge status: Home / Self Care | Payer: PPO | Source: Ambulatory Visit | Attending: General Surgery | Admitting: General Surgery

## 2019-07-27 ENCOUNTER — Other Ambulatory Visit: Payer: Self-pay

## 2019-07-27 ENCOUNTER — Encounter (HOSPITAL_COMMUNITY)
Admission: RE | Admit: 2019-07-27 | Discharge: 2019-07-27 | Disposition: A | Discharge status: Home / Self Care | Payer: PPO | Source: Ambulatory Visit | Attending: General Surgery | Admitting: General Surgery

## 2019-07-27 ENCOUNTER — Ambulatory Visit: Payer: PPO | Admitting: General Surgery

## 2019-07-27 VITALS — BP 156/81 | HR 66 | Temp 97.2°F | Resp 14 | Ht 66.0 in | Wt 213.0 lb

## 2019-07-27 DIAGNOSIS — Z01812 Encounter for preprocedural laboratory examination: Secondary | ICD-10-CM | POA: Insufficient documentation

## 2019-07-27 DIAGNOSIS — C099 Malignant neoplasm of tonsil, unspecified: Secondary | ICD-10-CM | POA: Diagnosis not present

## 2019-07-27 DIAGNOSIS — Z20822 Contact with and (suspected) exposure to covid-19: Secondary | ICD-10-CM | POA: Diagnosis not present

## 2019-07-27 LAB — SARS CORONAVIRUS 2 (TAT 6-24 HRS): SARS Coronavirus 2: NEGATIVE

## 2019-07-27 NOTE — Progress Notes (Signed)
Kristi Weiss; [id number]; 10-19-1945   HPI Patient is a 74 year old white female who was referred to my care by Dr. Delton Coombes for Port-A-Cath placement. 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 She currently has 0 out of 10 pain. Past Medical History:  Diagnosis Date  . Arthritis   . GERD (gastroesophageal reflux disease)   . Hypothyroidism   . 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 Scoliosis   . Sinusitis   . 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    Past Surgical History:  Procedure Laterality Date  . NO PAST SURGERIES    . No prior surgery      Family History  Problem Relation Age of Onset  . Lung cancer Mother        smoker  . Cancer Mother        Throat  . Lung cancer Father        smoker  . Diabetes Father   . CVA Brother   . Diabetes Brother   . Diabetes Maternal Grandmother   . Heart attack Paternal Grandfather   . Migraines Neg Hx   . Headache Neg Hx     Current Outpatient Medications on File Prior to Visit  Medication Sig Dispense Refill  . esomeprazole (NEXIUM) 40 MG capsule Take 40 mg by mouth daily.     . fluticasone (FLONASE) 50 MCG/ACT nasal spray Place 1 spray into both nostrils daily as needed for allergies or rhinitis.    Marland Kitchen ibuprofen (ADVIL) 200 MG tablet Take 400-600 mg by mouth every 8 (eight) hours as needed for moderate pain.     Marland Kitchen levothyroxine (SYNTHROID) 50 MCG tablet Take 50 mcg by mouth daily. BRAND     No current facility-administered medications on file prior to visit.    Allergies  Allergen Reactions  . Sulfamethoxazole-Trimethoprim Other (See Comments)    Headaches  . Penicillins Rash    Social History   Substance and Sexual Activity  Alcohol Use Yes    Comment: on rare occasion "like twice a year"    Social History   Tobacco Use  Smoking Status Former Smoker  Smokeless Tobacco Never Used  Tobacco Comment   some in her 85s and 30s    Review of Systems  Constitutional: Negative.   HENT: Positive for sinus pain.   Eyes: Negative.   Respiratory: Negative.   Cardiovascular: Negative.   Gastrointestinal: Positive for heartburn.  Genitourinary: Negative.   Musculoskeletal: Positive for back pain.  Skin: Negative.   Neurological: Negative.   Endo/Heme/Allergies: Negative.   Psychiatric/Behavioral: Negative.     Objective   Vitals:   07/27/19 1011  BP: (!) 156/81  Pulse: 66  Resp: 14  Temp: (!) 97.2 F (36.2 C)  SpO2: 96%    Physical Exam Vitals reviewed.  Constitutional:      Appearance: Normal appearance. She is not ill-appearing.  HENT:     Head: Normocephalic and atraumatic.  Cardiovascular:     Rate and Rhythm: Normal rate and regular rhythm.     Heart sounds: Normal heart sounds. No murmur. No friction rub. No gallop.   Pulmonary:     Effort: Pulmonary effort is normal. No respiratory distress.  Breath sounds: Normal breath sounds. No stridor. No wheezing, rhonchi or rales.  Skin:    General: Skin is warm and dry.  Neurological:     Mental Status: She is alert and oriented to person, place, and time.    Dr. Tomie China notes reviewed Assessment  Left tonsillar carcinoma, need for central venous access Plan   Patient is scheduled for a Port-A-Cath insertion on 08/01/2019. The risks and benefits of the procedure including bleeding, infection, and pneumothorax were fully explained to the patient, who gave informed consent. Will leave port accessed.

## 2019-07-27 NOTE — Patient Instructions (Signed)
Kristi Weiss  07/27/2019     @PREFPERIOPPHARMACY @   Your procedure is scheduled on  08/01/2019.  Report to [facility] at  [place].  Call this number if you have problems the morning of surgery:  [phone]   Remember:  Do not eat or drink after midnight.                        Take these medicines the morning of surgery with A SIP OF WATER  Nexium, levothyroxine.    Do not wear jewelry, make-up or nail polish.  Do not wear lotions, powders, or perfumes, or deodorant. Please brush your teeth.  Do not shave 48 hours prior to surgery.  Men may shave face and neck.  Do not bring valuables to the hospital.  [facility] is not responsible for any belongings or valuables.  Contacts, dentures or bridgework may not be worn into surgery.  Leave your suitcase in the car.  After surgery it may be brought to your room.  For patients admitted to the hospital, discharge time will be determined by your treatment team.  Patients discharged the day of surgery will not be allowed to drive home.   Name and phone number of your driver:   family Special instructions:  DO NOT smoke the morning of your procedure.  Please read over the following fact sheets that you were given. Anesthesia Post-op Instructions and Care and Recovery After Surgery       Implanted Port Insertion, Care After This sheet gives you information about how to care for yourself after your procedure. Your health care provider may also give you more specific instructions. If you have problems or questions, contact your health care provider. What can I expect after the procedure? After the procedure, it is common to have:  Discomfort at the port insertion site.  Bruising on the skin over the port. This should improve over 3-4 days. Follow these instructions at home: [facility] care  After your port is placed, you will get a manufacturer's information card. The card has information about your port. Keep this  card with you at all times.  Take care of the port as told by your health care provider. Ask your health care provider if you or a family member can get training for taking care of the port at home. A home health care nurse may also take care of the port.  Make sure to remember what type of port you have. Incision care      Follow instructions from your health care provider about how to take care of your port insertion site. Make sure you: ? Wash your hands with soap and water before and after you change your bandage (dressing). If soap and water are not available, use hand sanitizer. ? Change your dressing as told by your health care provider. ? Leave stitches (sutures), skin glue, or adhesive strips in place. These skin closures may need to stay in place for 2 weeks or longer. If adhesive strip edges start to loosen and curl up, you may trim the loose edges. Do not remove adhesive strips completely unless your health care provider tells you to do that.  Check your port insertion site every day for signs of infection. Check for: ? Redness, swelling, or pain. ? Fluid or blood. ? Warmth. ? Pus or a bad smell. Activity  Return to your normal activities as told by  your health care provider. Ask your health care provider what activities are safe for you.  Do not lift anything that is heavier than 10 lb (4.5 kg), or the limit that you are told, until your health care provider says that it is safe. General instructions  Take over-the-counter and prescription medicines only as told by your health care provider.  Do not take baths, swim, or use a hot tub until your health care provider approves. Ask your health care provider if you may take showers. You may only be allowed to take sponge baths.  Do not drive for 24 hours if you were given a sedative during your procedure.  Wear a medical alert bracelet in case of an emergency. This will tell any health care providers that you have a  port.  Keep all follow-up visits as told by your health care provider. This is important. Contact a health care provider if:  You cannot flush your port with saline as directed, or you cannot draw blood from the port.  You have a fever or chills.  You have redness, swelling, or pain around your port insertion site.  You have fluid or blood coming from your port insertion site.  Your port insertion site feels warm to the touch.  You have pus or a bad smell coming from the port insertion site. Get help right away if:  You have chest pain or shortness of breath.  You have bleeding from your port that you cannot control. Summary  Take care of the port as told by your health care provider. Keep the manufacturer's information card with you at all times.  Change your dressing as told by your health care provider.  Contact a health care provider if you have a fever or chills or if you have redness, swelling, or pain around your port insertion site.  Keep all follow-up visits as told by your health care provider. This information is not intended to replace advice given to you by your health care provider. Make sure you discuss any questions you have with your health care provider. Document Revised: 09/14/2017 Document Reviewed: 09/14/2017 Elsevier Patient Education  [place] An implanted port is a device that is placed under the skin. It is usually placed in the chest. The device can be used to give IV medicine, to take blood, or for dialysis. You may have an implanted port if:  You need IV medicine that would be irritating to the small veins in your hands or arms.  You need IV medicines, such as antibiotics, for a long period of time.  You need IV nutrition for a long period of time.  You need dialysis. Having a port means that your health care provider will not need to use the veins in your arms for these procedures. You may have fewer  limitations when using a port than you would if you used other types of long-term IVs, and you will likely be able to return to normal activities after your incision heals. An implanted port has two main parts:  Reservoir. The reservoir is the part where a needle is inserted to give medicines or draw blood. The reservoir is round. After it is placed, it appears as a small, raised area under your skin.  Catheter. The catheter is a thin, flexible tube that connects the reservoir to a vein. Medicine that is inserted into the reservoir goes into the catheter and then into the vein. How is my  port accessed? To access your port:  A numbing cream may be placed on the skin over the port site.  Your health care provider will put on a mask and sterile gloves.  The skin over your port will be cleaned carefully with a germ-killing soap and allowed to dry.  Your health care provider will gently pinch the port and insert a needle into it.  Your health care provider will check for a blood return to make sure the port is in the vein and is not clogged.  If your port needs to remain accessed to get medicine continuously (constant infusion), your health care provider will place a clear bandage (dressing) over the needle site. The dressing and needle will need to be changed every week, or as told by your health care provider. What is flushing? Flushing helps keep the port from getting clogged. Follow instructions from your health care provider about how and when to flush the port. Ports are usually flushed with saline solution or a medicine called heparin. The need for flushing will depend on how the port is used:  If the port is only used from time to time to give medicines or draw blood, the port may need to be flushed: ? Before and after medicines have been given. ? Before and after blood has been drawn. ? As part of routine maintenance. Flushing may be recommended every 4-6 weeks.  If a constant  infusion is running, the port may not need to be flushed.  Throw away any syringes in a disposal container that is meant for sharp items (sharps container). You can buy a sharps container from a pharmacy, or you can make one by using an empty hard plastic bottle with a cover. How long will my port stay implanted? The port can stay in for as long as your health care provider thinks it is needed. When it is time for the port to come out, a surgery will be done to remove it. The surgery will be similar to the procedure that was done to put the port in. Follow these instructions at home:   Flush your port as told by your health care provider.  If you need an infusion over several days, follow instructions from your health care provider about how to take care of your port site. Make sure you: ? Wash your hands with soap and water before you change your dressing. If soap and water are not available, use alcohol-based hand sanitizer. ? Change your dressing as told by your health care provider. ? Place any used dressings or infusion bags into a plastic bag. Throw that bag in the trash. ? Keep the dressing that covers the needle clean and dry. Do not get it wet. ? Do not use scissors or sharp objects near the tube. ? Keep the tube clamped, unless it is being used.  Check your port site every day for signs of infection. Check for: ? Redness, swelling, or pain. ? Fluid or blood. ? Pus or a bad smell.  Protect the skin around the port site. ? Avoid wearing bra straps that rub or irritate the site. ? Protect the skin around your port from seat belts. Place a soft pad over your chest if needed.  Bathe or shower as told by your health care provider. The site may get wet as long as you are not actively receiving an infusion.  Return to your normal activities as told by your health care provider. Ask your health care  provider what activities are safe for you.  Carry a medical alert card or wear a  medical alert bracelet at all times. This will let health care providers know that you have an implanted port in case of an emergency. Get help right away if:  You have redness, swelling, or pain at the port site.  You have fluid or blood coming from your port site.  You have pus or a bad smell coming from the port site.  You have a fever. Summary  Implanted ports are usually placed in the chest for long-term IV access.  Follow instructions from your health care provider about flushing the port and changing bandages (dressings).  Take care of the area around your port by avoiding clothing that puts pressure on the area, and by watching for signs of infection.  Protect the skin around your port from seat belts. Place a soft pad over your chest if needed.  Get help right away if you have a fever or you have redness, swelling, pain, drainage, or a bad smell at the port site. This information is not intended to replace advice given to you by your health care provider. Make sure you discuss any questions you have with your health care provider. Document Revised: 06/10/2018 Document Reviewed: 03/21/2016 Elsevier Patient Education  2020 [place] After These instructions provide you with information about caring for yourself after your procedure. Your health care provider may also give you more specific instructions. Your treatment has been planned according to current medical practices, but problems sometimes occur. Call your health care provider if you have any problems or questions after your procedure. What can I expect after the procedure? After your procedure, you may:  Feel sleepy for several hours.  Feel clumsy and have poor balance for several hours.  Feel forgetful about what happened after the procedure.  Have poor judgment for several hours.  Feel nauseous or vomit.  Have a sore throat if you had a breathing tube during the  procedure. Follow these instructions at home: For at least 24 hours after the procedure:      Have a responsible adult stay with you. It is important to have someone help care for you until you are awake and alert.  Rest as needed.  Do not: ? Participate in activities in which you could fall or become injured. ? Drive. ? Use heavy machinery. ? Drink alcohol. ? Take sleeping pills or medicines that cause drowsiness. ? Make important decisions or sign legal documents. ? Take care of children on your own. Eating and drinking  Follow the diet that is recommended by your health care provider.  If you vomit, drink water, juice, or soup when you can drink without vomiting.  Make sure you have little or no nausea before eating solid foods. General instructions  Take over-the-counter and prescription medicines only as told by your health care provider.  If you have sleep apnea, surgery and certain medicines can increase your risk for breathing problems. Follow instructions from your health care provider about wearing your sleep device: ? Anytime you are sleeping, including during daytime naps. ? While taking prescription pain medicines, sleeping medicines, or medicines that make you drowsy.  If you smoke, do not smoke without supervision.  Keep all follow-up visits as told by your health care provider. This is important. Contact a health care provider if:  You keep feeling nauseous or you keep vomiting.  You feel light-headed.  You develop  a rash.  You have a fever. Get help right away if:  You have trouble breathing. Summary  For several hours after your procedure, you may feel sleepy and have poor judgment.  Have a responsible adult stay with you for at least 24 hours or until you are awake and alert. This information is not intended to replace advice given to you by your health care provider. Make sure you discuss any questions you have with your health care  provider. Document Revised: 05/17/2017 Document Reviewed: 06/09/2015 Elsevier Patient Education  [place].

## 2019-07-27 NOTE — Patient Instructions (Signed)

## 2019-07-27 NOTE — H&P (Signed)
Kristi Weiss; [id number]; 1946-01-25   HPI Patient is a 74 year old white female who was referred to my care by Dr. Delton Coombes for Port-A-Cath placement. 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 She currently has 0 out of 10 pain. Past Medical History:  Diagnosis Date  . Arthritis   . GERD (gastroesophageal reflux disease)   . Hypothyroidism   . 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 Scoliosis   . Sinusitis   . 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    Past Surgical History:  Procedure Laterality Date  . NO PAST SURGERIES    . No prior surgery      Family History  Problem Relation Age of Onset  . Lung cancer Mother        smoker  . Cancer Mother        Throat  . Lung cancer Father        smoker  . Diabetes Father   . CVA Brother   . Diabetes Brother   . Diabetes Maternal Grandmother   . Heart attack Paternal Grandfather   . Migraines Neg Hx   . Headache Neg Hx     Current Outpatient Medications on File Prior to Visit  Medication Sig Dispense Refill  . esomeprazole (NEXIUM) 40 MG capsule Take 40 mg by mouth daily.     . fluticasone (FLONASE) 50 MCG/ACT nasal spray Place 1 spray into both nostrils daily as needed for allergies or rhinitis.    Marland Kitchen ibuprofen (ADVIL) 200 MG tablet Take 400-600 mg by mouth every 8 (eight) hours as needed for moderate pain.     Marland Kitchen levothyroxine (SYNTHROID) 50 MCG tablet Take 50 mcg by mouth daily. BRAND     No current facility-administered medications on file prior to visit.    Allergies  Allergen Reactions  . Sulfamethoxazole-Trimethoprim Other (See Comments)    Headaches  . Penicillins Rash    Social History   Substance and Sexual Activity  Alcohol Use Yes    Comment: on rare occasion "like twice a year"    Social History   Tobacco Use  Smoking Status Former Smoker  Smokeless Tobacco Never Used  Tobacco Comment   some in her 40s and 30s    Review of Systems  Constitutional: Negative.   HENT: Positive for sinus pain.   Eyes: Negative.   Respiratory: Negative.   Cardiovascular: Negative.   Gastrointestinal: Positive for heartburn.  Genitourinary: Negative.   Musculoskeletal: Positive for back pain.  Skin: Negative.   Neurological: Negative.   Endo/Heme/Allergies: Negative.   Psychiatric/Behavioral: Negative.     Objective   Vitals:   07/27/19 1011  BP: (!) 156/81  Pulse: 66  Resp: 14  Temp: (!) 97.2 F (36.2 C)  SpO2: 96%    Physical Exam Vitals reviewed.  Constitutional:      Appearance: Normal appearance. She is not ill-appearing.  HENT:     Head: Normocephalic and atraumatic.  Cardiovascular:     Rate and Rhythm: Normal rate and regular rhythm.     Heart sounds: Normal heart sounds. No murmur. No friction rub. No gallop.   Pulmonary:     Effort: Pulmonary effort is normal. No respiratory distress.  Breath sounds: Normal breath sounds. No stridor. No wheezing, rhonchi or rales.  Skin:    General: Skin is warm and dry.  Neurological:     Mental Status: She is alert and oriented to person, place, and time.    Dr. Tomie China notes reviewed Assessment  Left tonsillar carcinoma, need for central venous access Plan   Patient is scheduled for a Port-A-Cath insertion on 08/01/2019. The risks and benefits of the procedure including bleeding, infection, and pneumothorax were fully explained to the patient, who gave informed consent. Will leave port accessed.

## 2019-07-28 ENCOUNTER — Ambulatory Visit (HOSPITAL_COMMUNITY): Payer: PPO

## 2019-07-28 NOTE — Progress Notes (Signed)
Nutrition Assessment:  Referral for new head and neck cancer.   74 year old female with left tonsillar cancer, p 16 +.  Past medical history of GERD, hypothyroidism, SCC of left forehead in 204, vulvar dysplasia.  Planning concurrent chemotherapy and radiation therapy.   Spoke with patient via phone for nutrition assessment.  Patient reports that she has a normal/good appetite.  "I like all foods except for liver."  Reports that she has a daughter who works and she has a grandson with autism.  She typically eats 2 good meals per day.  Brunch around [id number] of 1 1/2 slices bacon, 1-2 eggs on 2 slices wheat toast with tomato and mayo sandwich.  May have a light snack/meal in the afternoon.  Eats dinner with daughter around 7:30-8pm of meat and vegetables, sides.  Denies swallowing any particular foods.     Medications: nexium, synthyriod  Labs: reviewed  Anthropometrics:   Height: 66 inches Weight: 213 lb UBW: 213 lb.  Has been attending [employer] and wanting to loose some weight BMI: 34   Estimated Energy Needs  Kcals: 2400-2800 Protein: 120-140 g Fluid: > 2.4 L  NUTRITION DIAGNOSIS: Predicted sub optimal energy intake related to new head and neck cancer as evidenced by treatment related side effects that will effect nutrition   INTERVENTION:  Discussed importance of nutrition and weight maintenance during treatment.  Discussed RD's role during treatment. Discussed importance of including foods with protein and well-balanced diet.  Will leave handout on soft moist protein foods.  Discussed oral nutrition supplements and will provide samples for patient to pick up on 6/7, next appointment.   Contact information provided    MONITORING, EVALUATION, GOAL: weight trends, intake   NEXT VISIT: to be determined with treatment  Joli B. Zenia Resides, [place], [place] Registered Dietitian [phone] (pager)

## 2019-08-01 ENCOUNTER — Ambulatory Visit (HOSPITAL_COMMUNITY)
Admission: RE | Admit: 2019-08-01 | Discharge: 2019-08-01 | Disposition: A | Discharge status: Home / Self Care | Payer: PPO | Attending: General Surgery | Admitting: General Surgery

## 2019-08-01 ENCOUNTER — Ambulatory Visit (HOSPITAL_COMMUNITY): Payer: PPO | Admitting: Anesthesiology

## 2019-08-01 ENCOUNTER — Ambulatory Visit (HOSPITAL_COMMUNITY): Payer: PPO

## 2019-08-01 ENCOUNTER — Encounter (HOSPITAL_COMMUNITY): Payer: Self-pay | Admitting: General Surgery

## 2019-08-01 ENCOUNTER — Encounter (HOSPITAL_COMMUNITY)
Admission: RE | Disposition: A | Discharge status: Home / Self Care | Payer: Self-pay | Source: Home / Self Care | Attending: General Surgery

## 2019-08-01 DIAGNOSIS — E042 Nontoxic multinodular goiter: Secondary | ICD-10-CM | POA: Diagnosis not present

## 2019-08-01 DIAGNOSIS — M1990 Unspecified osteoarthritis, unspecified site: Secondary | ICD-10-CM | POA: Insufficient documentation

## 2019-08-01 DIAGNOSIS — Z79899 Other long term (current) drug therapy: Secondary | ICD-10-CM | POA: Diagnosis not present

## 2019-08-01 DIAGNOSIS — I872 Venous insufficiency (chronic) (peripheral): Secondary | ICD-10-CM | POA: Diagnosis not present

## 2019-08-01 DIAGNOSIS — Z87891 Personal history of nicotine dependence: Secondary | ICD-10-CM | POA: Insufficient documentation

## 2019-08-01 DIAGNOSIS — E039 Hypothyroidism, unspecified: Secondary | ICD-10-CM | POA: Diagnosis not present

## 2019-08-01 DIAGNOSIS — Z79890 Hormone replacement therapy (postmenopausal): Secondary | ICD-10-CM | POA: Diagnosis not present

## 2019-08-01 DIAGNOSIS — K219 Gastro-esophageal reflux disease without esophagitis: Secondary | ICD-10-CM | POA: Insufficient documentation

## 2019-08-01 DIAGNOSIS — Z95828 Presence of other vascular implants and grafts: Secondary | ICD-10-CM

## 2019-08-01 DIAGNOSIS — C099 Malignant neoplasm of tonsil, unspecified: Secondary | ICD-10-CM | POA: Insufficient documentation

## 2019-08-01 DIAGNOSIS — Z452 Encounter for adjustment and management of vascular access device: Secondary | ICD-10-CM | POA: Diagnosis not present

## 2019-08-01 DIAGNOSIS — C090 Malignant neoplasm of tonsillar fossa: Secondary | ICD-10-CM | POA: Diagnosis not present

## 2019-08-01 HISTORY — PX: PORTACATH PLACEMENT: SHX2246

## 2019-08-01 SURGERY — INSERTION, TUNNELED CENTRAL VENOUS DEVICE, WITH PORT
Anesthesia: General | Site: Chest | Laterality: Right

## 2019-08-01 MED ORDER — TRAMADOL HCL 50 MG PO TABS: 50.0000 mg | ORAL_TABLET | Freq: Four times a day (QID) | ORAL | 0 refills | Status: DC | PRN

## 2019-08-01 MED ORDER — PROPOFOL 10 MG/ML IV BOLUS
INTRAVENOUS | Status: AC
  Filled 2019-08-01: qty 20

## 2019-08-01 MED ORDER — CHLORHEXIDINE GLUCONATE 0.12 % MT SOLN
15.0000 mL | Freq: Once | OROMUCOSAL | Status: AC
  Administered 2019-08-01: 15 mL via OROMUCOSAL

## 2019-08-01 MED ORDER — SODIUM CHLORIDE (PF) 0.9 % IJ SOLN
INTRAMUSCULAR | Status: DC | PRN
  Administered 2019-08-01: 5 mL

## 2019-08-01 MED ORDER — ONDANSETRON HCL 4 MG/2ML IJ SOLN: 4.0000 mg | Freq: Once | INTRAMUSCULAR | Status: DC | PRN

## 2019-08-01 MED ORDER — ORAL CARE MOUTH RINSE: 15.0000 mL | Freq: Once | OROMUCOSAL | Status: AC

## 2019-08-01 MED ORDER — PROPOFOL 500 MG/50ML IV EMUL
INTRAVENOUS | Status: DC | PRN
  Administered 2019-08-01: 150 ug/kg/min via INTRAVENOUS

## 2019-08-01 MED ORDER — VANCOMYCIN HCL IN DEXTROSE 1-5 GM/200ML-% IV SOLN
INTRAVENOUS | Status: AC
  Filled 2019-08-01: qty 200

## 2019-08-01 MED ORDER — HEPARIN SOD (PORK) LOCK FLUSH 100 UNIT/ML IV SOLN
INTRAVENOUS | Status: AC
  Filled 2019-08-01: qty 5

## 2019-08-01 MED ORDER — KETAMINE HCL 10 MG/ML IJ SOLN
INTRAMUSCULAR | Status: DC | PRN
  Administered 2019-08-01: 20 mg via INTRAVENOUS

## 2019-08-01 MED ORDER — CHLORHEXIDINE GLUCONATE CLOTH 2 % EX PADS: 6.0000 | MEDICATED_PAD | Freq: Once | CUTANEOUS | Status: DC

## 2019-08-01 MED ORDER — LIDOCAINE HCL (PF) 1 % IJ SOLN
INTRAMUSCULAR | Status: AC
  Filled 2019-08-01: qty 30

## 2019-08-01 MED ORDER — HEPARIN SOD (PORK) LOCK FLUSH 100 UNIT/ML IV SOLN
INTRAVENOUS | Status: DC | PRN
  Administered 2019-08-01: 5 [IU]

## 2019-08-01 MED ORDER — MEPERIDINE HCL 50 MG/ML IJ SOLN: 6.2500 mg | INTRAMUSCULAR | Status: DC | PRN

## 2019-08-01 MED ORDER — LIDOCAINE HCL (PF) 1 % IJ SOLN
INTRAMUSCULAR | Status: DC | PRN
  Administered 2019-08-01: 8 mL

## 2019-08-01 MED ORDER — VANCOMYCIN HCL IN DEXTROSE 1-5 GM/200ML-% IV SOLN
1000.0000 mg | INTRAVENOUS | Status: AC
  Administered 2019-08-01: 1000 mg via INTRAVENOUS

## 2019-08-01 MED ORDER — LACTATED RINGERS IV SOLN
INTRAVENOUS | Status: DC | PRN
Start: 2019-08-01 — End: 2019-08-01

## 2019-08-01 MED ORDER — LACTATED RINGERS IV SOLN: Freq: Once | INTRAVENOUS | Status: AC

## 2019-08-01 MED ORDER — KETOROLAC TROMETHAMINE 30 MG/ML IJ SOLN
15.0000 mg | Freq: Once | INTRAMUSCULAR | Status: AC
  Administered 2019-08-01: 15 mg via INTRAVENOUS
  Filled 2019-08-01: qty 1

## 2019-08-01 MED ORDER — HYDROMORPHONE HCL 1 MG/ML IJ SOLN: 0.2500 mg | INTRAMUSCULAR | Status: DC | PRN

## 2019-08-01 MED ORDER — KETAMINE HCL 50 MG/5ML IJ SOSY
PREFILLED_SYRINGE | INTRAMUSCULAR | Status: AC
  Filled 2019-08-01: qty 5

## 2019-08-01 SURGICAL SUPPLY — 33 items
ADH SKN CLS APL DERMABOND .7 (GAUZE/BANDAGES/DRESSINGS) ×1
APL PRP STRL LF ISPRP CHG 10.5 (MISCELLANEOUS) ×1
APPLICATOR CHLORAPREP 10.5 ORG (MISCELLANEOUS) ×3 IMPLANT
BAG DECANTER FOR FLEXI CONT (MISCELLANEOUS) ×3 IMPLANT
CLOTH BEACON ORANGE TIMEOUT ST (SAFETY) ×3 IMPLANT
COVER LIGHT HANDLE STERIS (MISCELLANEOUS) ×6 IMPLANT
COVER WAND RF STERILE (DRAPES) ×3 IMPLANT
DECANTER SPIKE VIAL GLASS SM (MISCELLANEOUS) ×3 IMPLANT
DERMABOND ADVANCED (GAUZE/BANDAGES/DRESSINGS) ×2
DERMABOND ADVANCED .7 DNX12 (GAUZE/BANDAGES/DRESSINGS) ×1 IMPLANT
DRAPE C-ARM FOLDED MOBILE STRL (DRAPES) ×3 IMPLANT
DRSG TEGADERM 2-3/8X2-3/4 SM (GAUZE/BANDAGES/DRESSINGS) ×2 IMPLANT
ELECT REM PT RETURN 9FT ADLT (ELECTROSURGICAL) ×3
ELECTRODE REM PT RTRN 9FT ADLT (ELECTROSURGICAL) ×1 IMPLANT
GAUZE SPONGE 2X2 12PLY NS (GAUZE/BANDAGES/DRESSINGS) ×2 IMPLANT
GLOVE BIOGEL PI IND STRL 7.0 (GLOVE) ×2 IMPLANT
GLOVE BIOGEL PI INDICATOR 7.0 (GLOVE) ×4
GLOVE SURG SS PI 7.5 STRL IVOR (GLOVE) ×3 IMPLANT
GOWN STRL REUS W/TWL LRG LVL3 (GOWN DISPOSABLE) ×6 IMPLANT
IV NS 500ML (IV SOLUTION) ×3
IV NS 500ML BAXH (IV SOLUTION) ×1 IMPLANT
KIT PORT POWER 8FR ISP MRI (Port) ×3 IMPLANT
KIT TURNOVER KIT A (KITS) ×3 IMPLANT
NDL HYPO 25X1 1.5 SAFETY (NEEDLE) ×1 IMPLANT
NEEDLE HYPO 25X1 1.5 SAFETY (NEEDLE) ×3 IMPLANT
PACK MINOR (CUSTOM PROCEDURE TRAY) ×3 IMPLANT
PAD ARMBOARD 7.5X6 YLW CONV (MISCELLANEOUS) ×3 IMPLANT
SET BASIN LINEN APH (SET/KITS/TRAYS/PACK) ×3 IMPLANT
SUT MNCRL AB 4-0 PS2 18 (SUTURE) ×3 IMPLANT
SUT VIC AB 3-0 SH 27 (SUTURE) ×3
SUT VIC AB 3-0 SH 27X BRD (SUTURE) ×1 IMPLANT
SYR 5ML LL (SYRINGE) ×3 IMPLANT
SYR CONTROL 10ML LL (SYRINGE) ×3 IMPLANT

## 2019-08-01 NOTE — Anesthesia Preprocedure Evaluation (Addendum)
Anesthesia Evaluation  Patient identified by MRN, date of birth, ID band Patient awake    Reviewed: Allergy & Precautions, NPO status , Patient's Chart, lab work & pertinent test results  Airway Mallampati: III  TM Distance: >3 FB Neck ROM: Full    Dental  (+) Caps, Dental Advisory Given,    Pulmonary former smoker,    Pulmonary exam normal breath sounds clear to auscultation       Cardiovascular Exercise Tolerance: Good Normal cardiovascular exam Rhythm:Regular Rate:Normal - Systolic murmurs, - Diastolic murmurs, - Friction Rub, - Carotid Bruit, - Peripheral Edema and - Systolic Click    Neuro/Psych  Headaches, negative psych ROS   GI/Hepatic GERD  Medicated and Controlled,  Endo/Other  Hypothyroidism   Renal/GU      Musculoskeletal  (+) Arthritis ,   Abdominal   Peds  Hematology   Anesthesia Other Findings   Reproductive/Obstetrics                           Anesthesia Physical Anesthesia Plan  ASA: II  Anesthesia Plan: General   Post-op Pain Management:    Induction: Intravenous  PONV Risk Score and Plan:   Airway Management Planned: Nasal Cannula, Natural Airway and Simple Face Mask  Additional Equipment:   Intra-op Plan:   Post-operative Plan:   Informed Consent: I have reviewed the patients History and Physical, chart, labs and discussed the procedure including the risks, benefits and alternatives for the proposed anesthesia with the patient or authorized representative who has indicated his/her understanding and acceptance.     Dental advisory given  Plan Discussed with: CRNA and Surgeon  Anesthesia Plan Comments:         Anesthesia Quick Evaluation

## 2019-08-01 NOTE — Transfer of Care (Signed)
Immediate Anesthesia Transfer of Care Note  Patient: Kristi Weiss  Procedure(s) Performed: INSERTION PORT-A-CATH (Right Chest)  Patient Location: PACU  Anesthesia Type:General  Level of Consciousness: awake, alert , oriented and patient cooperative  Airway & Oxygen Therapy: Patient Spontanous Breathing  Post-op Assessment: Report given to RN, Post -op Vital signs reviewed and stable and Patient moving all extremities X 4  Post vital signs: Reviewed and stable  Last Vitals:  Vitals Value Taken Time  BP    Temp    Pulse    Resp    SpO2      Last Pain:  Vitals:   08/01/19 0710  TempSrc: Oral  PainSc: 2          Complications: No apparent anesthesia complications

## 2019-08-01 NOTE — Discharge Instructions (Signed)
Implanted Port Home Guide An implanted port is a device that is placed under the skin. It is usually placed in the chest. The device can be used to give IV medicine, to take blood, or for dialysis. You may have an implanted port if:  You need IV medicine that would be irritating to the small veins in your hands or arms.  You need IV medicines, such as antibiotics, for a long period of time.  You need IV nutrition for a long period of time.  You need dialysis. Having a port means that your health care provider will not need to use the veins in your arms for these procedures. You may have fewer limitations when using a port than you would if you used other types of long-term IVs, and you will likely be able to return to normal activities after your incision heals. An implanted port has two main parts:  Reservoir. The reservoir is the part where a needle is inserted to give medicines or draw blood. The reservoir is round. After it is placed, it appears as a small, raised area under your skin.  Catheter. The catheter is a thin, flexible tube that connects the reservoir to a vein. Medicine that is inserted into the reservoir goes into the catheter and then into the vein. How is my port accessed? To access your port:  A numbing cream may be placed on the skin over the port site.  Your health care provider will put on a mask and sterile gloves.  The skin over your port will be cleaned carefully with a germ-killing soap and allowed to dry.  Your health care provider will gently pinch the port and insert a needle into it.  Your health care provider will check for a blood return to make sure the port is in the vein and is not clogged.  If your port needs to remain accessed to get medicine continuously (constant infusion), your health care provider will place a clear bandage (dressing) over the needle site. The dressing and needle will need to be changed every week, or as told by your health care  provider. What is flushing? Flushing helps keep the port from getting clogged. Follow instructions from your health care provider about how and when to flush the port. Ports are usually flushed with saline solution or a medicine called heparin. The need for flushing will depend on how the port is used:  If the port is only used from time to time to give medicines or draw blood, the port may need to be flushed: ? Before and after medicines have been given. ? Before and after blood has been drawn. ? As part of routine maintenance. Flushing may be recommended every 4-6 weeks.  If a constant infusion is running, the port may not need to be flushed.  Throw away any syringes in a disposal container that is meant for sharp items (sharps container). You can buy a sharps container from a pharmacy, or you can make one by using an empty hard plastic bottle with a cover. How long will my port stay implanted? The port can stay in for as long as your health care provider thinks it is needed. When it is time for the port to come out, a surgery will be done to remove it. The surgery will be similar to the procedure that was done to put the port in. Follow these instructions at home:   Flush your port as told by your health care provider.    If you need an infusion over several days, follow instructions from your health care provider about how to take care of your port site. Make sure you: ? Wash your hands with soap and water before you change your dressing. If soap and water are not available, use alcohol-based hand sanitizer. ? Change your dressing as told by your health care provider. ? Place any used dressings or infusion bags into a plastic bag. Throw that bag in the trash. ? Keep the dressing that covers the needle clean and dry. Do not get it wet. ? Do not use scissors or sharp objects near the tube. ? Keep the tube clamped, unless it is being used.  Check your port site every day for signs of  infection. Check for: ? Redness, swelling, or pain. ? Fluid or blood. ? Pus or a bad smell.  Protect the skin around the port site. ? Avoid wearing bra straps that rub or irritate the site. ? Protect the skin around your port from seat belts. Place a soft pad over your chest if needed.  Bathe or shower as told by your health care provider. The site may get wet as long as you are not actively receiving an infusion.  Return to your normal activities as told by your health care provider. Ask your health care provider what activities are safe for you.  Carry a medical alert card or wear a medical alert bracelet at all times. This will let health care providers know that you have an implanted port in case of an emergency. Get help right away if:  You have redness, swelling, or pain at the port site.  You have fluid or blood coming from your port site.  You have pus or a bad smell coming from the port site.  You have a fever. Summary  Implanted ports are usually placed in the chest for long-term IV access.  Follow instructions from your health care provider about flushing the port and changing bandages (dressings).  Take care of the area around your port by avoiding clothing that puts pressure on the area, and by watching for signs of infection.  Protect the skin around your port from seat belts. Place a soft pad over your chest if needed.  Get help right away if you have a fever or you have redness, swelling, pain, drainage, or a bad smell at the port site. This information is not intended to replace advice given to you by your health care provider. Make sure you discuss any questions you have with your health care provider. Document Revised: 06/10/2018 Document Reviewed: 03/21/2016 Elsevier Patient Education  2020 [employer]     General Anesthesia, Adult, Care After This sheet gives you information about how to care for yourself after your procedure. Your health care  provider may also give you more specific instructions. If you have problems or questions, contact your health care provider. What can I expect after the procedure? After the procedure, the following side effects are common:  Pain or discomfort at the IV site.  Nausea.  Vomiting.  Sore throat.  Trouble concentrating.  Feeling cold or chills.  Weak or tired.  Sleepiness and fatigue.  Soreness and body aches. These side effects can affect parts of the body that were not involved in surgery. Follow these instructions at home:  For at least 24 hours after the procedure:  Have a responsible adult stay with you. It is important to have someone help care for you until you   are awake and alert.  Rest as needed.  Do not: ? Participate in activities in which you could fall or become injured. ? Drive. ? Use heavy machinery. ? Drink alcohol. ? Take sleeping pills or medicines that cause drowsiness. ? Make important decisions or sign legal documents. ? Take care of children on your own. Eating and drinking  Follow any instructions from your health care provider about eating or drinking restrictions.  When you feel hungry, start by eating small amounts of foods that are soft and easy to digest (bland), such as toast. Gradually return to your regular diet.  Drink enough fluid to keep your urine pale yellow.  If you vomit, rehydrate by drinking water, juice, or clear broth. General instructions  If you have sleep apnea, surgery and certain medicines can increase your risk for breathing problems. Follow instructions from your health care provider about wearing your sleep device: ? Anytime you are sleeping, including during daytime naps. ? While taking prescription pain medicines, sleeping medicines, or medicines that make you drowsy.  Return to your normal activities as told by your health care provider. Ask your health care provider what activities are safe for you.  Take  over-the-counter and prescription medicines only as told by your health care provider.  If you smoke, do not smoke without supervision.  Keep all follow-up visits as told by your health care provider. This is important. Contact a health care provider if:  You have nausea or vomiting that does not get better with medicine.  You cannot eat or drink without vomiting.  You have pain that does not get better with medicine.  You are unable to pass urine.  You develop a skin rash.  You have a fever.  You have redness around your IV site that gets worse. Get help right away if:  You have difficulty breathing.  You have chest pain.  You have blood in your urine or stool, or you vomit blood. Summary  After the procedure, it is common to have a sore throat or nausea. It is also common to feel tired.  Have a responsible adult stay with you for the first 24 hours after general anesthesia. It is important to have someone help care for you until you are awake and alert.  When you feel hungry, start by eating small amounts of foods that are soft and easy to digest (bland), such as toast. Gradually return to your regular diet.  Drink enough fluid to keep your urine pale yellow.  Return to your normal activities as told by your health care provider. Ask your health care provider what activities are safe for you. This information is not intended to replace advice given to you by your health care provider. Make sure you discuss any questions you have with your health care provider. Document Revised: 02/19/2017 Document Reviewed: 10/02/2016 Elsevier Patient Education  2020 [employer]   

## 2019-08-01 NOTE — Interval H&P Note (Signed)
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  The various methods of treatment have been discussed with the patient and family. After consideration of risks, benefits and other options for treatment, the patient has consented to  Procedure(s): INSERTION PORT-A-CATH (Right) as a surgical intervention.  The patient's history has been reviewed, patient examined, no change in status, stable for surgery.  I have reviewed the patient's chart and labs.  Questions were answered to the patient's satisfaction.     Aviva Signs

## 2019-08-01 NOTE — Op Note (Signed)
Patient:  Kristi Weiss  DOB:  02-04-1946  MRN:  [id number]   Preop Diagnosis: Tonsillar carcinoma, need for central venous access  Postop Diagnosis: Same  Procedure: Port-A-Cath insertion  Surgeon: Aviva Signs, MD  Anes: MAC  Indications: Patient is a 74 year old white female with tonsillar carcinoma who prevent presents for Port-A-Cath insertion.  The risks and benefits of the procedure including bleeding, infection, and pneumothorax were fully explained to the patient, who gave informed consent.  Procedure note: The patient was placed in the Trendelenburg position after the right upper chest was prepped and draped using the usual sterile technique with ChloraPrep.  Surgical site confirmation was performed.  1% Xylocaine was used for local anesthesia.  An incision was made below the right clavicle.  A subcutaneous pocket was formed.  A needle was advanced into the right subclavian vein using the Seldinger technique without difficulty.  A guidewire was then advanced into the right atrium under fluoroscopic guidance.  The patient was noted to have scoliosis.  An introducer and peel-away sheath were placed over the guidewire.  The catheter was inserted through the peel-away sheath and the peel-away sheath was removed.  The catheter was then attached to the port and the port placed in subcutaneous pocket.  Adequate positioning was confirmed by fluoroscopy.  Good backflow of venous blood was noted on aspiration of the port.  The port was flushed with heparin flush and left accessed.  The subcutaneous layer was reapproximated using a 3-0 Vicryl interrupted suture.  The skin was closed using a 4-0 Monocryl subcuticular suture.  Dermabond was applied.  All tape and needle counts were correct at the end of the procedure.  The patient was awakened and transferred to PACU in stable condition.  A chest x-ray will be performed at that time.  Complications: None  EBL: Minimal  Specimen:  None

## 2019-08-01 NOTE — Anesthesia Postprocedure Evaluation (Signed)
Anesthesia Post Note  Patient: Kristi Weiss  Procedure(s) Performed: INSERTION PORT-A-CATH (Right Chest)  Patient location during evaluation: PACU Anesthesia Type: General Level of consciousness: awake, oriented, awake and alert and patient cooperative Pain management: satisfactory to patient Vital Signs Assessment: post-procedure vital signs reviewed and stable Respiratory status: spontaneous breathing, respiratory function stable and nonlabored ventilation Cardiovascular status: stable Postop Assessment: no apparent nausea or vomiting Anesthetic complications: no     Last Vitals:  Vitals:   08/01/19 0710  BP: (!) 161/78  Pulse: 75  Resp: 14  Temp: 36.7 C  SpO2: 95%    Last Pain:  Vitals:   08/01/19 0710  TempSrc: Oral  PainSc: 2                  Meko Bellanger

## 2019-08-07 ENCOUNTER — Encounter (HOSPITAL_BASED_OUTPATIENT_CLINIC_OR_DEPARTMENT_OTHER): Admission: RE | Payer: Self-pay | Source: Home / Self Care

## 2019-08-07 ENCOUNTER — Inpatient Hospital Stay (HOSPITAL_COMMUNITY): Payer: PPO | Attending: Hematology | Admitting: Hematology

## 2019-08-07 ENCOUNTER — Other Ambulatory Visit: Payer: Self-pay

## 2019-08-07 ENCOUNTER — Encounter (HOSPITAL_COMMUNITY): Payer: Self-pay | Admitting: Hematology

## 2019-08-07 ENCOUNTER — Ambulatory Visit (HOSPITAL_BASED_OUTPATIENT_CLINIC_OR_DEPARTMENT_OTHER): Admission: RE | Admit: 2019-08-07 | Payer: PPO | Source: Home / Self Care | Admitting: Otolaryngology

## 2019-08-07 VITALS — BP 157/78 | HR 81 | Temp 98.5°F | Resp 16 | Wt 215.1 lb

## 2019-08-07 DIAGNOSIS — C099 Malignant neoplasm of tonsil, unspecified: Secondary | ICD-10-CM | POA: Diagnosis not present

## 2019-08-07 DIAGNOSIS — Z79899 Other long term (current) drug therapy: Secondary | ICD-10-CM | POA: Diagnosis not present

## 2019-08-07 DIAGNOSIS — C090 Malignant neoplasm of tonsillar fossa: Secondary | ICD-10-CM | POA: Diagnosis not present

## 2019-08-07 DIAGNOSIS — Z5111 Encounter for antineoplastic chemotherapy: Secondary | ICD-10-CM | POA: Diagnosis not present

## 2019-08-07 DIAGNOSIS — E039 Hypothyroidism, unspecified: Secondary | ICD-10-CM | POA: Diagnosis not present

## 2019-08-07 SURGERY — LARYNGOSCOPY, DIRECT
Anesthesia: General

## 2019-08-07 NOTE — Addendum Note (Signed)
Addended by: Farley Ly on: 08/07/2019 04:47 PM   Modules accepted: Orders

## 2019-08-07 NOTE — Progress Notes (Signed)
[facility], [place] [address]   CLINIC:  Medical Oncology/Hematology  PCP:  [facility], [place] / [place] [address] [phone]   REASON FOR VISIT:  Follow-up for left tonsillar cancer  PRIOR THERAPY: None  CURRENT THERAPY: Weekly cisplatin and radiation.  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 female, returns for routine follow-up of her left tonsillar cancer. Kristi Weiss was last seen on 07/26/2019.  She already received her port. She reports that her appetite is good and she does not have any pain. She reports having headaches and sleep issues, for which she takes Benadryl. She has been on thyroid medications for her hypothyroidism for approx 20 years, but her US thyroid came back negative. She denies trouble with swallowing.   REVIEW OF SYSTEMS:  Review of Systems  Constitutional: Negative for appetite change.  HENT:    Positive for tinnitus (L ear). Negative for trouble swallowing.   Respiratory: Negative for cough.   Neurological: Positive for headaches.  Psychiatric/Behavioral: Positive for sleep disturbance. The patient is nervous/anxious.   All other systems reviewed and are negative.   PAST MEDICAL/SURGICAL HISTORY:  Past Medical History:  Diagnosis Date  . Arthritis   . GERD (gastroesophageal reflux disease)   . Hypothyroidism   . 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 Scoliosis   . Sinusitis   . 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   Past Surgical History:  Procedure Laterality Date  . NO PAST SURGERIES    . No prior surgery    . PORTACATH PLACEMENT Right 08/01/2019   Procedure: INSERTION PORT-A-CATH;  Surgeon: Aviva Signs, MD;  Location: [facility];  Service: General;  Laterality: Right;    SOCIAL HISTORY:  Social History   Socioeconomic History  . Marital status: Widowed    Spouse name: Not on file  . Number of children: 1  . Years of education: Not on file  . Highest education level: Not on file  Occupational History  . Occupation: RETIRED  Tobacco Use  . Smoking status: Former Research scientist (life sciences)  . Smokeless tobacco: Never Used  . Tobacco comment: some in her 33s and 30s  Substance and Sexual Activity  . Alcohol use: Yes  Comment: on rare occasion "like twice a year"  . Drug use: Never  . Sexual activity: Not Currently  Other Topics Concern  . Not on file  Social History Narrative   Lives at home alone   Retired   Widow   Caffeine: about 4 cups coffee, 3 glasses of tea daily   Social Determinants of Health   Financial Resource Strain: Low Risk   . Difficulty of Paying Living Expenses: Not hard at all  Food Insecurity: No Food Insecurity  . Worried  About Charity fundraiser in the Last Year: Never true  . Ran Out of Food in the Last Year: Never true  Transportation Needs: No Transportation Needs  . Lack of Transportation (Medical): No  . Lack of Transportation (Non-Medical): No  Physical Activity: Sufficiently Active  . Days of Exercise per Week: 7 days  . Minutes of Exercise per Session: 30 min  Stress: Stress Concern Present  . Feeling of Stress : Very much  Social Connections:   . Frequency of Communication with Friends and Family:   . Frequency of Social Gatherings with Friends and Family:   . Attends Religious Services:   . Active Member of Clubs or Organizations:   . Attends Archivist Meetings:   Marland Kitchen Marital Status:   Intimate Partner Violence: Not At Risk  . Fear of Current or Ex-Partner: No  . Emotionally Abused: No  . Physically Abused: No  . Sexually Abused: No    FAMILY HISTORY:  Family History  Problem Relation Age of Onset  . Lung cancer Mother        smoker  . Cancer Mother        Throat  . Lung cancer Father        smoker  . Diabetes Father   . CVA Brother   . Diabetes Brother   . Diabetes Maternal Grandmother   . Heart attack Paternal Grandfather   . Migraines Neg Hx   . Headache Neg Hx     CURRENT MEDICATIONS:  Current Outpatient Medications  Medication Sig Dispense Refill  . esomeprazole (NEXIUM) 40 MG capsule Take 40 mg by mouth daily.     Marland Kitchen ibuprofen (ADVIL) 200 MG tablet Take 400-600 mg by mouth every 8 (eight) hours as needed for moderate pain.     Marland Kitchen levothyroxine (SYNTHROID) 50 MCG tablet Take 50 mcg by mouth daily. BRAND    . PREVIDENT 5000 BOOSTER PLUS 1.1 % PSTE APPLY WITH TOOTHBRUSH AT BEDTIME     No current facility-administered medications for this visit.    ALLERGIES:  Allergies  Allergen Reactions  . Sulfamethoxazole-Trimethoprim Other (See Comments)    Headaches  . Penicillins Rash    20 years    PHYSICAL EXAM:  Performance status (ECOG): 1 - Symptomatic but  completely ambulatory  There were no vitals filed for this visit. Wt Readings from Last 3 Encounters:  07/27/19 213 lb (96.6 kg)  07/26/19 214 lb 6.4 oz (97.3 kg)  06/29/19 217 lb (98.4 kg)   Physical Exam Vitals reviewed.  Constitutional:      Appearance: Normal appearance. She is obese.  HENT:     Mouth/Throat:     Mouth: Oral lesions (L tonsil) present.  Cardiovascular:     Rate and Rhythm: Normal rate and regular rhythm.     Pulses: Normal pulses.     Heart sounds: Normal heart sounds.  Pulmonary:     Effort: Pulmonary effort is normal.  Breath sounds: Normal breath sounds.  Musculoskeletal:     Cervical back: No tenderness.  Lymphadenopathy:     Cervical: Cervical adenopathy present.     Left cervical: Superficial cervical adenopathy (midneck lateral) present.  Neurological:     General: No focal deficit present.     Mental Status: She is alert and oriented to person, place, and time.  Psychiatric:        Mood and Affect: Mood normal.        Behavior: Behavior normal.      LABORATORY DATA:  I have reviewed the labs as listed.  CBC Latest Ref Rng & Units 07/26/2019 06/29/2019  WBC 4.0 - 10.5 K/uL 9.0 7.5  Hemoglobin 12.0 - 15.0 g/dL 13.9 14.4  Hematocrit 36.0 - 46.0 % 43.6 45.5  Platelets 150 - 400 K/uL 310 294   CMP Latest Ref Rng & Units 07/26/2019 06/22/2019 06/16/2019  Glucose 70 - 99 mg/dL 105(H) - -  BUN 8 - 23 mg/dL 14 - -  Creatinine 0.44 - 1.00 mg/dL 0.74 - 1.00  Sodium 135 - 145 mmol/L 135 - -  Potassium 3.5 - 5.1 mmol/L 3.8 - -  Chloride 98 - 111 mmol/L 98 - -  CO2 22 - 32 mmol/L 26 - -  Calcium 8.9 - 10.3 mg/dL 9.0 9.5 -  Total Protein 6.5 - 8.1 g/dL 7.4 - -  Total Bilirubin 0.3 - 1.2 mg/dL 0.5 - -  Alkaline Phos 38 - 126 U/L 91 - -  AST 15 - 41 U/L 20 - -  ALT 0 - 44 U/L 21 - -    DIAGNOSTIC IMAGING:  I have independently reviewed the scans and discussed with the patient. NM PET Image Initial (PI) Skull Base To Thigh  Result Date:  07/18/2019 CLINICAL DATA:  Initial treatment strategy for neck cancer. EXAM: NUCLEAR MEDICINE PET SKULL BASE TO THIGH TECHNIQUE: 11.24 mCi F-18 FDG was injected intravenously. Full-ring PET imaging was performed from the skull base to thigh after the radiotracer. CT data was obtained and used for attenuation correction and anatomic localization. Fasting blood glucose: 103 mg/dl COMPARISON:  Neck CT 06/17/2019 FINDINGS: Mediastinal blood pool activity: SUV max 2.83 Liver activity: SUV max NA NECK: LEFT level 2 a lymph node shown to have necrosis on the previous CT examination unchanged based on size, approximately 1 cm (image 29, series 4) (SUVmax = 10.2) Small rounded lymph node (image 23, series 4) 8 mm (SUVmax = 3.5) at LEFT level 2 B Intense palatine tonsillar uptake with asymmetric fullness of tonsillar tissue and asymmetric FDG activity (image 23 and 24, series 4) this is the image where masslike changes in this area may measure as large is 2.2 x 1.7 on the LEFT as compared to the RIGHT. (SUVmax = 16.2) this corresponds to activity along the LEFT palatine tonsil and parapharyngeal region as compared to the RIGHT side which is approximately 7. Small focus of increased activity in the RIGHT neck adjacent to or within a small nodule in the RIGHT hemi thyroid (39, series 4) (SUVmax = 4.0) , Incidental CT findings: none CHEST: No hypermetabolic mediastinal or hilar nodes. No suspicious pulmonary nodules on the CT scan. Incidental CT findings: Calcified atheromatous plaque in the thoracic aorta which is nonaneurysmal. Calcified coronary artery disease. No pericardial effusion. No aortic dilation. ABDOMEN/PELVIS: No abnormal hypermetabolic activity within the liver, pancreas, adrenal glands, or spleen. No hypermetabolic lymph nodes in the abdomen or pelvis. Incidental CT findings: Liver, gallbladder and spleen are unremarkable.  Adrenal glands are normal. Smooth renal contours. No hydronephrosis. No nephrolithiasis. No  sign of acute gastrointestinal process. SKELETON: No focal hypermetabolic activity to suggest skeletal metastasis. Incidental CT findings: Rotary S-shaped scoliotic curvature with degenerative changes with dextroconvexity in the thoracic spine. IMPRESSION: 1. 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 Direct visualization may be helpful. 2. Mildly hypermetabolic area that may be associated with the RIGHT thyroid. Focused ultrasound of this area may be helpful to exclude a small lymph node though no nodal tissue is seen on the previous contrasted neck CT in there is a small nodular area in the thyroid. Biopsy could be considered if there is a focal area in the thyroid. 3. No distant metastatic disease. Electronically Signed   By: Zetta Bills M.D.   On: 07/18/2019 16:26   DG Chest Port 1 View  Result Date: 08/01/2019 CLINICAL DATA:  Port-A-Cath placement. EXAM: PORTABLE CHEST 1 VIEW COMPARISON:  PET-CT 07/18/2019. FINDINGS: PowerPort catheter noted with tip over SVC. Heart size normal. No pulmonary venous congestion. Low lung volumes. No pleural effusion or pneumothorax. Prominent thoracic spine scoliosis concave left. Patient is rotated to the left. IMPRESSION: PowerPort catheter noted in good anatomic position. No pneumothorax. Electronically Signed   By: Marcello Moores  Register   On: 08/01/2019 08:37   DG C-Arm 1-60 Min-No Report  Result Date: 08/01/2019 Fluoroscopy was utilized by the requesting physician.  No radiographic interpretation.     ASSESSMENT:  1.  Squamous cell carcinoma, p16 positive of the left tonsil: - 06/29/2019 Biopsy A. LYMPH NODE, LEFT NECK, NEEDLE CORE BIOPSY:  - Squamous cell carcinoma, p16 positive.  COMMENT:  The carcinoma is positive with p16, p40, cytokeratin 5/6 and p63 consistent with squamous cell carcinoma. The carcinoma is negative with Epstein-Barr virus (EBV), TTF-1 and Napsin-A.  -PET  scan on 07/18/2019 showed left level 2A lymph node, 1 cm.  Small rounded lymph node 8 mm at level 2B on the left side.  Intense palatine tonsillar uptake with asymmetric fullness of tonsillar tissue and symmetrical FDG activity with masslike changes on the left side measuring 2.2 x 1.7 cm compared to the right.  SUV 16.2.  Small focus of increased activity in the right neck adjacent to or within the small nodule in the right hemithyroid. -An ultrasound of the thyroid gland showed heterogeneous thyroid with no masses. -She was evaluated by Dr.Yanagihara.   PLAN:  1.  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 -We reviewed results of the ultrasound of the neck. -We discussed chemotherapy with weekly cisplatin throughout the course of radiation.  We discussed side effects including but not limited to neurotoxicity, nephrotoxicity and ototoxicity, decrease in blood counts, fatigue, rare chance of alopecia among others. -We will reach out to radiation therapy in [place] and coordinate the process. -I will see her back on her first day of treatment.  2.  Hypothyroidism: -Continue Synthroid 50 mcg daily.   Orders placed this encounter:  No orders of the defined types were placed in this encounter.    Derek Jack, MD [facility] [phone]   I, Milinda Antis, am acting as a scribe for Dr. Sanda Linger.  I, Derek Jack MD, have reviewed the above documentation for accuracy and completeness, and I agree with the above.

## 2019-08-07 NOTE — Patient Instructions (Addendum)
[place] at [facility] Discharge Instructions  You were seen today by Dr. Delton Coombes. He went over your recent results. The treatment regimen was covered today, including the side effects and complications. Please remember to maintain your nutrition and drink plenty of water now and once your treatment begins. You will be instructed how to care for your port. Dr. Delton Coombes will see you back on the first day of chemotherapy treatment.   Thank you for choosing [place] at [facility] to provide your oncology and hematology care.  To afford each patient quality time with our provider, please arrive at least 15 minutes before your scheduled appointment time.   If you have a lab appointment with the [facility] please come in thru the Main Entrance and check in at the main information desk  You need to re-schedule your appointment should you arrive 10 or more minutes late.  We strive to give you quality time with our providers, and arriving late affects you and other patients whose appointments are after yours.  Also, if you no show three or more times for appointments you may be dismissed from the clinic at the providers discretion.     Again, thank you for choosing [facility].  Our hope is that these requests will decrease the amount of time that you wait before being seen by our physicians.       _____________________________________________________________  Should you have questions after your visit to [facility], please contact our office at [phone] between the hours of 8:00 a.m. and 4:30 p.m.  Voicemails left after 4:00 p.m. will not be returned until the following business day.  For prescription refill requests, have your pharmacy contact our office and allow 72 hours.    Cancer Center Support Programs:   > Cancer Support Group  2nd Tuesday of the month 1pm-2pm, Journey Room

## 2019-08-09 ENCOUNTER — Encounter (HOSPITAL_COMMUNITY): Payer: Self-pay

## 2019-08-09 DIAGNOSIS — Z95828 Presence of other vascular implants and grafts: Secondary | ICD-10-CM

## 2019-08-09 HISTORY — DX: Presence of other vascular implants and grafts: Z95.828

## 2019-08-09 MED ORDER — PROCHLORPERAZINE MALEATE 10 MG PO TABS: 10.0000 mg | ORAL_TABLET | Freq: Four times a day (QID) | ORAL | 1 refills | Status: DC | PRN

## 2019-08-09 MED ORDER — LIDOCAINE-PRILOCAINE 2.5-2.5 % EX CREA: TOPICAL_CREAM | CUTANEOUS | 3 refills | Status: DC

## 2019-08-09 NOTE — Patient Instructions (Signed)
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  You will be treated weekly in conjunction with radiation therapy with cisplatin.  The intent of treatment is to cure your disease. You will see the doctor regularly throughout treatment.  We will obtain blood work from you prior to every treatment and monitor your results to make sure it is safe to give your treatment. The doctor monitors your response to treatment by the way you are feeling, your blood work, and by obtaining scans periodically.  There will be wait times while you are here for treatment.  It will take about 30 minutes to 1 hour for your lab work to result.  Then there will be wait times while pharmacy mixes your medications.     Medications you will receive in the clinic prior to your chemotherapy medications:  Aloxi:  ALOXI is used in adults to help prevent nausea and vomiting that happens with certain chemotherapy drugs.  Aloxi is a long acting medication, and will remain in your system for about two days.   Emend:  This is an anti-nausea medication that is used with Aloxi to help prevent nausea and vomiting caused by chemotherapy.  Dexamethasone:  This is a steroid given prior to chemotherapy to help prevent allergic reactions; it may also help prevent and control nausea and diarrhea.    CISPLATIN  About This Drug Cisplatin is a drug used to treat cancer. This drug is given in the vein (IV).  This will take 1 hour to infuse.  With this drug you will receive 2 hours of IV hydration prior to administration and 1 hour of IV hydration after administration.  This is to help protect your kidneys.  You will have to urinate 200 mL prior to receiving this medication.  We will give you something to measure your urine in.   Possible Side Effects (More Common) . This drug may affect how your kidneys work. Your kidney function will be checked as needed.  .  Electrolyte changes. Your blood will be checked for electrolyte changes as needed.  . High-frequency hearing loss may occur. You will get IV fluids before and during the Cisplatin infusion to help prevent this. You may also get ringing in the ears.  . Bone marrow depression. This is a decrease in the number of white blood cells, red blood cells, and platelets. This may raise your risk of infection, make you tired and weak (fatigue), and raise your risk of bleeding.  . Nausea and throwing up (vomiting). These symptoms may happen within a few hours after your treatment and may last for a few days to a week. Medicines are available to stop or lessen these side effects.  Possible Side Effects (Less Common) . Effects on the nerves are called peripheral neuropathy. You may feel numbness or pain in your hands and feet. It may be hard for you to button your clothes, open jars, or walk as usual. The effect on the nerves may get worse with more doses of the drug. These effects get better in some people after the drug is stopped, but it does not get better in all people.  Marland Kitchen Blurred vision or other changes in eyesight.  . Soreness of the mouth and throat. You may have red areas, white patches, or sores that hurt.  . Hair loss. You may notice your hair getting thin. Some patients lose their hair. Your hair often grows  back when treatment is done.  Allergic Reactions Allergic reactions to this drug are rare, but may happen in some patients. Signs of allergic reactions to this drug may be a rash, fever, chills, feeling dizzy, trouble breathing, and/or feeling that your heart is beating in a fast or not normal way.  Treating Side Effects . Drink 6-8 cups of fluids each day unless your doctor has told you to limit your fluid intake due to some other health problem. A cup is 8 ounces of fluid. If you throw up or have loose bowel movements you should drink more fluids so that you do not become dehydrated (lack  water in the body due to losing too much fluid).  . If you have numbness and tingling in your hands and feet, be careful when cooking, walking, and handling sharp objects and hot liquids.  . Mouth care is very important. Your mouth care should consist of routine, gentle cleaning of your teeth or dentures and rinsing your mouth with a mixture of 1/2 teaspoon of salt in 8 ounces of water or  teaspoon of baking soda in 8 ounces of water. This should be done at least after each meal and at bedtime.  . If you have mouth sores, avoid mouthwash that has alcohol. Also avoid alcohol and smoking because they can bother your mouth and throat.  . Talk with your nurse about getting a wig before you lose your hair. Also, call the [facility] at 800-ACS-2345 to find out information about the "Look Good, Feel Better" program close to where you live. It is a free program where women getting chemotherapy can learn about wigs, turbans and scarves as well as makeup techniques and skin and nail care.  Food and Drug Interactions  There are no known interactions of Cisplatin with food. This drug may interact with other medicines. Tell your doctor and pharmacist about all the medicines and dietary supplements (vitamins, minerals, herbs and others) that you are taking at this time. The safety and use of dietary supplements and alternative diets are often not known. Using these might affect your cancer or interfere with your treatment. Until more is known, you should not use dietary supplements or alternative diets without your cancer doctor's help.  When to Call the Doctor  Call your doctor or nurse right away if you have any of these symptoms:  . Rash or itching  . Feeling dizzy or lightheaded  . Wheezing or trouble breathing  . Swelling of the face  . Fever of 100.4 F (38 C) or above  . Chills  . Easy bleeding or bruising  . Decreased urine  . Weight gain of 5 pounds in one week (fluid  retention)  . Nausea that stops you from eating or drinking  . Throwing up  Call your doctor or nurse as soon as possible if you have these symptoms:  . Numbness, tingling, decreased feeling or weakness in fingers, toes, arms, or legs  . Trouble walking or changes in the way you walk, feeling clumsy when buttoning clothes, opening jars, or other routine hand motions  . Blurred vision or other changes in eyesight  . Changes in hearing, ringing in the ears  . Pain in your mouth or throat that makes it hard to eat or drink  . Fatigue that interferes with your daily activities  Sexual Problems and Reproductive Concerns  . Infertility warning: Sexual problems and reproduction concerns may occur. In both men and women, this drug may  affect your ability to have children. This cannot be determined before your treatment. Speak with your doctor or nurse if you plan to have children. Ask for information on sperm or egg banking.  . In men, this drug may interfere with your ability to make sperm, but it should not change your ability to have sexual relations.  . In women, menstrual bleeding may become irregular or stop while you are receiving this drug. Do not assume that you cannot become pregnant if you do not have a menstrual period.  . Women may experience signs of menopause like vaginal dryness, itching, and pain during sexual relations  . Genetic counseling is available for you to talk about the effects of this drug therapy on future pregnancies. Also, a genetic counselor can look at the possible risk of problems in the unborn baby due to this medicine if an exposure happens during pregnancy.   SELF CARE ACTIVITIES WHILE RECEIVING CHEMOTHERAPY:  Hydration Increase your fluid intake 48 hours prior to treatment and drink at least 8 to 12 cups (64 ounces) of water/decaffeinated beverages per day after treatment. You can still have your cup of coffee or soda but these beverages do not count  as part of your 8 to 12 cups that you need to drink daily. No alcohol intake.  Medications Continue taking your normal prescription medication as prescribed.  If you start any new herbal or new supplements please let us know first to make sure it is safe.  Mouth Care Have teeth cleaned professionally before starting treatment. Keep dentures and partial plates clean. Use soft toothbrush and do not use mouthwashes that contain alcohol. Biotene is a good mouthwash that is available at most pharmacies or may be ordered by calling [phone]. Use warm salt water gargles (1 teaspoon salt per 1 quart warm water) before and after meals and at bedtime. If you need dental work, please let the doctor know before you go for your appointment so that we can coordinate the best possible time for you in regards to your chemo regimen. You need to also let your dentist know that you are actively taking chemo. We may need to do labs prior to your dental appointment.  Skin Care Always use sunscreen that has not expired and with SPF (Sun Protection Factor) of 50 or higher. Wear hats to protect your head from the sun. Remember to use sunscreen on your hands, ears, face, & feet.  Use good moisturizing lotions such as udder cream, eucerin, or even Vaseline. Some chemotherapies can cause dry skin, color changes in your skin and nails.    . Avoid long, hot showers or baths. . Use gentle, fragrance-free soaps and laundry detergent. . Use moisturizers, preferably creams or ointments rather than lotions because the thicker consistency is better at preventing skin dehydration. Apply the cream or ointment within 15 minutes of showering. Reapply moisturizer at night, and moisturize your hands every time after you wash them.  Hair Loss (if your doctor says your hair will fall out)  . If your doctor says that your hair is likely to fall out, decide before you begin chemo whether you want to wear a wig. You may want to shop  before treatment to match your hair color. . Hats, turbans, and scarves can also camouflage hair loss, although some people prefer to leave their heads uncovered. If you go bare-headed outdoors, be sure to use sunscreen on your scalp. . Cut your hair short. It eases the inconvenience of  shedding lots of hair, but it also can reduce the emotional impact of watching your hair fall out. . Don't perm or color your hair during chemotherapy. Those chemical treatments are already damaging to hair and can enhance hair loss. Once your chemo treatments are done and your hair has grown back, it's OK to resume dyeing or perming hair.  With chemotherapy, hair loss is almost always temporary. But when it grows back, it may be a different color or texture. In older adults who still had hair color before chemotherapy, the new growth may be completely gray.  Often, new hair is very fine and soft.  Infection Prevention Please wash your hands for at least 30 seconds using warm soapy water. Handwashing is the #1 way to prevent the spread of germs. Stay away from sick people or people who are getting over a cold. If you develop respiratory systems such as green/yellow mucus production or productive cough or persistent cough let us know and we will see if you need an antibiotic. It is a good idea to keep a pair of gloves on when going into grocery stores/[employer] to decrease your risk of coming into contact with germs on the carts, etc. Carry alcohol hand gel with you at all times and use it frequently if out in public. If your temperature reaches 100.4 or higher please call the clinic and let us know.  If it is after hours or on the weekend please go to the ER if your temperature is over 100.4.  Please have your own personal thermometer at home to use.    Sex and bodily fluids If you are going to have sex, a condom must be used to protect the person that isn't taking chemotherapy. Chemo can decrease your libido (sex drive).  For a few days after chemotherapy, chemotherapy can be excreted through your bodily fluids.  When using the toilet please close the lid and flush the toilet twice.  Do this for a few day after you have had chemotherapy.   Effects of chemotherapy on your sex life Some changes are simple and won't last long. They won't affect your sex life permanently.  Sometimes you may feel: . too tired . not strong enough to be very active . sick or sore  . not in the mood . anxious or low  Your anxiety might not seem related to sex. For example, you may be worried about the cancer and how your treatment is going. Or you may be worried about money, or about how you family are coping with your illness.  These things can cause stress, which can affect your interest in sex. It's important to talk to your partner about how you feel.  Remember - the changes to your sex life don't usually last long. There's usually no medical reason to stop having sex during chemo. The drugs won't have any long term physical effects on your performance or enjoyment of sex. Cancer can't be passed on to your partner during sex  Contraception It's important to use reliable contraception during treatment. Avoid getting pregnant while you or your partner are having chemotherapy. This is because the drugs may harm the baby. Sometimes chemotherapy drugs can leave a man or woman infertile.  This means you would not be able to have children in the future. You might want to talk to someone about permanent infertility. It can be very difficult to learn that you may no longer be able to have children. Some people find counselling  helpful. There might be ways to preserve your fertility, although this is easier for men than for women. You may want to speak to a fertility expert. You can talk about sperm banking or harvesting your eggs. You can also ask about other fertility options, such as donor eggs. If you have or have had breast cancer, your doctor  might advise you not to take the contraceptive pill. This is because the hormones in it might affect the cancer. It is not known for sure whether or not chemotherapy drugs can be passed on through semen or secretions from the vagina. Because of this some doctors advise people to use a barrier method if you have sex during treatment. This applies to vaginal, anal or oral sex. Generally, doctors advise a barrier method only for the time you are actually having the treatment and for about a week after your treatment. Advice like this can be worrying, but this does not mean that you have to avoid being intimate with your partner. You can still have close contact with your partner and continue to enjoy sex.  Animals If you have cats or birds we just ask that you not change the litter or change the cage.  Please have someone else do this for you while you are on chemotherapy.   Food Safety During and After Cancer Treatment Food safety is important for people both during and after cancer treatment. Cancer and cancer treatments, such as chemotherapy, radiation therapy, and stem cell/bone marrow transplantation, often weaken the immune system. This makes it harder for your body to protect itself from foodborne illness, also called food poisoning. Foodborne illness is caused by eating food that contains harmful bacteria, parasites, or viruses.  Foods to avoid Some foods have a higher risk of becoming tainted with bacteria. These include: Marland Kitchen Unwashed fresh fruit and vegetables, especially leafy vegetables that can hide dirt and other contaminants . Raw sprouts, such as alfalfa sprouts . Raw or undercooked beef, especially ground beef, or other raw or undercooked meat and poultry . Fatty, fried, or spicy foods immediately before or after treatment.  These can sit heavy on your stomach and make you feel nauseous. . Raw or undercooked shellfish, such as oysters. . Sushi and sashimi, which often contain raw fish.   . Unpasteurized beverages, such as unpasteurized fruit juices, raw milk, raw yogurt, or cider . Undercooked eggs, such as soft boiled, over easy, and poached; raw, unpasteurized eggs; or foods made with raw egg, such as homemade raw cookie dough and homemade mayonnaise  Simple steps for food safety  Shop smart. . Do not buy food stored or displayed in an unclean area. . Do not buy bruised or damaged fruits or vegetables. . Do not buy cans that have cracks, dents, or bulges. . Pick up foods that can spoil at the end of your shopping trip and store them in a cooler on the way home.  Prepare and clean up foods carefully. . Rinse all fresh fruits and vegetables under running water, and dry them with a clean towel or paper towel. . Clean the top of cans before opening them. . After preparing food, wash your hands for 20 seconds with hot water and soap. Pay special attention to areas between fingers and under nails. . Clean your utensils and dishes with hot water and soap. Marland Kitchen Disinfect your kitchen and cutting boards using 1 teaspoon of liquid, unscented bleach mixed into 1 quart of water.    Dispose of old food. Marland Kitchen  Eat canned and packaged food before its expiration date (the "use by" or "best before" date). . Consume refrigerated leftovers within 3 to 4 days. After that time, throw out the food. Even if the food does not smell or look spoiled, it still may be unsafe. Some bacteria, such as Listeria, can grow even on foods stored in the refrigerator if they are kept for too long.  Take precautions when eating out. . At restaurants, avoid buffets and salad bars where food sits out for a long time and comes in contact with many people. Food can become contaminated when someone with a virus, often a norovirus, or another "bug" handles it. . Put any leftover food in a "to-go" container yourself, rather than having the server do it. And, refrigerate leftovers as soon as you get home. . Choose  restaurants that are clean and that are willing to prepare your food as you order it cooked.   AT HOME MEDICATIONS:                                                                                                                                                                Compazine/Prochlorperazine 10mg  tablet. Take 1 tablet every 6 hours as needed for nausea/vomiting. (This can make you sleepy)   EMLA cream. Apply a quarter size amount to port site 1 hour prior to chemo. Do not rub in. Cover with plastic wrap.    Diarrhea Sheet   If you are having loose stools/diarrhea, please purchase Imodium and begin taking as outlined:  At the first sign of poorly formed or loose stools you should begin taking Imodium (loperamide) 2 mg capsules.  Take two tablets (4mg ) followed by one tablet (2mg ) every 2 hours - DO NOT EXCEED 8 tablets in 24 hours.  If it is bedtime and you are having loose stools, take 2 tablets at bedtime, then 2 tablets every 4 hours until morning.   Always call the [facility] if you are having loose stools/diarrhea that you can't get under control.  Loose stools/diarrhea leads to dehydration (loss of water) in your body.  We have other options of trying to get the loose stools/diarrhea to stop but you must let us know!   Constipation Sheet  Colace - 100 mg capsules - take 2 capsules daily.  If this doesn't help then you can increase to 2 capsules twice daily.  Please call if the above does not work for you. Do not go more than 2 days without a bowel movement.  It is very important that you do not become constipated.  It will make you feel sick to your stomach (nausea) and can cause abdominal pain and vomiting.  Nausea Sheet   Compazine/Prochlorperazine 10mg  tablet. Take 1 tablet every 6 hours as  needed for nausea/vomiting (This can make you drowsy).  If you are having persistent nausea (nausea that does not stop) please call the [facility] and let us know the amount of  nausea that you are experiencing.  If you begin to vomit, you need to call the [facility] and if it is the weekend and you have vomited more than one time and can't get it to stop-go to the Emergency Room.  Persistent nausea/vomiting can lead to dehydration (loss of fluid in your body) and will make you feel very weak and unwell. Ice chips, sips of clear liquids, foods that are at room temperature, crackers, and toast tend to be better tolerated.   SYMPTOMS TO REPORT AS SOON AS POSSIBLE AFTER TREATMENT:  FEVER GREATER THAN 100.4 F  CHILLS WITH OR WITHOUT FEVER  NAUSEA AND VOMITING THAT IS NOT CONTROLLED WITH YOUR NAUSEA MEDICATION  UNUSUAL SHORTNESS OF BREATH  UNUSUAL BRUISING OR BLEEDING  TENDERNESS IN MOUTH AND THROAT WITH OR WITHOUT PRESENCE OF ULCERS  URINARY PROBLEMS  BOWEL PROBLEMS  UNUSUAL RASH      Wear comfortable clothing and clothing appropriate for easy access to any Portacath or PICC line. Let us know if there is anything that we can do to make your therapy better!    What to do if you need assistance after hours or on the weekends: CALL [phone].  HOLD on the line, do not hang up.  You will hear multiple messages but at the end you will be connected with a nurse triage line.  They will contact the doctor if necessary.  Most of the time they will be able to assist you.  Do not call the hospital operator.      I have been informed and understand all of the instructions given to me and have received a copy. I have been instructed to call the clinic [phone] or my family physician as soon as possible for continued medical care, if indicated. I do not have any more questions at this time but understand that I may call the [facility] or the Patient Navigator at [phone] during office hours should I have questions or need assistance in obtaining follow-up care.

## 2019-08-10 ENCOUNTER — Other Ambulatory Visit: Payer: Self-pay

## 2019-08-10 ENCOUNTER — Inpatient Hospital Stay (HOSPITAL_COMMUNITY): Payer: PPO

## 2019-08-10 DIAGNOSIS — C090 Malignant neoplasm of tonsillar fossa: Secondary | ICD-10-CM | POA: Diagnosis not present

## 2019-08-10 DIAGNOSIS — C099 Malignant neoplasm of tonsil, unspecified: Secondary | ICD-10-CM

## 2019-08-10 DIAGNOSIS — Z95828 Presence of other vascular implants and grafts: Secondary | ICD-10-CM

## 2019-08-10 NOTE — Progress Notes (Signed)

## 2019-08-15 NOTE — Progress Notes (Signed)
.   Pharmacist Chemotherapy Monitoring - Initial Assessment    Anticipated start date: 08/17/19   Regimen:  . Are orders appropriate based on the patient's diagnosis, regimen, and cycle? Yes . Does the plan date match the patient's scheduled date? Yes . Is the sequencing of drugs appropriate? Yes . Are the premedications appropriate for the patient's regimen? Yes . Prior Authorization for treatment is: Approved o If applicable, is the correct biosimilar selected based on the patient's insurance? not applicable  Organ Function and Labs: Marland Kitchen Are dose adjustments needed based on the patient's renal function, hepatic function, or hematologic function? No . Are appropriate labs ordered prior to the start of patient's treatment? Yes . Other organ system assessment, if indicated: N/A . The following baseline labs, if indicated, have been ordered: N/A  Dose Assessment: . Are the drug doses appropriate? Yes . Are the following correct: o Drug concentrations Yes o IV fluid compatible with drug Yes o Administration routes Yes o Timing of therapy Yes . If applicable, does the patient have documented access for treatment and/or plans for port-a-cath placement? yes . If applicable, have lifetime cumulative doses been properly documented and assessed? not applicable Lifetime Dose Tracking  No doses have been documented on this patient for the following tracked chemicals: Doxorubicin, Epirubicin, Idarubicin, Daunorubicin, Mitoxantrone, Bleomycin, Oxaliplatin, Carboplatin, Liposomal Doxorubicin  o   Toxicity Monitoring/Prevention: . The patient has the following take home antiemetics prescribed: Prochlorperazine . The patient has the following take home medications prescribed: N/A . Medication allergies and previous infusion related reactions, if applicable, have been reviewed and addressed. Yes . The patient's current medication list has been assessed for drug-drug interactions with their  chemotherapy regimen. no significant drug-drug interactions were identified on review.  Order Review: . Are the treatment plan orders signed? No . Is the patient scheduled to see a provider prior to their treatment? Yes  I verify that I have reviewed each item in the above checklist and answered each question accordingly.  Wynona Neat 08/15/2019 4:00 PM

## 2019-08-16 ENCOUNTER — Inpatient Hospital Stay (HOSPITAL_COMMUNITY): Payer: PPO

## 2019-08-16 ENCOUNTER — Inpatient Hospital Stay (HOSPITAL_BASED_OUTPATIENT_CLINIC_OR_DEPARTMENT_OTHER): Payer: PPO | Admitting: Hematology

## 2019-08-16 ENCOUNTER — Other Ambulatory Visit: Payer: Self-pay

## 2019-08-16 VITALS — BP 172/75 | HR 79 | Temp 97.5°F | Resp 18

## 2019-08-16 VITALS — BP 183/72 | HR 73 | Temp 98.1°F | Resp 20 | Wt 216.7 lb

## 2019-08-16 DIAGNOSIS — C099 Malignant neoplasm of tonsil, unspecified: Secondary | ICD-10-CM

## 2019-08-16 DIAGNOSIS — Z5111 Encounter for antineoplastic chemotherapy: Secondary | ICD-10-CM | POA: Diagnosis not present

## 2019-08-16 DIAGNOSIS — Z95828 Presence of other vascular implants and grafts: Secondary | ICD-10-CM

## 2019-08-16 DIAGNOSIS — C090 Malignant neoplasm of tonsillar fossa: Secondary | ICD-10-CM | POA: Diagnosis not present

## 2019-08-16 LAB — COMPREHENSIVE METABOLIC PANEL
ALT: 18 U/L (ref 0–44)
AST: 18 U/L (ref 15–41)
Albumin: 3.9 g/dL (ref 3.5–5.0)
Alkaline Phosphatase: 86 U/L (ref 38–126)
Anion gap: 10 (ref 5–15)
BUN: 13 mg/dL (ref 8–23)
CO2: 27 mmol/L (ref 22–32)
Calcium: 8.8 mg/dL — ABNORMAL LOW (ref 8.9–10.3)
Chloride: 102 mmol/L (ref 98–111)
Creatinine, Ser: 0.79 mg/dL (ref 0.44–1.00)
GFR calc Af Amer: >60 mL/min (ref >60)
GFR calc non Af Amer: >60 mL/min (ref >60)
Glucose, Bld: 114 mg/dL — ABNORMAL HIGH (ref 70–99)
Potassium: 4 mmol/L (ref 3.5–5.1)
Sodium: 139 mmol/L (ref 135–145)
Total Bilirubin: 0.8 mg/dL (ref 0.3–1.2)
Total Protein: 6.9 g/dL (ref 6.5–8.1)

## 2019-08-16 LAB — CBC WITH DIFFERENTIAL/PLATELET
Abs Immature Granulocytes: 0.03 10*3/uL (ref 0.00–0.07)
Basophils Absolute: 0 10*3/uL (ref 0.0–0.1)
Basophils Relative: 1 %
Eosinophils Absolute: 0.1 10*3/uL (ref 0.0–0.5)
Eosinophils Relative: 2 %
HCT: 41.4 % (ref 36.0–46.0)
Hemoglobin: 13.4 g/dL (ref 12.0–15.0)
Immature Granulocytes: 1 %
Lymphocytes Relative: 28 %
Lymphs Abs: 1.7 10*3/uL (ref 0.7–4.0)
MCH: 29.8 pg (ref 26.0–34.0)
MCHC: 32.4 g/dL (ref 30.0–36.0)
MCV: 92.2 fL (ref 80.0–100.0)
Monocytes Absolute: 0.5 10*3/uL (ref 0.1–1.0)
Monocytes Relative: 8 %
Neutro Abs: 3.8 10*3/uL (ref 1.7–7.7)
Neutrophils Relative %: 60 %
Platelets: 278 10*3/uL (ref 150–400)
RBC: 4.49 MIL/uL (ref 3.87–5.11)
RDW: 13.2 % (ref 11.5–15.5)
WBC: 6.2 10*3/uL (ref 4.0–10.5)
nRBC: 0 % (ref 0.0–0.2)

## 2019-08-16 LAB — MAGNESIUM: Magnesium: 2.1 mg/dL (ref 1.7–2.4)

## 2019-08-16 MED ORDER — HEPARIN SOD (PORK) LOCK FLUSH 100 UNIT/ML IV SOLN
500.0000 [IU] | Freq: Once | INTRAVENOUS | Status: AC | PRN
  Administered 2019-08-16: 500 [IU]

## 2019-08-16 MED ORDER — POTASSIUM CHLORIDE 2 MEQ/ML IV SOLN
Freq: Once | INTRAVENOUS | Status: AC
  Filled 2019-08-16: qty 10

## 2019-08-16 MED ORDER — SODIUM CHLORIDE 0.9 % IV SOLN
40.0000 mg/m2 | Freq: Once | INTRAVENOUS | Status: AC
  Administered 2019-08-16: 84 mg via INTRAVENOUS
  Filled 2019-08-16: qty 84

## 2019-08-16 MED ORDER — SODIUM CHLORIDE 0.9 % IV SOLN: Freq: Once | INTRAVENOUS | Status: AC

## 2019-08-16 MED ORDER — PALONOSETRON HCL INJECTION 0.25 MG/5ML
INTRAVENOUS | Status: AC
  Filled 2019-08-16: qty 5

## 2019-08-16 MED ORDER — PALONOSETRON HCL INJECTION 0.25 MG/5ML
0.2500 mg | Freq: Once | INTRAVENOUS | Status: AC
  Administered 2019-08-16: 0.25 mg via INTRAVENOUS

## 2019-08-16 MED ORDER — SODIUM CHLORIDE 0.9 % IV SOLN
150.0000 mg | Freq: Once | INTRAVENOUS | Status: AC
  Administered 2019-08-16: 150 mg via INTRAVENOUS
  Filled 2019-08-16: qty 150

## 2019-08-16 MED ORDER — SODIUM CHLORIDE 0.9 % IV SOLN
10.0000 mg | Freq: Once | INTRAVENOUS | Status: AC
  Administered 2019-08-16: 10 mg via INTRAVENOUS
  Filled 2019-08-16: qty 10

## 2019-08-16 MED ORDER — SODIUM CHLORIDE 0.9% FLUSH
10.0000 mL | INTRAVENOUS | Status: DC | PRN
  Administered 2019-08-16: 10 mL

## 2019-08-16 NOTE — Progress Notes (Signed)
Patient has been assessed, vital signs and labs have been reviewed by Dr. Katragadda. ANC, Creatinine, LFTs, and Platelets are within treatment parameters per Dr. Katragadda. The patient is good to proceed with treatment at this time.  

## 2019-08-16 NOTE — Patient Instructions (Addendum)
[place] at [facility] Discharge Instructions  You were seen today by Dr. Delton Coombes. He went over your recent results. Start taking Compazine every day for nausea. Drink at least 3 liters of fluids daily. Continue monitoring for any symptoms from the chemotherapy, such as sores and numbness. You will be seen by the PA in 1 week for labs and follow up.   Thank you for choosing [place] at [facility] to provide your oncology and hematology care.  To afford each patient quality time with our provider, please arrive at least 15 minutes before your scheduled appointment time.   If you have a lab appointment with the [facility] please come in thru the Main Entrance and check in at the main information desk  You need to re-schedule your appointment should you arrive 10 or more minutes late.  We strive to give you quality time with our providers, and arriving late affects you and other patients whose appointments are after yours.  Also, if you no show three or more times for appointments you may be dismissed from the clinic at the providers discretion.     Again, thank you for choosing [facility].  Our hope is that these requests will decrease the amount of time that you wait before being seen by our physicians.       _____________________________________________________________  Should you have questions after your visit to [facility], please contact our office at [phone] between the hours of 8:00 a.m. and 4:30 p.m.  Voicemails left after 4:00 p.m. will not be returned until the following business day.  For prescription refill requests, have your pharmacy contact our office and allow 72 hours.    Cancer Center Support Programs:   > Cancer Support Group  2nd Tuesday of the month 1pm-2pm, Journey Room

## 2019-08-16 NOTE — Progress Notes (Addendum)
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9 % 145 mL IVPB, 150 mg, Intravenous,  Once, 0 of 7 cycles  for chemotherapy treatment.      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 female, returns for routine follow-up and initiation of chemotherapy. Kristi Weiss was last seen on 08/07/2019.  Due for cycle #1 of cisplatin and Aloxi today.   Overall, she tells me she has been feeling pretty well. She reports that she has hearing loss in her right ear from exposure to loud noises, but denies having numbness/tingling. Her appetite is good.  Overall, she feels ready for initial cycle of chemo today.    REVIEW OF SYSTEMS:  Review of Systems  Constitutional: Negative for appetite change and  fatigue.  HENT:   Positive for hearing loss (R ear).   Neurological: Positive for headaches. Negative for numbness.  Psychiatric/Behavioral: Positive for sleep disturbance. The patient is nervous/anxious.   All other systems reviewed and are negative.   PAST MEDICAL/SURGICAL HISTORY:  Past Medical History:  Diagnosis Date  . Arthritis   . GERD (gastroesophageal reflux disease)   . Hypothyroidism   . 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 Port-A-Cath in place 08/09/2019  . 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 Scoliosis   . Sinusitis   . 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   Past Surgical History:  Procedure Laterality Date  . NO PAST SURGERIES    . No prior surgery    . PORTACATH PLACEMENT Right 08/01/2019   Procedure: INSERTION PORT-A-CATH;  Surgeon: Aviva Signs, MD;  Location: [facility];  Service: General;  Laterality: Right;    SOCIAL HISTORY:  Social History   Socioeconomic History  . Marital status: Widowed    Spouse name: Not on file  . Number of children: 1  . Years of education: Not on file  . Highest education level: Not on file  Occupational History  . Occupation: RETIRED  Tobacco Use  . Smoking status: Former Research scientist (life sciences)  . Smokeless tobacco: Never Used  . Tobacco comment: some in her 49s and 30s  Vaping Use  .  Vaping Use: Never used  Substance and Sexual Activity  . Alcohol use: Yes    Comment: on rare occasion "like twice a year"  . Drug use: Never  . Sexual activity: Not Currently  Other Topics Concern  . Not on file  Social History Narrative   Lives at home alone   Retired   Widow   Caffeine: about 4 cups coffee, 3 glasses of tea daily   Social Determinants of Health   Financial Resource Strain: Low Risk   . Difficulty of Paying Living Expenses: Not hard at all    Food Insecurity: No Food Insecurity  . Worried About Charity fundraiser in the Last Year: Never true  . Ran Out of Food in the Last Year: Never true  Transportation Needs: No Transportation Needs  . Lack of Transportation (Medical): No  . Lack of Transportation (Non-Medical): No  Physical Activity: Sufficiently Active  . Days of Exercise per Week: 7 days  . Minutes of Exercise per Session: 30 min  Stress: Stress Concern Present  . Feeling of Stress : Very much  Social Connections:   . Frequency of Communication with Friends and Family:   . Frequency of Social Gatherings with Friends and Family:   . Attends Religious Services:   . Active Member of Clubs or Organizations:   . Attends Archivist Meetings:   Marland Kitchen Marital Status:   Intimate Partner Violence: Not At Risk  . Fear of Current or Ex-Partner: No  . Emotionally Abused: No  . Physically Abused: No  . Sexually Abused: No    FAMILY HISTORY:  Family History  Problem Relation Age of Onset  . Lung cancer Mother        smoker  . Cancer Mother        Throat  . Lung cancer Father        smoker  . Diabetes Father   . CVA Brother   . Diabetes Brother   . Diabetes Maternal Grandmother   . Heart attack Paternal Grandfather   . Migraines Neg Hx   . Headache Neg Hx     CURRENT MEDICATIONS:  Current Outpatient Medications  Medication Sig Dispense Refill  . CISplatin in sodium chloride 0.9 % 250 mL Inject 40 mg/m2 into the vein once a week.    . diphenhydrAMINE (BENADRYL) 25 mg capsule Take 25 mg by mouth at bedtime as needed.    Marland Kitchen esomeprazole (NEXIUM) 40 MG capsule Take 40 mg by mouth daily.     Marland Kitchen ibuprofen (ADVIL) 200 MG tablet Take 400-600 mg by mouth every 8 (eight) hours as needed for moderate pain.     Marland Kitchen levothyroxine (SYNTHROID) 50 MCG tablet Take 50 mcg by mouth daily. BRAND    . lidocaine-prilocaine (EMLA) cream Apply a small amount to port a cath site and cover with plastic wrap 1 hour prior to chemotherapy  appointments 30 g 3  . PREVIDENT 5000 BOOSTER PLUS 1.1 % PSTE APPLY WITH TOOTHBRUSH AT BEDTIME    . prochlorperazine (COMPAZINE) 10 MG tablet Take 1 tablet (10 mg total) by mouth every 6 (six) hours as needed (Nausea or vomiting). 30 tablet 1   No current facility-administered medications for this visit.    ALLERGIES:  Allergies  Allergen Reactions  . Sulfamethoxazole-Trimethoprim Other (See Comments)    Headaches  . Penicillins Rash    20 years    PHYSICAL EXAM:  Performance status (ECOG): 1 - Symptomatic but completely ambulatory  There were no  vitals filed for this visit. Wt Readings from Last 3 Encounters:  08/07/19 215 lb 1.6 oz (97.6 kg)  07/27/19 213 lb (96.6 kg)  07/26/19 214 lb 6.4 oz (97.3 kg)   Physical Exam Vitals reviewed.  Constitutional:      Appearance: Normal appearance. She is obese.  Cardiovascular:     Rate and Rhythm: Normal rate and regular rhythm.     Pulses: Normal pulses.     Heart sounds: Normal heart sounds.  Pulmonary:     Effort: Pulmonary effort is normal.     Breath sounds: Normal breath sounds.  Musculoskeletal:     Right lower leg: No edema.     Left lower leg: No edema.  Neurological:     General: No focal deficit present.     Mental Status: She is alert and oriented to person, place, and time.  Psychiatric:        Mood and Affect: Mood normal.        Behavior: Behavior normal.     LABORATORY DATA:  I have reviewed the labs as listed.  CBC Latest Ref Rng & Units 07/26/2019 06/29/2019  WBC 4.0 - 10.5 K/uL 9.0 7.5  Hemoglobin 12.0 - 15.0 g/dL 13.9 14.4  Hematocrit 36 - 46 % 43.6 45.5  Platelets 150 - 400 K/uL 310 294   CMP Latest Ref Rng & Units 07/26/2019 06/22/2019 06/16/2019  Glucose 70 - 99 mg/dL 105(H) - -  BUN 8 - 23 mg/dL 14 - -  Creatinine 0.44 - 1.00 mg/dL 0.74 - 1.00  Sodium 135 - 145 mmol/L 135 - -  Potassium 3.5 - 5.1 mmol/L 3.8 - -  Chloride 98 - 111 mmol/L 98 - -  CO2 22 - 32 mmol/L 26 - -  Calcium 8.9 - 10.3  mg/dL 9.0 9.5 -  Total Protein 6.5 - 8.1 g/dL 7.4 - -  Total Bilirubin 0.3 - 1.2 mg/dL 0.5 - -  Alkaline Phos 38 - 126 U/L 91 - -  AST 15 - 41 U/L 20 - -  ALT 0 - 44 U/L 21 - -    DIAGNOSTIC IMAGING:  I have independently reviewed the scans and discussed with the patient. NM PET Image Initial (PI) Skull Base To Thigh  Result Date: 07/18/2019 CLINICAL DATA:  Initial treatment strategy for neck cancer. EXAM: NUCLEAR MEDICINE PET SKULL BASE TO THIGH TECHNIQUE: 11.24 mCi F-18 FDG was injected intravenously. Full-ring PET imaging was performed from the skull base to thigh after the radiotracer. CT data was obtained and used for attenuation correction and anatomic localization. Fasting blood glucose: 103 mg/dl COMPARISON:  Neck CT 06/17/2019 FINDINGS: Mediastinal blood pool activity: SUV max 2.83 Liver activity: SUV max NA NECK: LEFT level 2 a lymph node shown to have necrosis on the previous CT examination unchanged based on size, approximately 1 cm (image 29, series 4) (SUVmax = 10.2) Small rounded lymph node (image 23, series 4) 8 mm (SUVmax = 3.5) at LEFT level 2 B Intense palatine tonsillar uptake with asymmetric fullness of tonsillar tissue and asymmetric FDG activity (image 23 and 24, series 4) this is the image where masslike changes in this area may measure as large is 2.2 x 1.7 on the LEFT as compared to the RIGHT. (SUVmax = 16.2) this corresponds to activity along the LEFT palatine tonsil and parapharyngeal region as compared to the RIGHT side which is approximately 7. Small focus of increased activity in the RIGHT neck adjacent to or within a small nodule in  the RIGHT hemi thyroid (39, series 4) (SUVmax = 4.0) , Incidental CT findings: none CHEST: No hypermetabolic mediastinal or hilar nodes. No suspicious pulmonary nodules on the CT scan. Incidental CT findings: Calcified atheromatous plaque in the thoracic aorta which is nonaneurysmal. Calcified coronary artery disease. No pericardial effusion.  No aortic dilation. ABDOMEN/PELVIS: No abnormal hypermetabolic activity within the liver, pancreas, adrenal glands, or spleen. No hypermetabolic lymph nodes in the abdomen or pelvis. Incidental CT findings: Liver, gallbladder and spleen are unremarkable. Adrenal glands are normal. Smooth renal contours. No hydronephrosis. No nephrolithiasis. No sign of acute gastrointestinal process. SKELETON: No focal hypermetabolic activity to suggest skeletal metastasis. Incidental CT findings: Rotary S-shaped scoliotic curvature with degenerative changes with dextroconvexity in the thoracic spine. IMPRESSION: 1. 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 Direct visualization may be helpful. 2. Mildly hypermetabolic area that may be associated with the RIGHT thyroid. Focused ultrasound of this area may be helpful to exclude a small lymph node though no nodal tissue is seen on the previous contrasted neck CT in there is a small nodular area in the thyroid. Biopsy could be considered if there is a focal area in the thyroid. 3. No distant metastatic disease. Electronically Signed   By: Zetta Bills M.D.   On: 07/18/2019 16:26   DG Chest Port 1 View  Result Date: 08/01/2019 CLINICAL DATA:  Port-A-Cath placement. EXAM: PORTABLE CHEST 1 VIEW COMPARISON:  PET-CT 07/18/2019. FINDINGS: PowerPort catheter noted with tip over SVC. Heart size normal. No pulmonary venous congestion. Low lung volumes. No pleural effusion or pneumothorax. Prominent thoracic spine scoliosis concave left. Patient is rotated to the left. IMPRESSION: PowerPort catheter noted in good anatomic position. No pneumothorax. Electronically Signed   By: Marcello Moores  Register   On: 08/01/2019 08:37   DG C-Arm 1-60 Min-No Report  Result Date: 08/01/2019 Fluoroscopy was utilized by the requesting physician.  No radiographic interpretation.     ASSESSMENT:  1. Squamous cell carcinoma, p16  positiveof the left tonsil: - 06/29/2019 Biopsy A. LYMPH NODE, LEFT NECK, NEEDLE CORE BIOPSY:  - Squamous cell carcinoma, p16 positive.  COMMENT:  The carcinoma is positive with p16, p40, cytokeratin 5/6 and p63 consistent with squamous cell carcinoma. The carcinoma is negative with Epstein-Barr virus (EBV), TTF-1 and Napsin-A. -PET scan on 07/18/2019 showed left level 2A lymph node, 1 cm.  Small rounded lymph node 8 mm at level 2B on the left side.  Intense palatine tonsillar uptake with asymmetric fullness of tonsillar tissue and symmetrical FDG activity with masslike changes on the left side measuring 2.2 x 1.7 cm compared to the right.  SUV 16.2.  Small focus of increased activity in the right neck adjacent to or within the small nodule in the right hemithyroid. -An ultrasound of the thyroid gland showed heterogeneous thyroid with no masses. -She was evaluated by Dr.Yanagihara. -XRT with weekly cisplatin started on 08/16/2019.   PLAN:  1. 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  She felt little anxious during radiation. -I have reviewed her labs.  Creatinine is 0.79.  CBC was normal.  Electrolytes were normal. -She will proceed with her first weekly dose of cisplatin with pre and post hydration. -We reviewed side effects of cisplatin in detail.  She was encouraged to drink 2 to 3 L of water daily.  Has a slight baseline hearing loss on the left side with ringing  in the ear.  We will keep a close eye on it. -She will call us on Friday if she needs fluids.  Otherwise we will see her back in 1 week for follow-up.  2.  Hypothyroidism: -Continue Synthroid 50 mcg daily.  3.  Anxiety: -She will call us if anxiety gets worse.  Will prescribe Xanax.   Orders placed this encounter:  No orders of the defined types were placed in this encounter.    Kristi Jack, MD [place] [phone]   I, Milinda Antis, am acting as a scribe for Dr. Sanda Linger.  I, Kristi Jack MD, have reviewed the above documentation for accuracy and completeness, and I agree with the above.

## 2019-08-16 NOTE — Progress Notes (Signed)
0952 Labs reviewed with and pt seen by Dr. Delton Coombes and pt approved for Cisplatin infusion today per MD. CXR report reviewed prior to accessing [facility] tolerated Cisplatin infusion well without complaints or incident. VSS upon discharge. Pt discharged self ambulatory in satisfactory condition

## 2019-08-16 NOTE — Progress Notes (Signed)
.   Pharmacist Chemotherapy Monitoring - Initial Assessment    Anticipated start date: 08/16/19   Regimen:  . Are orders appropriate based on the patient's diagnosis, regimen, and cycle? Yes . Does the plan date match the patient's scheduled date? Yes . Is the sequencing of drugs appropriate? Yes . Are the premedications appropriate for the patient's regimen? Yes . Prior Authorization for treatment is: Approved o If applicable, is the correct biosimilar selected based on the patient's insurance? not applicable  Organ Function and Labs: Marland Kitchen Are dose adjustments needed based on the patient's renal function, hepatic function, or hematologic function? No . Are appropriate labs ordered prior to the start of patient's treatment? Yes . Other organ system assessment, if indicated: N/A . The following baseline labs, if indicated, have been ordered: N/A  Dose Assessment: . Are the drug doses appropriate? Yes . Are the following correct: o Drug concentrations Yes o IV fluid compatible with drug Yes o Administration routes Yes o Timing of therapy Yes . If applicable, does the patient have documented access for treatment and/or plans for port-a-cath placement? yes . If applicable, have lifetime cumulative doses been properly documented and assessed? not applicable Lifetime Dose Tracking  No doses have been documented on this patient for the following tracked chemicals: Doxorubicin, Epirubicin, Idarubicin, Daunorubicin, Mitoxantrone, Bleomycin, Oxaliplatin, Carboplatin, Liposomal Doxorubicin  o   Toxicity Monitoring/Prevention: . The patient has the following take home antiemetics prescribed: Prochlorperazine . The patient has the following take home medications prescribed: N/A . Medication allergies and previous infusion related reactions, if applicable, have been reviewed and addressed. Yes . The patient's current medication list has been assessed for drug-drug interactions with their  chemotherapy regimen. no significant drug-drug interactions were identified on review.  Order Review: . Are the treatment plan orders signed? No . Is the patient scheduled to see a provider prior to their treatment? Yes  I verify that I have reviewed each item in the above checklist and answered each question accordingly.  Wynona Neat 08/16/2019 9:56 AM

## 2019-08-16 NOTE — Patient Instructions (Signed)
[facility] °Discharge Instructions for Patients Receiving Chemotherapy ° ° °Beginning January 23rd 2017 lab work for the Cancer Center will be done in the  °Main lab at [facility] on 1st floor. °If you have a lab appointment with the Cancer Center please come in thru the  °Main Entrance and check in at the main information desk ° ° °Today you received the following chemotherapy agents Cisplatin. Follow-up as scheduled ° °To help prevent nausea and vomiting after your treatment, we encourage you to take your nausea medication °  °If you develop nausea and vomiting, or diarrhea that is not controlled by your medication, call the clinic. ° °The clinic phone number is [phone]. Office hours are Monday-Friday 8:30am-5:00pm. ° °BELOW ARE SYMPTOMS THAT SHOULD BE REPORTED IMMEDIATELY: °· *FEVER GREATER THAN 101.0 F °· *CHILLS WITH OR WITHOUT FEVER °· NAUSEA AND VOMITING THAT IS NOT CONTROLLED WITH YOUR NAUSEA MEDICATION °· *UNUSUAL SHORTNESS OF BREATH °· *UNUSUAL BRUISING OR BLEEDING °· TENDERNESS IN MOUTH AND THROAT WITH OR WITHOUT PRESENCE OF ULCERS °· *URINARY PROBLEMS °· *BOWEL PROBLEMS °· UNUSUAL RASH °Items with * indicate a potential emergency and should be followed up as soon as possible. °If you have an emergency after office hours please contact your primary care physician or go to the nearest emergency department. ° °Please call the clinic during office hours if you have any questions or concerns.  ° °You may also contact the Patient Navigator at [phone] should you have any questions or need assistance in obtaining follow up care. ° ° ° ° ° °Resources For Cancer Patients and their Caregivers °? American Cancer Society: °Can assist with transportation, wigs, general needs, runs Look Good Feel Better.        °1-888-227-6333 °? Cancer Care: °Provides financial assistance, online support groups, medication/co-pay assistance.  °1-800-813-HOPE (4673) °? [facility] °Assists [place] cancer patients and their families through emotional , educational and financial support.  336-427-4357 °? [place] DSS °Where to apply for food stamps, Medicaid and utility assistance. °336-342-1394 °? [facility]: °Transportation to medical appointments. 336-347-2287 °? Social Security Administration: °May apply for disability if have a Stage IV cancer. 336-342-7796 1-800-772-1213 °? [place] [facility]ging, Disability and Transit Services: °Assists with nutrition, care and transit needs. 336-349-2343 °  ° ° ° ° ° ° °

## 2019-08-16 NOTE — Patient Instructions (Signed)
[facility] at [facility] Discharge Instructions  Labs drawn from portacath today   Thank you for choosing [facility] at [facility] to provide your oncology and hematology care.  To afford each patient quality time with our provider, please arrive at least 15 minutes before your scheduled appointment time.   If you have a lab appointment with the Cancer Center please come in thru the Main Entrance and check in at the main information desk.  You need to re-schedule your appointment should you arrive 10 or more minutes late.  We strive to give you quality time with our providers, and arriving late affects you and other patients whose appointments are after yours.  Also, if you no show three or more times for appointments you may be dismissed from the clinic at the providers discretion.     Again, thank you for choosing [facility].  Our hope is that these requests will decrease the amount of time that you wait before being seen by our physicians.       _____________________________________________________________  Should you have questions after your visit to [facility], please contact our office at [phone] between the hours of 8:00 a.m. and 4:30 p.m.  Voicemails left after 4:00 p.m. will not be returned until the following business day.  For prescription refill requests, have your pharmacy contact our office and allow 72 hours.    Due to Covid, you will need to wear a mask upon entering the hospital. If you do not have a mask, a mask will be given to you at the Main Entrance upon arrival. For doctor visits, patients may have 1 support person with them. For treatment visits, patients can not have anyone with them due to social distancing guidelines and our immunocompromised population.     

## 2019-08-17 ENCOUNTER — Ambulatory Visit (HOSPITAL_COMMUNITY): Payer: PPO

## 2019-08-17 ENCOUNTER — Inpatient Hospital Stay (HOSPITAL_COMMUNITY): Payer: PPO

## 2019-08-17 ENCOUNTER — Encounter (HOSPITAL_COMMUNITY): Payer: Self-pay

## 2019-08-17 VITALS — BP 152/58 | HR 87 | Temp 97.7°F | Resp 18

## 2019-08-17 DIAGNOSIS — E860 Dehydration: Secondary | ICD-10-CM | POA: Insufficient documentation

## 2019-08-17 DIAGNOSIS — C099 Malignant neoplasm of tonsil, unspecified: Secondary | ICD-10-CM

## 2019-08-17 DIAGNOSIS — Z5111 Encounter for antineoplastic chemotherapy: Secondary | ICD-10-CM | POA: Diagnosis not present

## 2019-08-17 DIAGNOSIS — C090 Malignant neoplasm of tonsillar fossa: Secondary | ICD-10-CM | POA: Diagnosis not present

## 2019-08-17 MED ORDER — SODIUM CHLORIDE 0.9 % IV SOLN
Freq: Once | INTRAVENOUS | Status: AC
  Filled 2019-08-17: qty 1000

## 2019-08-17 MED ORDER — HEPARIN SOD (PORK) LOCK FLUSH 100 UNIT/ML IV SOLN
500.0000 [IU] | Freq: Once | INTRAVENOUS | Status: AC
  Administered 2019-08-17: 500 [IU] via INTRAVENOUS

## 2019-08-17 MED ORDER — SODIUM CHLORIDE 0.9% FLUSH
10.0000 mL | INTRAVENOUS | Status: DC | PRN
  Administered 2019-08-17: 10 mL via INTRAVENOUS

## 2019-08-17 NOTE — Progress Notes (Signed)
Kristi Weiss tolerated IV hydration with magnesium and potassium well without complaints or incident. VSS upon discharge. 24 hr ChemoF/U : Pt reports feeling very full yesterday evening after chemo tx and fluids but better this am. Pt denies any N+V,dyspnea,fever or diarrhea at this time. She has experienced a H/A off and on since yesterday but has taken Tylenol which has helped some.Pt discharged self ambulatory in satisfactory condition

## 2019-08-17 NOTE — Patient Instructions (Signed)
[facility] Discharge Instructions  Received IV hydration with magnesium and potassium today. Follow-up as scheduled.   Thank you for choosing [place] at [facility] to provide your oncology and hematology care.  To afford each patient quality time with our provider, please arrive at least 15 minutes before your scheduled appointment time.   If you have a lab appointment with the [facility] please come in thru the Main Entrance and check in at the main information desk.  You need to re-schedule your appointment should you arrive 10 or more minutes late.  We strive to give you quality time with our providers, and arriving late affects you and other patients whose appointments are after yours.  Also, if you no show three or more times for appointments you may be dismissed from the clinic at the providers discretion.     Again, thank you for choosing [facility].  Our hope is that these requests will decrease the amount of time that you wait before being seen by our physicians.       _____________________________________________________________  Should you have questions after your visit to [facility], please contact our office at [phone] between the hours of 8:00 a.m. and 4:30 p.m.  Voicemails left after 4:00 p.m. will not be returned until the following business day.  For prescription refill requests, have your pharmacy contact our office and allow 72 hours.    Due to Covid, you will need to wear a mask upon entering the hospital. If you do not have a mask, a mask will be given to you at the Main Entrance upon arrival. For doctor visits, patients may have 1 support person with them. For treatment visits, patients can not have anyone with them due to social distancing guidelines and our immunocompromised population.

## 2019-08-18 DIAGNOSIS — C090 Malignant neoplasm of tonsillar fossa: Secondary | ICD-10-CM | POA: Diagnosis not present

## 2019-08-20 ENCOUNTER — Other Ambulatory Visit: Payer: Self-pay

## 2019-08-20 ENCOUNTER — Encounter (HOSPITAL_COMMUNITY): Payer: Self-pay

## 2019-08-20 ENCOUNTER — Emergency Department (HOSPITAL_COMMUNITY)
Admission: EM | Admit: 2019-08-20 | Discharge: 2019-08-20 | Disposition: A | Discharge status: Home / Self Care | Payer: PPO | Attending: Emergency Medicine | Admitting: Emergency Medicine

## 2019-08-20 DIAGNOSIS — R519 Headache, unspecified: Secondary | ICD-10-CM | POA: Diagnosis not present

## 2019-08-20 NOTE — ED Triage Notes (Signed)
Pt arrives from home c/o worsening headache since Friday. Pt reports recently beginning Chemo Therapy this past week for left tonsillar cancer/lymph nodes. Pt unsure if this is the cause, but does report having headaches in the past. Pt has tried Tylenol and fluids at home without relief from symptoms.

## 2019-08-21 DIAGNOSIS — C099 Malignant neoplasm of tonsil, unspecified: Secondary | ICD-10-CM | POA: Diagnosis not present

## 2019-08-21 DIAGNOSIS — K219 Gastro-esophageal reflux disease without esophagitis: Secondary | ICD-10-CM | POA: Diagnosis not present

## 2019-08-21 DIAGNOSIS — Z6832 Body mass index (BMI) 32.0-32.9, adult: Secondary | ICD-10-CM | POA: Diagnosis not present

## 2019-08-21 DIAGNOSIS — R5383 Other fatigue: Secondary | ICD-10-CM | POA: Diagnosis not present

## 2019-08-21 DIAGNOSIS — R519 Headache, unspecified: Secondary | ICD-10-CM | POA: Diagnosis not present

## 2019-08-22 ENCOUNTER — Inpatient Hospital Stay (HOSPITAL_COMMUNITY): Payer: PPO | Admitting: General Practice

## 2019-08-22 DIAGNOSIS — C099 Malignant neoplasm of tonsil, unspecified: Secondary | ICD-10-CM

## 2019-08-22 DIAGNOSIS — C090 Malignant neoplasm of tonsillar fossa: Secondary | ICD-10-CM | POA: Diagnosis not present

## 2019-08-22 NOTE — Progress Notes (Signed)
[place] Initial Psychosocial Assessment Clinical Social Work  Clinical Social Work contacted by phone to assess psychosocial, emotional, mental health, and spiritual needs of the patient.   Barriers to care/review of distress screen:  - Transportation:  Do you anticipate any problems getting to appointments?  Do you have someone who can help run errands for you if you need it?  Driving herself - Help at home:  What is your living situation (alone, family, other)?  If you are physically unable to care for yourself, who would you call on to help you?  Lives by herself but daughter lives next door.  Daughter works during the day.  Also has 72 yo autistic grandson, "he is with me a lot of the time."  Takes care of large property, has done all the yard work herself since husband passed away several years ago.   - Support system:  What does your support system look like?  Who would you call on if you needed some kind of practical help?  What if you needed someone to talk to for emotional support?  Daughter and grandson.  Family is "mostly gone", but does have people I could count on if needed.  Talks more honestly and candidly w nephew who is supportive.   - Finances:  Are you concerned about finances.  Considering returning to work?  If not, applying for disability?  Has Medicare, has some retirement income.  Is concerned about large deductible w her Medicare plan.  Works w her Medical illustrator to make decisions re her Medicare choices, will ask them for a review to make sure she is on the right plan.    What is your understanding of where you are with your cancer? Its cause?  Your treatment plan and what happens next?  Newly diagnosed with tonsillar cancer, started chemotherapy last week.  Began to have issues after mowing last year, was initially told she had acid reflux.  Then went to ENT, after investigation did not see any issues.  Mother had throat cancer, "so I was really scared."  Had enlarged gland, then  had scans/biopsy/ultrasound.  Discovered she had cancer in cancer lymph node.  Was months since she first thought she had a concerning symptom to actual diagnosis of cancer.  Experienced severe headaches last week post infusion - went to her PCP to find out cause.  "Was worst headache I have ever had in my life, stayed on the sofa and did not do anything."  Today is first day she has felt somewhat better. PCP felt headache was not related to chemotherapy.  "Today is the first day I have felt halfway normal."    Is also doing radiation 5 days/week.  "Its been a challenge to get to both places at once."  Does radiation at 8 AM, then if she needs chemo, she will then go to [facility] for chemotherapy.  Will have 6 -7 weeks of concurrent treatment.  No concerns w eating or drinking at this time, has been advised that she may have issues w swallowing at some point.  Is preparing for this.  Has not seen speech therapist, was encouraged to explore this option of a referral from her radiation provider.    What are your worries for the future as you begin treatment for cancer?  "How I am going to feel next week/next day?"  Worried about "how bad it will get."  Not knowing whether she will not be able to swallow.  "I had so much  thrown at me at first, all of it hit me at first, I was really having a rough time w it."  Overloaded w information, stressed, "I might need a therapist after all this."    What are your hopes and priorities during your treatment? What is important to you? What are your goals for your care?  Wants to return to normal as soon as possible - likes to be independent/self sufficient.  Somewhat concerned about finances and copays.  "Worrying about how bad I am going to feel - I have never had any health concerns."  Fears of diarrhea, vomiting, "not feeling well at all."  "I just don't want to be sick" but has prepared for possible side effects.    CSW Summary:  Patient and family psychosocial functioning  including strengths, limitations, and coping skills: 74 year old widowed female, newly diagnosed w tonsillar cancer, in concurrent chemotherapy and radiation for 6 - 7 weeks.  Used to being fully independent and self sufficient - has been quite stressed by demands of treatment, uncertainty of diagnosis, feeling unwell post infusion, concerns about how much treatments will impact her ability to remain independent.  Lives alone but has good support from daughter who lives next door and 27 year old grandson who has "high functioning autism."  She is working to find ways to regain control over her life and learn how to best manage her situation to regain her previous state of health.    Identifications of barriers to care:  Availability of community resources: Duanne Limerick, [employer] resources, speech therapy.   Clinical Social Worker follow up needed: No.   Edwyna Shell, [place] Social Worker Phone:  [phone] Cell:  [phone]

## 2019-08-23 ENCOUNTER — Encounter (HOSPITAL_COMMUNITY): Payer: Self-pay

## 2019-08-23 ENCOUNTER — Inpatient Hospital Stay (HOSPITAL_COMMUNITY): Payer: PPO

## 2019-08-23 ENCOUNTER — Inpatient Hospital Stay (HOSPITAL_COMMUNITY): Payer: PPO | Admitting: Oncology

## 2019-08-23 ENCOUNTER — Encounter (HOSPITAL_COMMUNITY): Payer: Self-pay | Admitting: Oncology

## 2019-08-23 ENCOUNTER — Other Ambulatory Visit: Payer: Self-pay

## 2019-08-23 VITALS — BP 147/58 | HR 88 | Temp 98.1°F | Resp 18 | Wt 212.9 lb

## 2019-08-23 VITALS — BP 140/52 | HR 78 | Temp 97.7°F | Resp 18

## 2019-08-23 DIAGNOSIS — E860 Dehydration: Secondary | ICD-10-CM

## 2019-08-23 DIAGNOSIS — E039 Hypothyroidism, unspecified: Secondary | ICD-10-CM

## 2019-08-23 DIAGNOSIS — F418 Other specified anxiety disorders: Secondary | ICD-10-CM | POA: Diagnosis not present

## 2019-08-23 DIAGNOSIS — R4589 Other symptoms and signs involving emotional state: Secondary | ICD-10-CM | POA: Insufficient documentation

## 2019-08-23 DIAGNOSIS — C099 Malignant neoplasm of tonsil, unspecified: Secondary | ICD-10-CM | POA: Diagnosis not present

## 2019-08-23 DIAGNOSIS — G8929 Other chronic pain: Secondary | ICD-10-CM | POA: Diagnosis not present

## 2019-08-23 DIAGNOSIS — C090 Malignant neoplasm of tonsillar fossa: Secondary | ICD-10-CM | POA: Diagnosis not present

## 2019-08-23 DIAGNOSIS — R519 Headache, unspecified: Secondary | ICD-10-CM | POA: Diagnosis not present

## 2019-08-23 DIAGNOSIS — Z5111 Encounter for antineoplastic chemotherapy: Secondary | ICD-10-CM | POA: Diagnosis not present

## 2019-08-23 DIAGNOSIS — Z95828 Presence of other vascular implants and grafts: Secondary | ICD-10-CM

## 2019-08-23 HISTORY — DX: Other specified anxiety disorders: F41.8

## 2019-08-23 HISTORY — DX: Other symptoms and signs involving emotional state: R45.89

## 2019-08-23 HISTORY — DX: Hypothyroidism, unspecified: E03.9

## 2019-08-23 LAB — COMPREHENSIVE METABOLIC PANEL
ALT: 34 U/L (ref 0–44)
AST: 19 U/L (ref 15–41)
Albumin: 3.9 g/dL (ref 3.5–5.0)
Alkaline Phosphatase: 94 U/L (ref 38–126)
Anion gap: 10 (ref 5–15)
BUN: 15 mg/dL (ref 8–23)
CO2: 28 mmol/L (ref 22–32)
Calcium: 8.9 mg/dL (ref 8.9–10.3)
Chloride: 99 mmol/L (ref 98–111)
Creatinine, Ser: 0.74 mg/dL (ref 0.44–1.00)
GFR calc Af Amer: >60 mL/min (ref >60)
GFR calc non Af Amer: >60 mL/min (ref >60)
Glucose, Bld: 135 mg/dL — ABNORMAL HIGH (ref 70–99)
Potassium: 4.1 mmol/L (ref 3.5–5.1)
Sodium: 137 mmol/L (ref 135–145)
Total Bilirubin: 0.7 mg/dL (ref 0.3–1.2)
Total Protein: 6.9 g/dL (ref 6.5–8.1)

## 2019-08-23 LAB — CBC WITH DIFFERENTIAL/PLATELET
Abs Immature Granulocytes: 0.03 10*3/uL (ref 0.00–0.07)
Basophils Absolute: 0 10*3/uL (ref 0.0–0.1)
Basophils Relative: 0 %
Eosinophils Absolute: 0.1 10*3/uL (ref 0.0–0.5)
Eosinophils Relative: 1 %
HCT: 41.3 % (ref 36.0–46.0)
Hemoglobin: 13.5 g/dL (ref 12.0–15.0)
Immature Granulocytes: 0 %
Lymphocytes Relative: 26 %
Lymphs Abs: 1.9 10*3/uL (ref 0.7–4.0)
MCH: 29.9 pg (ref 26.0–34.0)
MCHC: 32.7 g/dL (ref 30.0–36.0)
MCV: 91.4 fL (ref 80.0–100.0)
Monocytes Absolute: 0.5 10*3/uL (ref 0.1–1.0)
Monocytes Relative: 6 %
Neutro Abs: 4.8 10*3/uL (ref 1.7–7.7)
Neutrophils Relative %: 67 %
Platelets: 272 10*3/uL (ref 150–400)
RBC: 4.52 MIL/uL (ref 3.87–5.11)
RDW: 13 % (ref 11.5–15.5)
WBC: 7.3 10*3/uL (ref 4.0–10.5)
nRBC: 0 % (ref 0.0–0.2)

## 2019-08-23 LAB — MAGNESIUM: Magnesium: 2 mg/dL (ref 1.7–2.4)

## 2019-08-23 MED ORDER — PALONOSETRON HCL INJECTION 0.25 MG/5ML
INTRAVENOUS | Status: AC
  Filled 2019-08-23: qty 5

## 2019-08-23 MED ORDER — PALONOSETRON HCL INJECTION 0.25 MG/5ML
0.2500 mg | Freq: Once | INTRAVENOUS | Status: AC
  Administered 2019-08-23: 0.25 mg via INTRAVENOUS

## 2019-08-23 MED ORDER — BUTALBITAL-APAP-CAFFEINE 50-325-40 MG PO TABS: 1.0000 | ORAL_TABLET | Freq: Four times a day (QID) | ORAL | 0 refills | Status: DC | PRN

## 2019-08-23 MED ORDER — SODIUM CHLORIDE 0.9% FLUSH
10.0000 mL | INTRAVENOUS | Status: DC | PRN
  Administered 2019-08-23: 10 mL via INTRAVENOUS

## 2019-08-23 MED ORDER — HEPARIN SOD (PORK) LOCK FLUSH 100 UNIT/ML IV SOLN
500.0000 [IU] | Freq: Once | INTRAVENOUS | Status: AC | PRN
  Administered 2019-08-23: 500 [IU]

## 2019-08-23 MED ORDER — POTASSIUM CHLORIDE 2 MEQ/ML IV SOLN
Freq: Once | INTRAVENOUS | Status: AC
  Filled 2019-08-23: qty 10

## 2019-08-23 MED ORDER — SODIUM CHLORIDE 0.9 % IV SOLN
40.0000 mg/m2 | Freq: Once | INTRAVENOUS | Status: AC
  Administered 2019-08-23: 84 mg via INTRAVENOUS
  Filled 2019-08-23: qty 84

## 2019-08-23 MED ORDER — SODIUM CHLORIDE 0.9% FLUSH
10.0000 mL | INTRAVENOUS | Status: DC | PRN
  Administered 2019-08-23: 10 mL

## 2019-08-23 MED ORDER — SODIUM CHLORIDE 0.9 % IV SOLN: Freq: Once | INTRAVENOUS | Status: AC

## 2019-08-23 MED ORDER — SODIUM CHLORIDE 0.9 % IV SOLN
10.0000 mg | Freq: Once | INTRAVENOUS | Status: AC
  Administered 2019-08-23: 10 mg via INTRAVENOUS
  Filled 2019-08-23: qty 10

## 2019-08-23 MED ORDER — SODIUM CHLORIDE 0.9 % IV SOLN
150.0000 mg | Freq: Once | INTRAVENOUS | Status: AC
  Administered 2019-08-23: 150 mg via INTRAVENOUS
  Filled 2019-08-23: qty 150

## 2019-08-23 NOTE — Progress Notes (Signed)
Patient presents today for treatment and follow up visit with TKefalas PA. Vital signs within parameters for treatment.  Labs within parameters for treatment.   Message received from [facility] LPN/ [place] PA. Proceed with treatment.   Patient voided 284mls of urine.

## 2019-08-23 NOTE — Progress Notes (Addendum)
[place] School, MD [address] 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  Staging PET scan 07/18/2019 demonstrated a 2.2 cm mass with SUV 16.2 with small 8 mm lymph node.  She started weekly cisplatin with XRT on 08/16/2019.  CURRENT STATUS: Kristi Weiss 74 y.o. female returns for followup of in follow-up of head and neck cancer currently undergoing cisplatin + XRT.  She tolerated her first cycle of cisplatin reasonably well.  She did develop a headache that required her to go to the emergency department.  Work-up was negative.  She does admit to an underlying history of headaches on a regular basis but her emergency room visit was prompted by acute on chronic headache that she could not control at home.  Interestingly, she recently stopped all caffeine consumption and I wonder if this participate in her headache.  She stopped consuming caffeine knowing that she needs to increase her H2O intake and the fact that caffeinated beverages do not count toward her H2O intake calculation.  She reports that her headache involved her entire head.  She has seen Dr. Jaynee Eagles in the past for her headaches.  She was prescribed hydrocodone for headache control but she is afraid to take opiate medication because of her husband suffering from chronic pain and being opiate dependent.  We discussed other options including Fioricet and she is agreeable to try this for headache exacerbation.  She is following okay without problems with dysphagia.  Appetite is good and weight is stable.  She denies any throat burning or pain.  She does have follow-up with speech therapy/nutrition in the near future.  Review of Systems  Constitutional: Negative.  Negative for chills, fever and weight loss.  HENT:  Negative.   Eyes: Negative.   Respiratory: Negative.  Negative for cough.   Cardiovascular: Negative.  Negative for chest pain.  Gastrointestinal: Negative.  Negative for blood in stool, constipation, diarrhea, melena, nausea and vomiting.  Genitourinary: Negative.   Musculoskeletal: Negative.   Skin: Negative.   Neurological: Positive for headaches (Chronic with a recent exacerbation.). Negative for weakness.  Endo/Heme/Allergies: Negative.   Psychiatric/Behavioral: Negative.     Past Medical History:  Diagnosis Date  . Anxiety about health 08/23/2019  . Arthritis   . GERD (gastroesophageal reflux disease)   . Hypothyroidism   . Hypothyroidism (acquired) 08/23/2019  . 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 Port-A-Cath in place 08/09/2019  . 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 Scoliosis   . Sinusitis   . 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     PHYSICAL EXAMINATION  ECOG PERFORMANCE STATUS: 1 - Symptomatic but completely ambulatory  Vitals:   08/23/19 0850  BP: (!) 147/58  Pulse: 88  Resp: 18  Temp: 98.1 F (36.7 C)  SpO2: 97%    GENERAL:alert, no distress, well nourished, well developed, comfortable, cooperative, obese, smiling and unaccompanied SKIN: skin color, texture, turgor are normal, no rashes or significant lesions HEAD: Normocephalic, No masses, lesions, tenderness or abnormalities EYES: normal EARS: External ears normal OROPHARYNX: Not examined due to mask NECK: supple LYMPH:  not  examined BREAST:not examined LUNGS: clear to auscultation  HEART: regular rate & rhythm ABDOMEN:abdomen soft, obese and normal bowel sounds BACK: Back symmetric, no curvature. EXTREMITIES:less then 2 second capillary refill, no skin discoloration  NEURO: alert & oriented x 3 with fluent  speech, no focal motor/sensory deficits, gait normal   LABORATORY DATA: CBC    Component Value Date/Time   WBC 7.3 08/23/2019 0906   RBC 4.52 08/23/2019 0906   HGB 13.5 08/23/2019 0906   HCT 41.3 08/23/2019 0906   PLT 272 08/23/2019 0906   MCV 91.4 08/23/2019 0906   MCH 29.9 08/23/2019 0906   MCHC 32.7 08/23/2019 0906   RDW 13.0 08/23/2019 0906   LYMPHSABS 1.9 08/23/2019 0906   MONOABS 0.5 08/23/2019 0906   EOSABS 0.1 08/23/2019 0906   BASOSABS 0.0 08/23/2019 0906      Chemistry      Component Value Date/Time   NA 137 08/23/2019 0906   NA 142 04/05/2019 1429   K 4.1 08/23/2019 0906   CL 99 08/23/2019 0906   CO2 28 08/23/2019 0906   BUN 15 08/23/2019 0906   BUN 14 04/05/2019 1429   CREATININE 0.74 08/23/2019 0906      Component Value Date/Time   CALCIUM 8.9 08/23/2019 0906   ALKPHOS 94 08/23/2019 0906   AST 19 08/23/2019 0906   ALT 34 08/23/2019 0906   BILITOT 0.7 08/23/2019 0906       RADIOGRAPHIC STUDIES:  DG Chest Port 1 View  Result Date: 08/01/2019 CLINICAL DATA:  Port-A-Cath placement. EXAM: PORTABLE CHEST 1 VIEW COMPARISON:  PET-CT 07/18/2019. FINDINGS: PowerPort catheter noted with tip over SVC. Heart size normal. No pulmonary venous congestion. Low lung volumes. No pleural effusion or pneumothorax. Prominent thoracic spine scoliosis concave left. Patient is rotated to the left. IMPRESSION: PowerPort catheter noted in good anatomic position. No pneumothorax. Electronically Signed   By: Marcello Moores  Register   On: 08/01/2019 08:37   DG C-Arm 1-60 Min-No Report  Result Date: 08/01/2019 Fluoroscopy was utilized by the requesting physician.  No radiographic interpretation.       ASSESSMENT AND PLAN:  1. 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  Staging PET scan 07/18/2019 demonstrated a 2.2 cm mass with SUV 16.2 with small 8 mm lymph node.  She started weekly cisplatin with XRT on  08/16/2019.  Labs today updated today.  Blood counts are within normal limits.  Metabolic panel reveals an excellent BUN and creatinine and liver function tests are within normal limits. No electrolyte abnormality.  Magnesium is within normal limits as well at 2.0.  Labs satisfy treatment parameters today to embark on C2 of weekly Cisplatin.  She will receive the planned pre-treatment and post-treatment IV fluids.  Nursing, in accordance with chemotherapy administration protocol, will monitor for acute side effects/toxicities associated with chemotherapy administration today.  I recommend she return tomorrow for additional IVF and anti-emetics if needed.  Continue XRT as directed.  She complains of chronic headaches that predate her diagnosis and treatment but with acute exacerbation recently.  She is afraid to take opiate therapy for headache but is interested in trying Fioricet for acute headache exacerbated.  Headaches have been previously managed by Dr. Jaynee Eagles.  Most recent exacerbation was worked up in the emergency department with negative findings.  Rx for Fiorcet #10 send to her preferred pharmacy.  All notes and imaging are reviewed related to her cancer care.  Return in 1 week for  follow-up and ongoing treatment.  2. Dehydration She is receiving IVF with treatment and again on Friday.  She is encouraged to consume 2-3 L of PO H2O daily.  3. Hypothyroidism (acquired) On Levothyroxine therapy.  4. Anxiety about health Will continue to monitor and consider anti-anxiolytic therapy if indicated in the future.   ORDERS PLACED FOR THIS ENCOUNTER: No orders of the defined types were placed in this encounter.   MEDICATIONS PRESCRIBED THIS ENCOUNTER: No orders of the defined types were placed in this encounter.   All questions were answered. The patient knows to call the clinic with any problems, questions or concerns. We can certainly see the patient much sooner if  necessary.  Patient and plan discussed with Dr. Derek Jack and he is in agreement with the aforementioned.   This note is electronically signed by: Robynn Pane, PA-C 08/23/2019 9:44 AM

## 2019-08-23 NOTE — Patient Instructions (Signed)
[facility] Discharge Instructions for Patients Receiving Chemotherapy  Today you received the following chemotherapy agents   To help prevent nausea and vomiting after your treatment, we encourage you to take your nausea medication   If you develop nausea and vomiting that is not controlled by your nausea medication, call the clinic.   BELOW ARE SYMPTOMS THAT SHOULD BE REPORTED IMMEDIATELY:  *FEVER GREATER THAN 100.5 F  *CHILLS WITH OR WITHOUT FEVER  NAUSEA AND VOMITING THAT IS NOT CONTROLLED WITH YOUR NAUSEA MEDICATION  *UNUSUAL SHORTNESS OF BREATH  *UNUSUAL BRUISING OR BLEEDING  TENDERNESS IN MOUTH AND THROAT WITH OR WITHOUT PRESENCE OF ULCERS  *URINARY PROBLEMS  *BOWEL PROBLEMS  UNUSUAL RASH Items with * indicate a potential emergency and should be followed up as soon as possible.  Feel free to call the clinic should you have any questions or concerns. The clinic phone number is [phone].  Please show the CHEMO ALERT CARD at check-in to the Emergency Department and triage nurse.   

## 2019-08-23 NOTE — Addendum Note (Signed)
Addended by: Baird Cancer on: 08/23/2019 11:17 AM   Modules accepted: Orders

## 2019-08-23 NOTE — Patient Instructions (Signed)
[facility] at [facility] Discharge Instructions  You were seen today by Tom Kefalas PA. He went over your recent lab results. He will see you back in 1 week for labs, treatment and follow up.   Thank you for choosing [facility] at [facility] to provide your oncology and hematology care.  To afford each patient quality time with our provider, please arrive at least 15 minutes before your scheduled appointment time.   If you have a lab appointment with the Cancer Center please come in thru the  Main Entrance and check in at the main information desk  You need to re-schedule your appointment should you arrive 10 or more minutes late.  We strive to give you quality time with our providers, and arriving late affects you and other patients whose appointments are after yours.  Also, if you no show three or more times for appointments you may be dismissed from the clinic at the providers discretion.     Again, thank you for choosing [facility].  Our hope is that these requests will decrease the amount of time that you wait before being seen by our physicians.       _____________________________________________________________  Should you have questions after your visit to [facility], please contact our office at [phone] between the hours of 8:00 a.m. and 4:30 p.m.  Voicemails left after 4:00 p.m. will not be returned until the following business day.  For prescription refill requests, have your pharmacy contact our office and allow 72 hours.    Cancer Center Support Programs:   > Cancer Support Group  2nd Tuesday of the month 1pm-2pm, Journey Room    

## 2019-08-23 NOTE — Progress Notes (Signed)
Patient has been assessed, vital signs and labs have been reviewed by Tom Kefalas PA. ANC, Creatinine, LFTs, and Platelets are within treatment parameters per Tom Kefalas PA. The patient is good to proceed with treatment at this time.   

## 2019-08-24 ENCOUNTER — Inpatient Hospital Stay (HOSPITAL_COMMUNITY): Payer: PPO

## 2019-08-24 ENCOUNTER — Ambulatory Visit (HOSPITAL_COMMUNITY): Payer: PPO

## 2019-08-24 VITALS — BP 145/63 | HR 76 | Temp 97.5°F | Resp 18

## 2019-08-24 DIAGNOSIS — C099 Malignant neoplasm of tonsil, unspecified: Secondary | ICD-10-CM

## 2019-08-24 DIAGNOSIS — E860 Dehydration: Secondary | ICD-10-CM

## 2019-08-24 DIAGNOSIS — Z5111 Encounter for antineoplastic chemotherapy: Secondary | ICD-10-CM | POA: Diagnosis not present

## 2019-08-24 DIAGNOSIS — C090 Malignant neoplasm of tonsillar fossa: Secondary | ICD-10-CM | POA: Diagnosis not present

## 2019-08-24 MED ORDER — SODIUM CHLORIDE 0.9 % IV SOLN
Freq: Once | INTRAVENOUS | Status: AC
  Filled 2019-08-24: qty 1000

## 2019-08-24 MED ORDER — HEPARIN SOD (PORK) LOCK FLUSH 100 UNIT/ML IV SOLN
500.0000 [IU] | Freq: Once | INTRAVENOUS | Status: AC | PRN
  Administered 2019-08-24: 500 [IU]

## 2019-08-24 MED ORDER — SODIUM CHLORIDE 0.9% FLUSH
10.0000 mL | Freq: Once | INTRAVENOUS | Status: AC | PRN
  Administered 2019-08-24: 10 mL

## 2019-08-24 NOTE — Patient Instructions (Signed)
[facility] at [facility]  Discharge Instructions:   _______________________________________________________________  Thank you for choosing [facility] at [facility] to provide your oncology and hematology care.  To afford each patient quality time with our providers, please arrive at least 15 minutes before your scheduled appointment.  You need to re-schedule your appointment if you arrive 10 or more minutes late.  We strive to give you quality time with our providers, and arriving late affects you and other patients whose appointments are after yours.  Also, if you no show three or more times for appointments you may be dismissed from the clinic.  Again, thank you for choosing [facility] at [facility]. Our hope is that these requests will allow you access to exceptional care and in a timely manner. _______________________________________________________________  If you have questions after your visit, please contact our office at [phone] between the hours of 8:30 a.m. and 5:00 p.m. Voicemails left after 4:30 p.m. will not be returned until the following business day. _______________________________________________________________  For prescription refill requests, have your pharmacy contact our office. _______________________________________________________________  Recommendations made by the consultant and any test results will be sent to your referring physician. _______________________________________________________________ 

## 2019-08-24 NOTE — Progress Notes (Signed)
Patient presents today for hydration. Vital signs stable. Patient has no complaints of any significant changes since her last visit. Patient states she has a worse headache today than yesterday. Patient rates the headache a 7/10.   Hydration  given today per MD orders. Tolerated infusion without adverse affects. Vital signs stable. No complaints at this time. Discharged from clinic ambulatory. F/U with [facility] as scheduled.

## 2019-08-25 ENCOUNTER — Encounter (HOSPITAL_COMMUNITY): Payer: PPO

## 2019-08-25 ENCOUNTER — Encounter (HOSPITAL_COMMUNITY): Payer: Self-pay

## 2019-08-25 DIAGNOSIS — C090 Malignant neoplasm of tonsillar fossa: Secondary | ICD-10-CM | POA: Diagnosis not present

## 2019-08-25 NOTE — Progress Notes (Signed)
Nutrition  Called patient for nutrition follow-up.  No answer.  Left message with call back number.   Abbegail Matuska B. Lawton Dollinger, RD, LDN Registered Dietitian [phone] (pager)   

## 2019-08-28 DIAGNOSIS — C090 Malignant neoplasm of tonsillar fossa: Secondary | ICD-10-CM | POA: Diagnosis not present

## 2019-08-29 DIAGNOSIS — C090 Malignant neoplasm of tonsillar fossa: Secondary | ICD-10-CM | POA: Diagnosis not present

## 2019-08-30 ENCOUNTER — Inpatient Hospital Stay (HOSPITAL_COMMUNITY): Payer: PPO

## 2019-08-30 ENCOUNTER — Inpatient Hospital Stay (HOSPITAL_COMMUNITY): Payer: PPO | Admitting: Nurse Practitioner

## 2019-08-30 VITALS — BP 123/71 | HR 70 | Temp 97.9°F | Resp 18

## 2019-08-30 DIAGNOSIS — Z95828 Presence of other vascular implants and grafts: Secondary | ICD-10-CM

## 2019-08-30 DIAGNOSIS — Z5111 Encounter for antineoplastic chemotherapy: Secondary | ICD-10-CM | POA: Diagnosis not present

## 2019-08-30 DIAGNOSIS — C099 Malignant neoplasm of tonsil, unspecified: Secondary | ICD-10-CM | POA: Diagnosis not present

## 2019-08-30 DIAGNOSIS — C090 Malignant neoplasm of tonsillar fossa: Secondary | ICD-10-CM | POA: Diagnosis not present

## 2019-08-30 LAB — CBC WITH DIFFERENTIAL/PLATELET
Abs Immature Granulocytes: 0.01 10*3/uL (ref 0.00–0.07)
Basophils Absolute: 0 10*3/uL (ref 0.0–0.1)
Basophils Relative: 0 %
Eosinophils Absolute: 0.1 10*3/uL (ref 0.0–0.5)
Eosinophils Relative: 1 %
HCT: 38.8 % (ref 36.0–46.0)
Hemoglobin: 12.6 g/dL (ref 12.0–15.0)
Immature Granulocytes: 0 %
Lymphocytes Relative: 18 %
Lymphs Abs: 1.2 10*3/uL (ref 0.7–4.0)
MCH: 30.2 pg (ref 26.0–34.0)
MCHC: 32.5 g/dL (ref 30.0–36.0)
MCV: 93 fL (ref 80.0–100.0)
Monocytes Absolute: 0.5 10*3/uL (ref 0.1–1.0)
Monocytes Relative: 8 %
Neutro Abs: 4.5 10*3/uL (ref 1.7–7.7)
Neutrophils Relative %: 73 %
Platelets: 228 10*3/uL (ref 150–400)
RBC: 4.17 MIL/uL (ref 3.87–5.11)
RDW: 13.2 % (ref 11.5–15.5)
WBC: 6.3 10*3/uL (ref 4.0–10.5)
nRBC: 0 % (ref 0.0–0.2)

## 2019-08-30 LAB — COMPREHENSIVE METABOLIC PANEL
ALT: 24 U/L (ref 0–44)
AST: 18 U/L (ref 15–41)
Albumin: 4.1 g/dL (ref 3.5–5.0)
Alkaline Phosphatase: 94 U/L (ref 38–126)
Anion gap: 10 (ref 5–15)
BUN: 12 mg/dL (ref 8–23)
CO2: 28 mmol/L (ref 22–32)
Calcium: 8.8 mg/dL — ABNORMAL LOW (ref 8.9–10.3)
Chloride: 99 mmol/L (ref 98–111)
Creatinine, Ser: 0.73 mg/dL (ref 0.44–1.00)
GFR calc Af Amer: >60 mL/min (ref >60)
GFR calc non Af Amer: >60 mL/min (ref >60)
Glucose, Bld: 101 mg/dL — ABNORMAL HIGH (ref 70–99)
Potassium: 4.3 mmol/L (ref 3.5–5.1)
Sodium: 137 mmol/L (ref 135–145)
Total Bilirubin: 0.8 mg/dL (ref 0.3–1.2)
Total Protein: 6.8 g/dL (ref 6.5–8.1)

## 2019-08-30 LAB — MAGNESIUM: Magnesium: 2.1 mg/dL (ref 1.7–2.4)

## 2019-08-30 MED ORDER — HEPARIN SOD (PORK) LOCK FLUSH 100 UNIT/ML IV SOLN
500.0000 [IU] | Freq: Once | INTRAVENOUS | Status: AC | PRN
  Administered 2019-08-30: 500 [IU]

## 2019-08-30 MED ORDER — SODIUM CHLORIDE 0.9 % IV SOLN
150.0000 mg | Freq: Once | INTRAVENOUS | Status: AC
  Administered 2019-08-30: 150 mg via INTRAVENOUS
  Filled 2019-08-30: qty 150

## 2019-08-30 MED ORDER — SODIUM CHLORIDE 0.9 % IV SOLN
40.0000 mg/m2 | Freq: Once | INTRAVENOUS | Status: AC
  Administered 2019-08-30: 84 mg via INTRAVENOUS
  Filled 2019-08-30: qty 84

## 2019-08-30 MED ORDER — SODIUM CHLORIDE 0.9 % IV SOLN
10.0000 mg | Freq: Once | INTRAVENOUS | Status: AC
  Administered 2019-08-30: 10 mg via INTRAVENOUS
  Filled 2019-08-30: qty 10

## 2019-08-30 MED ORDER — PALONOSETRON HCL INJECTION 0.25 MG/5ML
0.2500 mg | Freq: Once | INTRAVENOUS | Status: AC
  Administered 2019-08-30: 0.25 mg via INTRAVENOUS
  Filled 2019-08-30: qty 5

## 2019-08-30 MED ORDER — SODIUM CHLORIDE 0.9% FLUSH
10.0000 mL | Freq: Once | INTRAVENOUS | Status: AC
  Administered 2019-08-30: 10 mL

## 2019-08-30 MED ORDER — SODIUM CHLORIDE 0.9% FLUSH
10.0000 mL | INTRAVENOUS | Status: DC | PRN
  Administered 2019-08-30: 10 mL

## 2019-08-30 MED ORDER — POTASSIUM CHLORIDE 2 MEQ/ML IV SOLN
Freq: Once | INTRAVENOUS | Status: AC
  Filled 2019-08-30: qty 10

## 2019-08-30 MED ORDER — SODIUM CHLORIDE 0.9 % IV SOLN: Freq: Once | INTRAVENOUS | Status: AC

## 2019-08-30 NOTE — Progress Notes (Signed)
1000 Labs reviewed with and pt seen by RLockamy NP and pt approved for Cisplatin infusion today per NP                                  Kristi Weiss tolerated Cisplatin infusion well without complaints or incident. VSS upon discharge. Pt discharged self ambulatory in satisfactory condition

## 2019-08-30 NOTE — Progress Notes (Signed)
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[place])   07/26/2019 Cancer Staging   Staging form: Pharynx - HPV-Mediated Oropharynx, AJCC 8th Edition - Clinical stage from 07/26/2019: Stage I (cT2, cN1, cM0, p16+) - Signed by Derek Jack, MD on 07/26/2019   08/16/2019 -  Chemotherapy   The patient had palonosetron (ALOXI) injection 0.25 mg, 0.25 mg, Intravenous,  Once, 2 of 7 cycles Administration: 0.25 mg (08/16/2019), 0.25 mg (08/23/2019) CISplatin (PLATINOL) 84 mg in sodium chloride 0.9 % 250 mL chemo infusion, 40 mg/m2 = 84 mg, Intravenous,  Once, 2 of 7 cycles Administration: 84 mg (08/16/2019), 84 mg (08/23/2019) fosaprepitant (EMEND) 150 mg in sodium chloride 0.9 % 145 mL IVPB, 150 mg, Intravenous,  Once, 2 of 7 cycles Administration: 150 mg (08/16/2019), 150 mg (08/23/2019)  for chemotherapy treatment.      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 female returns for routine follow-up for primary tonsillar squamous cell carcinoma.  Patient reports she is tolerating her treatments well.  She does have some slight hearing loss in her left ear that is not any worse.  She does have mouth sores which makes it difficult for her to eat.  Denies any nausea, vomiting, or diarrhea. Denies any new  pains. Had not noticed any recent bleeding such as epistaxis, hematuria or hematochezia. Denies recent chest pain on exertion, shortness of breath on minimal exertion, pre-syncopal episodes, or palpitations. Denies any numbness or tingling in hands or feet. Denies any recent fevers, infections, or recent hospitalizations. Patient reports appetite at 50% and energy level at 75%.  She is maintain her weight at this time.     REVIEW OF SYSTEMS:  Review of Systems  HENT:   Positive for mouth sores.   Gastrointestinal: Positive for nausea.  Neurological: Positive for headaches.  Psychiatric/Behavioral: Positive for sleep disturbance.  All other systems reviewed and are negative.    PAST MEDICAL/SURGICAL HISTORY:  Past Medical History:  Diagnosis Date  . Anxiety about health 08/23/2019  . Arthritis   . GERD (gastroesophageal reflux disease)   . Hypothyroidism   . Hypothyroidism (acquired) 08/23/2019  . 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 Port-A-Cath in place 08/09/2019  . 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 Scoliosis   . Sinusitis   . 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   Past Surgical History:  Procedure Laterality Date  . NO PAST SURGERIES    . No prior surgery    . PORTACATH PLACEMENT Right 08/01/2019   Procedure: INSERTION PORT-A-CATH;  Surgeon: Aviva Signs, MD;  Location: [facility];  Service: General;  Laterality: Right;     SOCIAL HISTORY:  Social History   Socioeconomic History  . Marital status: Widowed    Spouse name: Not on file  . Number of children: 1  . Years of education: Not on file  . Highest education level: Not on file  Occupational History  . Occupation: RETIRED  Tobacco Use  . Smoking status: Former Research scientist (life sciences)  . Smokeless tobacco: Never Used  . Tobacco comment: some in  her 83s and 30s  Vaping Use  . Vaping Use: Never used  Substance and Sexual Activity  . Alcohol use: Yes    Comment: on rare occasion "like twice a year"  . Drug use: Never  . Sexual activity: Not Currently  Other Topics Concern  . Not on file  Social History Narrative   Lives at home alone   Retired   Widow   Caffeine: about 4 cups coffee, 3 glasses of tea daily   Social Determinants of Health   Financial Resource Strain: Low Risk   . Difficulty of Paying Living Expenses: Not hard at all  Food Insecurity: No Food Insecurity  . Worried About Charity fundraiser in the Last Year: Never true  . Ran Out of Food in the Last Year: Never true  Transportation Needs: No Transportation Needs  . Lack of Transportation (Medical): No  . Lack of Transportation (Non-Medical): No  Physical Activity: Sufficiently Active  . Days of Exercise per Week: 5 days  . Minutes of Exercise per Session: 30 min  Stress: Stress Concern Present  . Feeling of Stress : Very much  Social Connections:   . Frequency of Communication with Friends and Family:   . Frequency of Social Gatherings with Friends and Family:   . Attends Religious Services:   . Active Member of Clubs or Organizations:   . Attends Archivist Meetings:   Marland Kitchen Marital Status:   Intimate Partner Violence: Not At Risk  . Fear of Current or Ex-Partner: No  . Emotionally Abused: No  . Physically Abused: No  . Sexually Abused: No    FAMILY HISTORY:  Family History  Problem Relation Age of Onset  . Lung cancer Mother        smoker  . Cancer Mother        Throat  . Lung cancer Father        smoker  . Diabetes Father   . CVA Brother   . Diabetes Brother   . Diabetes Maternal Grandmother   . Heart attack Paternal Grandfather   . Migraines Neg Hx   . Headache Neg Hx     CURRENT MEDICATIONS:  Outpatient Encounter Medications as of 08/30/2019  Medication Sig  . CISplatin in sodium chloride 0.9 % 250 mL Inject 40 mg/m2  into the vein once a week.  . esomeprazole (NEXIUM) 40 MG capsule Take 40 mg by mouth daily.   . fluconazole (DIFLUCAN) 100 MG tablet Take by mouth.  . levothyroxine (SYNTHROID) 50 MCG tablet Take 50 mcg by mouth daily. BRAND  . PREVIDENT 5000 BOOSTER PLUS 1.1 % PSTE APPLY WITH TOOTHBRUSH AT BEDTIME  . butalbital-acetaminophen-caffeine (FIORICET) 50-325-40 MG tablet Take 1-2 tablets by mouth every 6 (six) hours as needed for headache (severe). (Patient not taking: Reported on 08/30/2019)  . diphenhydrAMINE (BENADRYL) 25 mg capsule Take 25 mg by mouth at bedtime as needed.  (Patient not taking: Reported on 08/30/2019)  . ibuprofen (ADVIL) 200 MG tablet Take 400-600 mg by mouth every 8 (eight) hours as needed for moderate pain.  (Patient  not taking: Reported on 08/30/2019)  . lidocaine-prilocaine (EMLA) cream Apply a small amount to port a cath site and cover with plastic wrap 1 hour prior to chemotherapy appointments (Patient not taking: Reported on 08/30/2019)  . prochlorperazine (COMPAZINE) 10 MG tablet Take 1 tablet (10 mg total) by mouth every 6 (six) hours as needed (Nausea or vomiting). (Patient not taking: Reported on 08/30/2019)  . [EXPIRED] sodium chloride flush (NS) 0.9 % injection 10 mL    No facility-administered encounter medications on file as of 08/30/2019.    ALLERGIES:  Allergies  Allergen Reactions  . Sulfamethoxazole-Trimethoprim Other (See Comments)    Headaches  . Penicillins Rash    20 years     PHYSICAL EXAM:  ECOG Performance status: 1  Vitals:   08/30/19 0857  BP: (!) 156/81  Pulse: 70  Resp: 18  Temp: (!) 97.5 F (36.4 C)  SpO2: 97%   Filed Weights   08/30/19 0857  Weight: 212 lb 9.6 oz (96.4 kg)   Physical Exam Constitutional:      Appearance: Normal appearance. She is normal weight.  Cardiovascular:     Rate and Rhythm: Normal rate and regular rhythm.     Heart sounds: Normal heart sounds.  Pulmonary:     Effort: Pulmonary effort is normal.      Breath sounds: Normal breath sounds.  Abdominal:     General: Bowel sounds are normal.     Palpations: Abdomen is soft.  Musculoskeletal:        General: Normal range of motion.  Skin:    General: Skin is warm.  Neurological:     Mental Status: She is alert and oriented to person, place, and time. Mental status is at baseline.  Psychiatric:        Mood and Affect: Mood normal.        Behavior: Behavior normal.        Thought Content: Thought content normal.        Judgment: Judgment normal.      LABORATORY DATA:  I have reviewed the labs as listed.  CBC    Component Value Date/Time   WBC 6.3 08/30/2019 0916   RBC 4.17 08/30/2019 0916   HGB 12.6 08/30/2019 0916   HCT 38.8 08/30/2019 0916   PLT 228 08/30/2019 0916   MCV 93.0 08/30/2019 0916   MCH 30.2 08/30/2019 0916   MCHC 32.5 08/30/2019 0916   RDW 13.2 08/30/2019 0916   LYMPHSABS 1.2 08/30/2019 0916   MONOABS 0.5 08/30/2019 0916   EOSABS 0.1 08/30/2019 0916   BASOSABS 0.0 08/30/2019 0916   CMP Latest Ref Rng & Units 08/30/2019 08/23/2019 08/16/2019  Glucose 70 - 99 mg/dL 101(H) 135(H) 114(H)  BUN 8 - 23 mg/dL 12 15 13   Creatinine 0.44 - 1.00 mg/dL 0.73 0.74 0.79  Sodium 135 - 145 mmol/L 137 137 139  Potassium 3.5 - 5.1 mmol/L 4.3 4.1 4.0  Chloride 98 - 111 mmol/L 99 99 102  CO2 22 - 32 mmol/L 28 28 27   Calcium 8.9 - 10.3 mg/dL 8.8(L) 8.9 8.8(L)  Total Protein 6.5 - 8.1 g/dL 6.8 6.9 6.9  Total Bilirubin 0.3 - 1.2 mg/dL 0.8 0.7 0.8  Alkaline Phos 38 - 126 U/L 94 94 86  AST 15 - 41 U/L 18 19 18   ALT 0 - 44 U/L 24 34 18   All questions were answered to patient's stated satisfaction. Encouraged patient to call with any new concerns or questions before his next visit to  the cancer center and we can certain see him sooner, if needed.     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  Squamous cell carcinoma, p16 positive of the left tonsil: -Biopsy on 06/29/2019 showed squamous cell carcinoma,  p16 positive -PET scan on 07/18/2019 demonstrated a 2.2 cm mass with SUV of 16.2 with small 8 mm lymph node. -An ultrasound of the thyroid gland showed heterogeneous thyroid with no masses. -She was evaluated by Dr. Francesca Jewett. -RXT with weekly cisplatin started on 08/16/2019. -She will proceed with weekly cisplatin with pre and post hydration.  She is encouraged to drink 2 to 3 L of water daily. -She has a slight baseline hearing loss in the left side with ringing in the ear.  We will keep a close eye on it. -Labs done on 08/30/2019 showed CBC normal.  Creatinine was 0.73, liver enzymes were WNL electrolytes were normal. -She will call us if she needs fluids on Thursday. -She will see [place] back in 1 week with her next cycle.  2.  Hypothyroidism: -Continue Synthroid 50 mcg daily  3.  Anxiety: -She will call us if anxiety gets worse. -We will prescribe Xanax.  4.  Headaches: -At her last visit she was prescribed Fiorcet   5.  Mouth sores: -She was prescribed fluconazole for 7 days. -She is also rinsing her mouth out multiple times a day.     Orders placed this encounter:  Orders Placed This Encounter  Procedures  . Lactate dehydrogenase  . Magnesium  . CBC with Differential/Platelet  . Comprehensive metabolic panel      Francene Finders, FNP-C [facility] [phone]

## 2019-08-30 NOTE — Patient Instructions (Signed)
[facility] at [facility] Discharge Instructions  Follow up in 1 week with labs and treatment.   Thank you for choosing [facility] at [facility] to provide your oncology and hematology care.  To afford each patient quality time with our provider, please arrive at least 15 minutes before your scheduled appointment time.   If you have a lab appointment with the Cancer Center please come in thru the Main Entrance and check in at the main information desk.  You need to re-schedule your appointment should you arrive 10 or more minutes late.  We strive to give you quality time with our providers, and arriving late affects you and other patients whose appointments are after yours.  Also, if you no show three or more times for appointments you may be dismissed from the clinic at the providers discretion.     Again, thank you for choosing [facility].  Our hope is that these requests will decrease the amount of time that you wait before being seen by our physicians.       _____________________________________________________________  Should you have questions after your visit to [facility], please contact our office at [phone] between the hours of 8:00 a.m. and 4:30 p.m.  Voicemails left after 4:00 p.m. will not be returned until the following business day.  For prescription refill requests, have your pharmacy contact our office and allow 72 hours.    Due to Covid, you will need to wear a mask upon entering the hospital. If you do not have a mask, a mask will be given to you at the Main Entrance upon arrival. For doctor visits, patients may have 1 support person with them. For treatment visits, patients can not have anyone with them due to social distancing guidelines and our immunocompromised population.      

## 2019-08-30 NOTE — Assessment & Plan Note (Addendum)
1.  Squamous cell carcinoma, p16 positive of the left tonsil: -Biopsy on 06/29/2019 showed squamous cell carcinoma, p16 positive -PET scan on 07/18/2019 demonstrated a 2.2 cm mass with SUV of 16.2 with small 8 mm lymph node. -An ultrasound of the thyroid gland showed heterogeneous thyroid with no masses. -She was evaluated by Dr. Francesca Jewett. -RXT with weekly cisplatin started on 08/16/2019. -She will proceed with weekly cisplatin with pre and post hydration.  She is encouraged to drink 2 to 3 L of water daily. -She has a slight baseline hearing loss in the left side with ringing in the ear.  We will keep a close eye on it. -Labs done on 08/30/2019 showed CBC normal.  Creatinine was 0.73, liver enzymes were WNL electrolytes were normal. -She will call us if she needs fluids on Thursday. -She will see [place] back in 1 week with her next cycle.  2.  Hypothyroidism: -Continue Synthroid 50 mcg daily  3.  Anxiety: -She will call us if anxiety gets worse. -We will prescribe Xanax.  4.  Headaches: -At her last visit she was prescribed Fiorcet   5.  Mouth sores: -She was prescribed fluconazole for 7 days. -She is also rinsing her mouth out multiple times a day.

## 2019-08-30 NOTE — Patient Instructions (Signed)
[facility] °Discharge Instructions for Patients Receiving Chemotherapy ° ° °Beginning January 23rd 2017 lab work for the Cancer Center will be done in the  °Main lab at [place] on 1st floor. °If you have a lab appointment with the Cancer Center please come in thru the  °Main Entrance and check in at the main information desk ° ° °Today you received the following chemotherapy agents Cisplatin. Follow-up as scheduled ° °To help prevent nausea and vomiting after your treatment, we encourage you to take your nausea medication °  °If you develop nausea and vomiting, or diarrhea that is not controlled by your medication, call the clinic. ° °The clinic phone number is [phone]. Office hours are Monday-Friday 8:30am-5:00pm. ° °BELOW ARE SYMPTOMS THAT SHOULD BE REPORTED IMMEDIATELY: °· *FEVER GREATER THAN 101.0 F °· *CHILLS WITH OR WITHOUT FEVER °· NAUSEA AND VOMITING THAT IS NOT CONTROLLED WITH YOUR NAUSEA MEDICATION °· *UNUSUAL SHORTNESS OF BREATH °· *UNUSUAL BRUISING OR BLEEDING °· TENDERNESS IN MOUTH AND THROAT WITH OR WITHOUT PRESENCE OF ULCERS °· *URINARY PROBLEMS °· *BOWEL PROBLEMS °· UNUSUAL RASH °Items with * indicate a potential emergency and should be followed up as soon as possible. °If you have an emergency after office hours please contact your primary care physician or go to the nearest emergency department. ° °Please call the clinic during office hours if you have any questions or concerns.  ° °You may also contact the Patient Navigator at [phone] should you have any questions or need assistance in obtaining follow up care. ° ° ° ° ° °Resources For Cancer Patients and their Caregivers °? American [employer] Society: °Can assist with transportation, wigs, general needs, runs Look Good Feel Better.        °1-888-227-6333 °? Cancer Care: °Provides financial assistance, online support groups, medication/co-pay assistance.  °1-800-813-HOPE (4673) °? [facility] °Assists [place] cancer patients and their families through emotional , educational and financial support.  336-427-4357 °? [place] DSS °Where to apply for food stamps, Medicaid and utility assistance. °336-342-1394 °? [facility]: °Transportation to medical appointments. 336-347-2287 °? Social Security Administration: °May apply for disability if have a Stage IV cancer. 336-342-7796 1-800-772-1213 °? [place] [facility]: °Assists with nutrition, care and transit needs. 336-349-2343 °  ° ° ° ° ° ° °

## 2019-08-31 ENCOUNTER — Inpatient Hospital Stay (HOSPITAL_COMMUNITY): Payer: PPO | Attending: Hematology

## 2019-08-31 ENCOUNTER — Inpatient Hospital Stay (HOSPITAL_COMMUNITY): Payer: PPO

## 2019-08-31 ENCOUNTER — Encounter (HOSPITAL_COMMUNITY): Payer: Self-pay

## 2019-08-31 VITALS — BP 138/62 | HR 81 | Temp 98.2°F | Resp 18

## 2019-08-31 DIAGNOSIS — E039 Hypothyroidism, unspecified: Secondary | ICD-10-CM | POA: Diagnosis not present

## 2019-08-31 DIAGNOSIS — Z79899 Other long term (current) drug therapy: Secondary | ICD-10-CM | POA: Diagnosis not present

## 2019-08-31 DIAGNOSIS — C099 Malignant neoplasm of tonsil, unspecified: Secondary | ICD-10-CM | POA: Diagnosis not present

## 2019-08-31 DIAGNOSIS — Z5111 Encounter for antineoplastic chemotherapy: Secondary | ICD-10-CM | POA: Insufficient documentation

## 2019-08-31 DIAGNOSIS — C090 Malignant neoplasm of tonsillar fossa: Secondary | ICD-10-CM | POA: Diagnosis not present

## 2019-08-31 DIAGNOSIS — E860 Dehydration: Secondary | ICD-10-CM

## 2019-08-31 MED ORDER — ONDANSETRON HCL 4 MG/2ML IJ SOLN
8.0000 mg | Freq: Once | INTRAMUSCULAR | Status: DC
  Filled 2019-08-31: qty 4

## 2019-08-31 MED ORDER — SODIUM CHLORIDE 0.9% FLUSH: 10.0000 mL | Freq: Once | INTRAVENOUS | Status: DC | PRN

## 2019-08-31 MED ORDER — SODIUM CHLORIDE 0.9 % IV SOLN
Freq: Once | INTRAVENOUS | Status: AC
  Administered 2019-08-31: 8 mg via INTRAVENOUS
  Filled 2019-08-31: qty 4

## 2019-08-31 MED ORDER — HEPARIN SOD (PORK) LOCK FLUSH 100 UNIT/ML IV SOLN: 500.0000 [IU] | Freq: Once | INTRAVENOUS | Status: DC | PRN

## 2019-08-31 MED ORDER — SODIUM CHLORIDE 0.9 % IV SOLN
Freq: Once | INTRAVENOUS | Status: AC
  Filled 2019-08-31: qty 1000

## 2019-08-31 NOTE — Progress Notes (Signed)
Pt here for fluids.  VS stable.  Pt is having nausea this am.  Notified Francene Finders NP, orders received.  Zofran 8mg  given.   Tolerated IV fluids.  Discharged ambulatory.

## 2019-08-31 NOTE — Patient Instructions (Signed)
[facility] at [facility]  Discharge Instructions:   _______________________________________________________________  Thank you for choosing [facility] at [facility] to provide your oncology and hematology care.  To afford each patient quality time with our providers, please arrive at least 15 minutes before your scheduled appointment.  You need to re-schedule your appointment if you arrive 10 or more minutes late.  We strive to give you quality time with our providers, and arriving late affects you and other patients whose appointments are after yours.  Also, if you no show three or more times for appointments you may be dismissed from the clinic.  Again, thank you for choosing [facility] at [facility]. Our hope is that these requests will allow you access to exceptional care and in a timely manner. _______________________________________________________________  If you have questions after your visit, please contact our office at [phone] between the hours of 8:30 a.m. and 5:00 p.m. Voicemails left after 4:30 p.m. will not be returned until the following business day. _______________________________________________________________  For prescription refill requests, have your pharmacy contact our office. _______________________________________________________________  Recommendations made by the consultant and any test results will be sent to your referring physician. _______________________________________________________________ 

## 2019-09-01 ENCOUNTER — Ambulatory Visit (HOSPITAL_COMMUNITY): Payer: PPO

## 2019-09-01 DIAGNOSIS — C090 Malignant neoplasm of tonsillar fossa: Secondary | ICD-10-CM | POA: Diagnosis not present

## 2019-09-01 NOTE — Progress Notes (Signed)
Nutrition Follow-up:   Patient with left tonsillar cancer, p 16 positive.  Patient has started chemotherapy and radiation therapy.    Spoke with patient via phone for nutrition follow-up.  Patient reports that this week she has had more mouth pain, blisters in mouth. Has been taking ibuprfen for pain.  Planning to try oral nutrition samples that RD gave her today.  Has been eating egg drop soup recently.  Reports taste have changed and food taste "flat". Can't taste salt in foods but can taste sweet things.      Medications: magic mouthwash, diflucan  Labs: reviewed  Anthropometrics:   Weight 212 lb 14.4 on 6/23 decreased from 213 lb on 5/27 (UBW)   NUTRITION DIAGNOSIS: Predicted suboptimal energy intake continues   INTERVENTION:  Discussed strategies to help with sore mouth.  Will mail handouts and recipes for shakes Patient has contact information    MONITORING, EVALUATION, GOAL: weight trends, intake   NEXT VISIT: July 16, phone f/u  Kristi Weiss B. Zenia Resides, [place], [place] Registered Dietitian [phone] (pager)

## 2019-09-05 DIAGNOSIS — C090 Malignant neoplasm of tonsillar fossa: Secondary | ICD-10-CM | POA: Diagnosis not present

## 2019-09-06 ENCOUNTER — Inpatient Hospital Stay (HOSPITAL_COMMUNITY): Payer: PPO

## 2019-09-06 ENCOUNTER — Inpatient Hospital Stay (HOSPITAL_COMMUNITY): Payer: PPO | Admitting: Hematology

## 2019-09-06 ENCOUNTER — Other Ambulatory Visit: Payer: Self-pay

## 2019-09-06 VITALS — BP 132/54 | HR 67 | Temp 96.9°F | Resp 18

## 2019-09-06 DIAGNOSIS — C090 Malignant neoplasm of tonsillar fossa: Secondary | ICD-10-CM | POA: Diagnosis not present

## 2019-09-06 DIAGNOSIS — C099 Malignant neoplasm of tonsil, unspecified: Secondary | ICD-10-CM

## 2019-09-06 DIAGNOSIS — Z95828 Presence of other vascular implants and grafts: Secondary | ICD-10-CM

## 2019-09-06 DIAGNOSIS — Z5111 Encounter for antineoplastic chemotherapy: Secondary | ICD-10-CM | POA: Diagnosis not present

## 2019-09-06 LAB — COMPREHENSIVE METABOLIC PANEL
ALT: 24 U/L (ref 0–44)
AST: 20 U/L (ref 15–41)
Albumin: 3.9 g/dL (ref 3.5–5.0)
Alkaline Phosphatase: 91 U/L (ref 38–126)
Anion gap: 9 (ref 5–15)
BUN: 11 mg/dL (ref 8–23)
CO2: 27 mmol/L (ref 22–32)
Calcium: 8.7 mg/dL — ABNORMAL LOW (ref 8.9–10.3)
Chloride: 101 mmol/L (ref 98–111)
Creatinine, Ser: 0.79 mg/dL (ref 0.44–1.00)
GFR calc Af Amer: >60 mL/min (ref >60)
GFR calc non Af Amer: >60 mL/min (ref >60)
Glucose, Bld: 125 mg/dL — ABNORMAL HIGH (ref 70–99)
Potassium: 4.2 mmol/L (ref 3.5–5.1)
Sodium: 137 mmol/L (ref 135–145)
Total Bilirubin: 0.7 mg/dL (ref 0.3–1.2)
Total Protein: 6.5 g/dL (ref 6.5–8.1)

## 2019-09-06 LAB — CBC WITH DIFFERENTIAL/PLATELET
Abs Immature Granulocytes: 0.01 10*3/uL (ref 0.00–0.07)
Basophils Absolute: 0 10*3/uL (ref 0.0–0.1)
Basophils Relative: 1 %
Eosinophils Absolute: 0.1 10*3/uL (ref 0.0–0.5)
Eosinophils Relative: 1 %
HCT: 37.9 % (ref 36.0–46.0)
Hemoglobin: 12.2 g/dL (ref 12.0–15.0)
Immature Granulocytes: 0 %
Lymphocytes Relative: 18 %
Lymphs Abs: 0.9 10*3/uL (ref 0.7–4.0)
MCH: 29.8 pg (ref 26.0–34.0)
MCHC: 32.2 g/dL (ref 30.0–36.0)
MCV: 92.4 fL (ref 80.0–100.0)
Monocytes Absolute: 0.4 10*3/uL (ref 0.1–1.0)
Monocytes Relative: 9 %
Neutro Abs: 3.6 10*3/uL (ref 1.7–7.7)
Neutrophils Relative %: 71 %
Platelets: 241 10*3/uL (ref 150–400)
RBC: 4.1 MIL/uL (ref 3.87–5.11)
RDW: 12.8 % (ref 11.5–15.5)
WBC: 5 10*3/uL (ref 4.0–10.5)
nRBC: 0 % (ref 0.0–0.2)

## 2019-09-06 LAB — LACTATE DEHYDROGENASE: LDH: 152 U/L (ref 98–192)

## 2019-09-06 LAB — MAGNESIUM: Magnesium: 1.9 mg/dL (ref 1.7–2.4)

## 2019-09-06 MED ORDER — SODIUM CHLORIDE 0.9 % IV SOLN: Freq: Once | INTRAVENOUS | Status: AC

## 2019-09-06 MED ORDER — PALONOSETRON HCL INJECTION 0.25 MG/5ML
INTRAVENOUS | Status: AC
  Filled 2019-09-06: qty 5

## 2019-09-06 MED ORDER — HEPARIN SOD (PORK) LOCK FLUSH 100 UNIT/ML IV SOLN
500.0000 [IU] | Freq: Once | INTRAVENOUS | Status: AC | PRN
  Administered 2019-09-06: 500 [IU]

## 2019-09-06 MED ORDER — POTASSIUM CHLORIDE 2 MEQ/ML IV SOLN
Freq: Once | INTRAVENOUS | Status: AC
  Filled 2019-09-06: qty 10

## 2019-09-06 MED ORDER — SODIUM CHLORIDE 0.9 % IV SOLN
40.0000 mg/m2 | Freq: Once | INTRAVENOUS | Status: AC
  Administered 2019-09-06: 84 mg via INTRAVENOUS
  Filled 2019-09-06: qty 84

## 2019-09-06 MED ORDER — SODIUM CHLORIDE 0.9 % IV SOLN
10.0000 mg | Freq: Once | INTRAVENOUS | Status: AC
  Administered 2019-09-06: 10 mg via INTRAVENOUS
  Filled 2019-09-06: qty 10

## 2019-09-06 MED ORDER — SODIUM CHLORIDE 0.9 % IV SOLN
150.0000 mg | Freq: Once | INTRAVENOUS | Status: AC
  Administered 2019-09-06: 150 mg via INTRAVENOUS
  Filled 2019-09-06: qty 150

## 2019-09-06 MED ORDER — PALONOSETRON HCL INJECTION 0.25 MG/5ML
0.2500 mg | Freq: Once | INTRAVENOUS | Status: AC
  Administered 2019-09-06: 0.25 mg via INTRAVENOUS

## 2019-09-06 NOTE — Progress Notes (Signed)
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[place])   07/26/2019 Cancer Staging   Staging form: Pharynx - HPV-Mediated Oropharynx, AJCC 8th Edition - Clinical stage from 07/26/2019: Stage I (cT2, cN1, cM0, p16+) - Signed by Derek Jack, MD on 07/26/2019   08/16/2019 -  Chemotherapy   The patient had palonosetron (ALOXI) injection 0.25 mg, 0.25 mg, Intravenous,  Once, 3 of 7 cycles Administration: 0.25 mg (08/16/2019), 0.25 mg (08/23/2019), 0.25 mg (08/30/2019) CISplatin (PLATINOL) 84 mg in sodium chloride 0.9 % 250 mL chemo infusion, 40 mg/m2 = 84 mg, Intravenous,  Once, 3 of 7 cycles Administration: 84 mg (08/16/2019), 84 mg (08/23/2019), 84 mg (08/30/2019) fosaprepitant (EMEND) 150 mg in sodium chloride 0.9 % 145 mL IVPB, 150 mg, Intravenous,  Once, 3 of 7 cycles Administration: 150 mg (08/16/2019), 150 mg (08/23/2019), 150 mg (08/30/2019)  for chemotherapy treatment.      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, a 74 y.o. female, returns for routine follow-up and consideration for next cycle of chemotherapy. Kristi Weiss was last seen on 08/16/2019.  Due for cycle #4 of cisplatin and Aloxi today.   Overall, she tells me she has been feeling pretty well. She reports she has been using a Magic mouth rinse  with lidocaine for her painful swallowing. She took diflucan for 6 days for her oral thrush. She denies numbness/tingling, and her tinnitus is at baseline. She reports having very mild tremors in her hands. She is eating soups and 1 shake per day whenever she feels like eating. She reports that the lack of taste is her main concern. She is trying to drink 8-10 bottles of water daily.  She usually receives fluids the day after her treatment as well.   Overall, she feels ready for next cycle of chemo today.    REVIEW OF SYSTEMS:  Review of Systems  Constitutional: Positive for appetite change (severely decreased) and fatigue (severe).  HENT:   Positive for trouble swallowing (2/10 mouth pain with swallowing).   Gastrointestinal: Negative for nausea and vomiting.  Neurological: Positive for headaches. Negative for numbness.  Psychiatric/Behavioral: The patient is nervous/anxious.   All other systems reviewed and are negative.   PAST MEDICAL/SURGICAL HISTORY:  Past Medical History:  Diagnosis Date  . Anxiety about health 08/23/2019  . Arthritis   . GERD (gastroesophageal reflux disease)   . Hypothyroidism   . Hypothyroidism (acquired) 08/23/2019  . 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 Port-A-Cath in place 08/09/2019  . 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 Scoliosis   . Sinusitis   . 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   Past Surgical History:  Procedure  Laterality Date  . NO PAST SURGERIES    . No prior surgery    . PORTACATH PLACEMENT Right 08/01/2019   Procedure: INSERTION PORT-A-CATH;  Surgeon: Aviva Signs, MD;  Location: [facility];  Service: General;  Laterality: Right;    SOCIAL HISTORY:  Social History   Socioeconomic History  . Marital status: Widowed    Spouse name: Not on file  .  Number of children: 1  . Years of education: Not on file  . Highest education level: Not on file  Occupational History  . Occupation: RETIRED  Tobacco Use  . Smoking status: Former Research scientist (life sciences)  . Smokeless tobacco: Never Used  . Tobacco comment: some in her 74s and 30s  Vaping Use  . Vaping Use: Never used  Substance and Sexual Activity  . Alcohol use: Yes    Comment: on rare occasion "like twice a year"  . Drug use: Never  . Sexual activity: Not Currently  Other Topics Concern  . Not on file  Social History Narrative   Lives at home alone   Retired   Widow   Caffeine: about 4 cups coffee, 3 glasses of tea daily   Social Determinants of Health   Financial Resource Strain: Low Risk   . Difficulty of Paying Living Expenses: Not hard at all  Food Insecurity: No Food Insecurity  . Worried About Charity fundraiser in the Last Year: Never true  . Ran Out of Food in the Last Year: Never true  Transportation Needs: No Transportation Needs  . Lack of Transportation (Medical): No  . Lack of Transportation (Non-Medical): No  Physical Activity: Sufficiently Active  . Days of Exercise per Week: 5 days  . Minutes of Exercise per Session: 30 min  Stress: Stress Concern Present  . Feeling of Stress : Very much  Social Connections:   . Frequency of Communication with Friends and Family:   . Frequency of Social Gatherings with Friends and Family:   . Attends Religious Services:   . Active Member of Clubs or Organizations:   . Attends Archivist Meetings:   Marland Kitchen Marital Status:   Intimate Partner Violence: Not At Risk  . Fear of Current or Ex-Partner: No  . Emotionally Abused: No  . Physically Abused: No  . Sexually Abused: No    FAMILY HISTORY:  Family History  Problem Relation Age of Onset  . Lung cancer Mother        smoker  . Cancer Mother        Throat  . Lung cancer Father        smoker  . Diabetes Father   . CVA Brother   . Diabetes Brother   . Diabetes  Maternal Grandmother   . Heart attack Paternal Grandfather   . Migraines Neg Hx   . Headache Neg Hx     CURRENT MEDICATIONS:  Current Outpatient Medications  Medication Sig Dispense Refill  . CISplatin in sodium chloride 0.9 % 250 mL Inject 40 mg/m2 into the vein once a week.    . esomeprazole (NEXIUM) 40 MG capsule Take 40 mg by mouth daily.     . fluconazole (DIFLUCAN) 100 MG tablet Take by mouth.    . levothyroxine (SYNTHROID) 50 MCG tablet Take 50 mcg by mouth daily. BRAND    . PREVIDENT 5000 BOOSTER PLUS 1.1 % PSTE APPLY WITH TOOTHBRUSH AT BEDTIME    . prochlorperazine (COMPAZINE) 10 MG tablet Take 1 tablet (10 mg total) by mouth every  6 (six) hours as needed (Nausea or vomiting). 30 tablet 1  . butalbital-acetaminophen-caffeine (FIORICET) 50-325-40 MG tablet Take 1-2 tablets by mouth every 6 (six) hours as needed for headache (severe). (Patient not taking: Reported on 09/06/2019) 10 tablet 0  . diphenhydrAMINE (BENADRYL) 25 mg capsule Take 25 mg by mouth at bedtime as needed.  (Patient not taking: Reported on 09/06/2019)    . ibuprofen (ADVIL) 200 MG tablet Take 400-600 mg by mouth every 8 (eight) hours as needed for moderate pain.  (Patient not taking: Reported on 09/06/2019)    . lidocaine (XYLOCAINE) 2 % solution 10 mLs every 4 (four) hours as needed. (Patient not taking: Reported on 09/06/2019)    . lidocaine-prilocaine (EMLA) cream Apply a small amount to port a cath site and cover with plastic wrap 1 hour prior to chemotherapy appointments (Patient not taking: Reported on 09/06/2019) 30 g 3   No current facility-administered medications for this visit.    ALLERGIES:  Allergies  Allergen Reactions  . Sulfamethoxazole-Trimethoprim Other (See Comments)    Headaches  . Penicillins Rash    20 years    PHYSICAL EXAM:  Performance status (ECOG): 1 - Symptomatic but completely ambulatory  Vitals:   09/06/19 0848  BP: 132/63  Pulse: 85  Resp: 18  Temp: 98.3 F (36.8 C)  SpO2: 97%     Wt Readings from Last 3 Encounters:  09/06/19 93.8 kg (206 lb 12.8 oz)  08/30/19 96.4 kg (212 lb 9.6 oz)  08/23/19 96.6 kg (212 lb 14.4 oz)   Physical Exam Vitals reviewed.  Constitutional:      Appearance: Normal appearance. She is obese.  HENT:     Mouth/Throat:     Mouth: Oral lesions (thrush) present.  Cardiovascular:     Rate and Rhythm: Normal rate and regular rhythm.     Pulses: Normal pulses.     Heart sounds: Normal heart sounds.  Pulmonary:     Effort: Pulmonary effort is normal.     Breath sounds: Normal breath sounds.  Neurological:     General: No focal deficit present.     Mental Status: She is alert and oriented to person, place, and time.  Psychiatric:        Mood and Affect: Mood normal.        Behavior: Behavior normal.     LABORATORY DATA:  I have reviewed the labs as listed.  CBC Latest Ref Rng & Units 09/06/2019 08/30/2019 08/23/2019  WBC 4.0 - 10.5 K/uL 5.0 6.3 7.3  Hemoglobin 12.0 - 15.0 g/dL 12.2 12.6 13.5  Hematocrit 36 - 46 % 37.9 38.8 41.3  Platelets 150 - 400 K/uL 241 228 272   CMP Latest Ref Rng & Units 09/06/2019 08/30/2019 08/23/2019  Glucose 70 - 99 mg/dL 125(H) 101(H) 135(H)  BUN 8 - 23 mg/dL 11 12 15   Creatinine 0.44 - 1.00 mg/dL 0.79 0.73 0.74  Sodium 135 - 145 mmol/L 137 137 137  Potassium 3.5 - 5.1 mmol/L 4.2 4.3 4.1  Chloride 98 - 111 mmol/L 101 99 99  CO2 22 - 32 mmol/L 27 28 28   Calcium 8.9 - 10.3 mg/dL 8.7(L) 8.8(L) 8.9  Total Protein 6.5 - 8.1 g/dL 6.5 6.8 6.9  Total Bilirubin 0.3 - 1.2 mg/dL 0.7 0.8 0.7  Alkaline Phos 38 - 126 U/L 91 94 94  AST 15 - 41 U/L 20 18 19   ALT 0 - 44 U/L 24 24 34    DIAGNOSTIC IMAGING:  I have independently reviewed  the scans and discussed with the patient. No results found.   ASSESSMENT:  1.Squamous cell carcinoma, p16 positiveof the left tonsil: - 06/29/2019 Biopsy A. LYMPH NODE, LEFT NECK, NEEDLE CORE BIOPSY:  - Squamous cell carcinoma, p16 positive.  COMMENT:  The carcinoma is  positive with p16, p40, cytokeratin 5/6 and p63 consistent with squamous cell carcinoma. The carcinoma is negative with Epstein-Barr virus (EBV), TTF-1 and Napsin-A. -PET scan on 07/18/2019 showed left level 2A lymph node, 1 cm. Small rounded lymph node 8 mm at level 2B on the left side. Intense palatine tonsillar uptake with asymmetric fullness of tonsillar tissue and symmetrical FDG activity with masslike changes on the left side measuring 2.2 x 1.7 cm compared to the right. SUV 16.2. Small focus of increased activity in the right neck adjacent to or within the small nodule in the right hemithyroid. -An ultrasound of the thyroid gland showed heterogeneous thyroid with no masses. -She was evaluated by Dr.Yanagihara. -XRT with weekly cisplatin started on 08/16/2019.   PLAN:  1. 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 -I have reviewed her labs.  LFTs and CBC are within normal limits.  Creatinine was 0.79. -She will proceed with her next cycle of chemotherapy without any dose modifications. -I will reevaluate her in 1 week.  She will come back tomorrow for fluids.  2. Hypothyroidism: -Continue Synthroid 50 mcg daily.  3.  Pain in the mouth: -She will use Magic mouthwash with lidocaine.    Orders placed this encounter:  No orders of the defined types were placed in this encounter.    Derek Jack, MD [place] [phone]   I, Milinda Antis, am acting as a scribe for Dr. Sanda Linger.  I, Derek Jack MD, have reviewed the above documentation for accuracy and completeness, and I agree with the above.

## 2019-09-06 NOTE — Patient Instructions (Signed)
[place] at [facility] Discharge Instructions  You were seen today by Dr. Delton Coombes. He went over your recent results. You received your treatment today. Continue using the mouth rinse daily. Dr. Delton Coombes will see you back in 1 week for labs and follow up.   Thank you for choosing [place] at [facility] to provide your oncology and hematology care.  To afford each patient quality time with our provider, please arrive at least 15 minutes before your scheduled appointment time.   If you have a lab appointment with the [facility] please come in thru the Main Entrance and check in at the main information desk  You need to re-schedule your appointment should you arrive 10 or more minutes late.  We strive to give you quality time with our providers, and arriving late affects you and other patients whose appointments are after yours.  Also, if you no show three or more times for appointments you may be dismissed from the clinic at the providers discretion.     Again, thank you for choosing [facility].  Our hope is that these requests will decrease the amount of time that you wait before being seen by our physicians.       _____________________________________________________________  Should you have questions after your visit to [facility], please contact our office at [phone] between the hours of 8:00 a.m. and 4:30 p.m.  Voicemails left after 4:00 p.m. will not be returned until the following business day.  For prescription refill requests, have your pharmacy contact our office and allow 72 hours.    Cancer Center Support Programs:   > Cancer Support Group  2nd Tuesday of the month 1pm-2pm, Journey Room

## 2019-09-06 NOTE — Progress Notes (Signed)
Tolerated infusions w/o adverse reaction.  Alert, in no distress.  VSS.  Discharged ambulatory.  

## 2019-09-06 NOTE — Progress Notes (Signed)
       Patient voided 360mls

## 2019-09-06 NOTE — Progress Notes (Signed)
Patient has been assessed, vital signs and labs have been reviewed by Dr. Katragadda. ANC, Creatinine, LFTs, and Platelets are within treatment parameters per Dr. Katragadda. The patient is good to proceed with treatment at this time.  

## 2019-09-07 ENCOUNTER — Inpatient Hospital Stay (HOSPITAL_COMMUNITY): Payer: PPO

## 2019-09-07 ENCOUNTER — Encounter (HOSPITAL_COMMUNITY): Payer: Self-pay

## 2019-09-07 VITALS — BP 148/76 | HR 74 | Temp 97.6°F | Resp 18

## 2019-09-07 DIAGNOSIS — C099 Malignant neoplasm of tonsil, unspecified: Secondary | ICD-10-CM

## 2019-09-07 DIAGNOSIS — C090 Malignant neoplasm of tonsillar fossa: Secondary | ICD-10-CM | POA: Diagnosis not present

## 2019-09-07 DIAGNOSIS — E860 Dehydration: Secondary | ICD-10-CM

## 2019-09-07 DIAGNOSIS — Z5111 Encounter for antineoplastic chemotherapy: Secondary | ICD-10-CM | POA: Diagnosis not present

## 2019-09-07 MED ORDER — SODIUM CHLORIDE 0.9 % IV SOLN
Freq: Once | INTRAVENOUS | Status: AC
  Filled 2019-09-07: qty 1000

## 2019-09-07 MED ORDER — HEPARIN SOD (PORK) LOCK FLUSH 100 UNIT/ML IV SOLN
500.0000 [IU] | Freq: Once | INTRAVENOUS | Status: AC | PRN
  Administered 2019-09-07: 500 [IU]

## 2019-09-07 MED ORDER — MAGIC MOUTHWASH: 5.0000 mL | Freq: Three times a day (TID) | ORAL | 6 refills | Status: DC | PRN

## 2019-09-07 MED ORDER — SODIUM CHLORIDE 0.9% FLUSH
10.0000 mL | Freq: Once | INTRAVENOUS | Status: AC | PRN
  Administered 2019-09-07: 10 mL

## 2019-09-07 MED ORDER — ONDANSETRON HCL 4 MG/2ML IJ SOLN: 8.0000 mg | Freq: Once | INTRAMUSCULAR | Status: DC

## 2019-09-07 MED ORDER — SODIUM CHLORIDE 0.9 % IV SOLN
Freq: Once | INTRAVENOUS | Status: AC
  Administered 2019-09-07: 8 mg via INTRAVENOUS
  Filled 2019-09-07: qty 4

## 2019-09-07 NOTE — Patient Instructions (Signed)
[facility] at [facility] Discharge Instructions  Received IV hydration with potassium and magnesium today. Follow-up as scheduled   Thank you for choosing [facility] at [facility] to provide your oncology and hematology care.  To afford each patient quality time with our provider, please arrive at least 15 minutes before your scheduled appointment time.   If you have a lab appointment with the Cancer Center please come in thru the Main Entrance and check in at the main information desk.  You need to re-schedule your appointment should you arrive 10 or more minutes late.  We strive to give you quality time with our providers, and arriving late affects you and other patients whose appointments are after yours.  Also, if you no show three or more times for appointments you may be dismissed from the clinic at the providers discretion.     Again, thank you for choosing [facility].  Our hope is that these requests will decrease the amount of time that you wait before being seen by our physicians.       _____________________________________________________________  Should you have questions after your visit to [facility], please contact our office at [phone] between the hours of 8:00 a.m. and 4:30 p.m.  Voicemails left after 4:00 p.m. will not be returned until the following business day.  For prescription refill requests, have your pharmacy contact our office and allow 72 hours.    Due to Covid, you will need to wear a mask upon entering the hospital. If you do not have a mask, a mask will be given to you at the Main Entrance upon arrival. For doctor visits, patients may have 1 support person with them. For treatment visits, patients can not have anyone with them due to social distancing guidelines and our immunocompromised population.     

## 2019-09-07 NOTE — Progress Notes (Signed)
Kristi Weiss tolerated IV hydration with magnesium and potassium well without incident. Pt c/o nausea and received Zofran IV as well per [facility] NP order.Pt discharged self ambulatory in satisfactory condition

## 2019-09-08 DIAGNOSIS — C090 Malignant neoplasm of tonsillar fossa: Secondary | ICD-10-CM | POA: Diagnosis not present

## 2019-09-11 DIAGNOSIS — C090 Malignant neoplasm of tonsillar fossa: Secondary | ICD-10-CM | POA: Diagnosis not present

## 2019-09-12 DIAGNOSIS — C090 Malignant neoplasm of tonsillar fossa: Secondary | ICD-10-CM | POA: Diagnosis not present

## 2019-09-13 ENCOUNTER — Inpatient Hospital Stay (HOSPITAL_COMMUNITY): Payer: PPO

## 2019-09-13 ENCOUNTER — Other Ambulatory Visit: Payer: Self-pay

## 2019-09-13 ENCOUNTER — Inpatient Hospital Stay (HOSPITAL_COMMUNITY): Payer: PPO | Admitting: Hematology

## 2019-09-13 ENCOUNTER — Encounter (HOSPITAL_COMMUNITY): Payer: Self-pay | Admitting: Hematology

## 2019-09-13 VITALS — BP 116/57 | HR 72 | Temp 98.5°F | Resp 17

## 2019-09-13 VITALS — BP 128/51 | HR 85 | Temp 98.2°F | Resp 18 | Wt 197.8 lb

## 2019-09-13 DIAGNOSIS — C099 Malignant neoplasm of tonsil, unspecified: Secondary | ICD-10-CM

## 2019-09-13 DIAGNOSIS — C090 Malignant neoplasm of tonsillar fossa: Secondary | ICD-10-CM | POA: Diagnosis not present

## 2019-09-13 DIAGNOSIS — Z5111 Encounter for antineoplastic chemotherapy: Secondary | ICD-10-CM | POA: Diagnosis not present

## 2019-09-13 DIAGNOSIS — Z95828 Presence of other vascular implants and grafts: Secondary | ICD-10-CM

## 2019-09-13 LAB — CBC WITH DIFFERENTIAL/PLATELET
Abs Immature Granulocytes: 0.01 10*3/uL (ref 0.00–0.07)
Basophils Absolute: 0 10*3/uL (ref 0.0–0.1)
Basophils Relative: 0 %
Eosinophils Absolute: 0 10*3/uL (ref 0.0–0.5)
Eosinophils Relative: 1 %
HCT: 35.6 % — ABNORMAL LOW (ref 36.0–46.0)
Hemoglobin: 11.6 g/dL — ABNORMAL LOW (ref 12.0–15.0)
Immature Granulocytes: 0 %
Lymphocytes Relative: 18 %
Lymphs Abs: 0.9 10*3/uL (ref 0.7–4.0)
MCH: 29.8 pg (ref 26.0–34.0)
MCHC: 32.6 g/dL (ref 30.0–36.0)
MCV: 91.5 fL (ref 80.0–100.0)
Monocytes Absolute: 0.5 10*3/uL (ref 0.1–1.0)
Monocytes Relative: 10 %
Neutro Abs: 3.5 10*3/uL (ref 1.7–7.7)
Neutrophils Relative %: 71 %
Platelets: 191 10*3/uL (ref 150–400)
RBC: 3.89 MIL/uL (ref 3.87–5.11)
RDW: 12.7 % (ref 11.5–15.5)
WBC: 4.9 10*3/uL (ref 4.0–10.5)
nRBC: 0 % (ref 0.0–0.2)

## 2019-09-13 LAB — COMPREHENSIVE METABOLIC PANEL
ALT: 25 U/L (ref 0–44)
AST: 20 U/L (ref 15–41)
Albumin: 4 g/dL (ref 3.5–5.0)
Alkaline Phosphatase: 85 U/L (ref 38–126)
Anion gap: 10 (ref 5–15)
BUN: 14 mg/dL (ref 8–23)
CO2: 28 mmol/L (ref 22–32)
Calcium: 8.8 mg/dL — ABNORMAL LOW (ref 8.9–10.3)
Chloride: 98 mmol/L (ref 98–111)
Creatinine, Ser: 0.81 mg/dL (ref 0.44–1.00)
GFR calc Af Amer: >60 mL/min (ref >60)
GFR calc non Af Amer: >60 mL/min (ref >60)
Glucose, Bld: 112 mg/dL — ABNORMAL HIGH (ref 70–99)
Potassium: 4.1 mmol/L (ref 3.5–5.1)
Sodium: 136 mmol/L (ref 135–145)
Total Bilirubin: 0.6 mg/dL (ref 0.3–1.2)
Total Protein: 6.5 g/dL (ref 6.5–8.1)

## 2019-09-13 LAB — MAGNESIUM: Magnesium: 1.7 mg/dL (ref 1.7–2.4)

## 2019-09-13 MED ORDER — POTASSIUM CHLORIDE 2 MEQ/ML IV SOLN
Freq: Once | INTRAVENOUS | Status: AC
  Filled 2019-09-13: qty 10

## 2019-09-13 MED ORDER — SODIUM CHLORIDE 0.9 % IV SOLN
10.0000 mg | Freq: Once | INTRAVENOUS | Status: AC
  Administered 2019-09-13: 10 mg via INTRAVENOUS
  Filled 2019-09-13: qty 1

## 2019-09-13 MED ORDER — SODIUM CHLORIDE 0.9 % IV SOLN: Freq: Once | INTRAVENOUS | Status: AC

## 2019-09-13 MED ORDER — PALONOSETRON HCL INJECTION 0.25 MG/5ML
INTRAVENOUS | Status: AC
  Filled 2019-09-13: qty 5

## 2019-09-13 MED ORDER — PALONOSETRON HCL INJECTION 0.25 MG/5ML
0.2500 mg | Freq: Once | INTRAVENOUS | Status: AC
  Administered 2019-09-13: 0.25 mg via INTRAVENOUS

## 2019-09-13 MED ORDER — SODIUM CHLORIDE 0.9 % IV SOLN
40.0000 mg/m2 | Freq: Once | INTRAVENOUS | Status: AC
  Administered 2019-09-13: 84 mg via INTRAVENOUS
  Filled 2019-09-13: qty 84

## 2019-09-13 MED ORDER — SODIUM CHLORIDE 0.9 % IV SOLN
150.0000 mg | Freq: Once | INTRAVENOUS | Status: AC
  Administered 2019-09-13: 150 mg via INTRAVENOUS
  Filled 2019-09-13: qty 5

## 2019-09-13 MED ORDER — SODIUM CHLORIDE 0.9% FLUSH
10.0000 mL | INTRAVENOUS | Status: DC | PRN
  Administered 2019-09-13: 10 mL

## 2019-09-13 MED ORDER — HEPARIN SOD (PORK) LOCK FLUSH 100 UNIT/ML IV SOLN
500.0000 [IU] | Freq: Once | INTRAVENOUS | Status: AC | PRN
  Administered 2019-09-13: 500 [IU]

## 2019-09-13 NOTE — Progress Notes (Signed)
Patient presents today for treatment and follow up visit with Dr. Delton Coombes. Labs pending. Patient states she is fatigued and has no appetite. Vital signs within parameters for treatment.   Message received from [facility] LPN/ Dr. Delton Coombes proceed with treatment. Patient to return tomorrow for fluids.   Patient voided 250 mls .  Treatment given today per MD orders. Tolerated infusion without adverse affects. Vital signs stable. No complaints at this time. Discharged from clinic ambulatory. F/U with [facility] as scheduled.

## 2019-09-13 NOTE — Patient Instructions (Addendum)
[place] at [facility] Discharge Instructions  You were seen today by Dr. Delton Coombes. He went over your recent results. You received your treatment today. Please use the Magic mouthwash every morning for your sore throat and drink about 5 cans of Boost Plus daily to bring up your weight. Dr. Delton Coombes will see you back in 1 week for labs and follow up.   Thank you for choosing [place] at [facility] to provide your oncology and hematology care.  To afford each patient quality time with our provider, please arrive at least 15 minutes before your scheduled appointment time.   If you have a lab appointment with the [facility] please come in thru the Main Entrance and check in at the main information desk  You need to re-schedule your appointment should you arrive 10 or more minutes late.  We strive to give you quality time with our providers, and arriving late affects you and other patients whose appointments are after yours.  Also, if you no show three or more times for appointments you may be dismissed from the clinic at the providers discretion.     Again, thank you for choosing [facility].  Our hope is that these requests will decrease the amount of time that you wait before being seen by our physicians.       _____________________________________________________________  Should you have questions after your visit to [facility], please contact our office at [phone] between the hours of 8:00 a.m. and 4:30 p.m.  Voicemails left after 4:00 p.m. will not be returned until the following business day.  For prescription refill requests, have your pharmacy contact our office and allow 72 hours.    Cancer Center Support Programs:   > Cancer Support Group  2nd Tuesday of the month 1pm-2pm, Journey Room

## 2019-09-13 NOTE — Progress Notes (Signed)
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[place])   07/26/2019 Cancer Staging   Staging form: Pharynx - HPV-Mediated Oropharynx, AJCC 8th Edition - Clinical stage from 07/26/2019: Stage I (cT2, cN1, cM0, p16+) - Signed by Derek Jack, MD on 07/26/2019   08/16/2019 -  Chemotherapy   The patient had palonosetron (ALOXI) injection 0.25 mg, 0.25 mg, Intravenous,  Once, 4 of 7 cycles Administration: 0.25 mg (08/16/2019), 0.25 mg (08/23/2019), 0.25 mg (08/30/2019), 0.25 mg (09/06/2019) CISplatin (PLATINOL) 84 mg in sodium chloride 0.9 % 250 mL chemo infusion, 40 mg/m2 = 84 mg, Intravenous,  Once, 4 of 7 cycles Administration: 84 mg (08/16/2019), 84 mg (08/23/2019), 84 mg (08/30/2019), 84 mg (09/06/2019) fosaprepitant (EMEND) 150 mg in sodium chloride 0.9 % 145 mL IVPB, 150 mg, Intravenous,  Once, 4 of 7 cycles Administration: 150 mg (08/16/2019), 150 mg (08/23/2019), 150 mg (08/30/2019), 150 mg (09/06/2019)  for chemotherapy treatment.      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 female, returns for routine follow-up and consideration for next cycle of chemotherapy. Kristi Weiss was last seen on 09/06/2019.  Due for cycle #5 of cisplatin and Aloxi today.   Overall, today she tells me she has been feeling  pretty well. She reports having trouble swallowing due to her sore throat. She has completed about 16/30 radiation treatments. She will get the Magic mouthwash today and she reports using another liquid without lidocaine when her throat is sore, especially in the mornings. She is not tolerating solid foods, but is drinking broth, vegetable soup, milkshakes, and 1 cans of Boost. Her appetite is not great due to the taste being off.   Overall, she feels ready for next cycle of chemo today.    REVIEW OF SYSTEMS:  Review of Systems  Constitutional: Positive for appetite change (depleted) and fatigue (depleted).  HENT:   Positive for sore throat and trouble swallowing (d/t sore throat). Negative for tinnitus.   Cardiovascular: Negative for leg swelling.  Neurological: Negative for numbness.  All other systems reviewed and are negative.   PAST MEDICAL/SURGICAL HISTORY:  Past Medical History:  Diagnosis Date  . Anxiety about health 08/23/2019  . Arthritis   . GERD (gastroesophageal reflux disease)   . Hypothyroidism   . Hypothyroidism (acquired) 08/23/2019  . 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 Port-A-Cath in place 08/09/2019  . 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 Scoliosis   . Sinusitis   . 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   Past Surgical History:  Procedure Laterality Date  . NO PAST SURGERIES    . No prior surgery    . PORTACATH  PLACEMENT Right 08/01/2019   Procedure: INSERTION PORT-A-CATH;  Surgeon: Aviva Signs, MD;  Location: [facility];  Service: General;  Laterality: Right;    SOCIAL HISTORY:  Social History   Socioeconomic History  . Marital status: Widowed    Spouse name: Not on file  . Number of children: 1  . Years of education: Not on file  . Highest education level: Not on file    Occupational History  . Occupation: RETIRED  Tobacco Use  . Smoking status: Former Research scientist (life sciences)  . Smokeless tobacco: Never Used  . Tobacco comment: some in her 52s and 30s  Vaping Use  . Vaping Use: Never used  Substance and Sexual Activity  . Alcohol use: Yes    Comment: on rare occasion "like twice a year"  . Drug use: Never  . Sexual activity: Not Currently  Other Topics Concern  . Not on file  Social History Narrative   Lives at home alone   Retired   Widow   Caffeine: about 4 cups coffee, 3 glasses of tea daily   Social Determinants of Health   Financial Resource Strain: Low Risk   . Difficulty of Paying Living Expenses: Not hard at all  Food Insecurity: No Food Insecurity  . Worried About Charity fundraiser in the Last Year: Never true  . Ran Out of Food in the Last Year: Never true  Transportation Needs: No Transportation Needs  . Lack of Transportation (Medical): No  . Lack of Transportation (Non-Medical): No  Physical Activity: Sufficiently Active  . Days of Exercise per Week: 5 days  . Minutes of Exercise per Session: 30 min  Stress: Stress Concern Present  . Feeling of Stress : Very much  Social Connections:   . Frequency of Communication with Friends and Family:   . Frequency of Social Gatherings with Friends and Family:   . Attends Religious Services:   . Active Member of Clubs or Organizations:   . Attends Archivist Meetings:   Marland Kitchen Marital Status:   Intimate Partner Violence: Not At Risk  . Fear of Current or Ex-Partner: No  . Emotionally Abused: No  . Physically Abused: No  . Sexually Abused: No    FAMILY HISTORY:  Family History  Problem Relation Age of Onset  . Lung cancer Mother        smoker  . Cancer Mother        Throat  . Lung cancer Father        smoker  . Diabetes Father   . CVA Brother   . Diabetes Brother   . Diabetes Maternal Grandmother   . Heart attack Paternal Grandfather   . Migraines Neg Hx   . Headache Neg Hx      CURRENT MEDICATIONS:  Current Outpatient Medications  Medication Sig Dispense Refill  . CISplatin in sodium chloride 0.9 % 250 mL Inject 40 mg/m2 into the vein once a week.    . esomeprazole (NEXIUM) 40 MG capsule Take 40 mg by mouth daily.     Marland Kitchen levothyroxine (SYNTHROID) 50 MCG tablet Take 50 mcg by mouth daily. BRAND    . PREVIDENT 5000 BOOSTER PLUS 1.1 % PSTE APPLY WITH TOOTHBRUSH AT BEDTIME    . butalbital-acetaminophen-caffeine (FIORICET) 50-325-40 MG tablet Take 1-2 tablets by mouth every 6 (six) hours as needed for headache (severe). (Patient not taking: Reported on 09/06/2019) 10 tablet 0  . diphenhydrAMINE (BENADRYL) 25 mg capsule Take 25 mg by mouth at bedtime as needed.  (  Patient not taking: Reported on 09/06/2019)    . ibuprofen (ADVIL) 200 MG tablet Take 400-600 mg by mouth every 8 (eight) hours as needed for moderate pain.  (Patient not taking: Reported on 09/06/2019)    . lidocaine (XYLOCAINE) 2 % solution 10 mLs every 4 (four) hours as needed. (Patient not taking: Reported on 09/06/2019)    . lidocaine-prilocaine (EMLA) cream Apply a small amount to port a cath site and cover with plastic wrap 1 hour prior to chemotherapy appointments (Patient not taking: Reported on 09/06/2019) 30 g 3  . magic mouthwash SOLN Take 5 mLs by mouth 3 (three) times daily as needed for mouth pain. (Patient not taking: Reported on 09/13/2019) 15 mL 6  . prochlorperazine (COMPAZINE) 10 MG tablet Take 1 tablet (10 mg total) by mouth every 6 (six) hours as needed (Nausea or vomiting). (Patient not taking: Reported on 09/13/2019) 30 tablet 1   No current facility-administered medications for this visit.    ALLERGIES:  Allergies  Allergen Reactions  . Sulfamethoxazole-Trimethoprim Other (See Comments)    Headaches  . Penicillins Rash    20 years    PHYSICAL EXAM:  Performance status (ECOG): 1 - Symptomatic but completely ambulatory  Vitals:   09/13/19 0848  BP: (!) 128/51  Pulse: 85  Resp: 18  Temp:  98.2 F (36.8 C)  SpO2: 100%   Wt Readings from Last 3 Encounters:  09/13/19 197 lb 12.8 oz (89.7 kg)  09/06/19 206 lb 12.8 oz (93.8 kg)  08/30/19 212 lb 9.6 oz (96.4 kg)   Physical Exam Vitals reviewed.  Constitutional:      Appearance: Normal appearance. She is obese.  HENT:     Mouth/Throat:     Lips: No lesions.     Mouth: No oral lesions.     Tongue: No lesions.  Neck:     Vascular: No carotid bruit.  Cardiovascular:     Rate and Rhythm: Normal rate and regular rhythm.     Pulses: Normal pulses.     Heart sounds: Normal heart sounds.  Pulmonary:     Effort: Pulmonary effort is normal.     Breath sounds: Normal breath sounds.  Chest:     Comments: Port on right chest Musculoskeletal:     Cervical back: Normal range of motion and neck supple.     Right lower leg: No edema.     Left lower leg: No edema.  Neurological:     General: No focal deficit present.     Mental Status: She is alert and oriented to person, place, and time.  Psychiatric:        Mood and Affect: Mood normal.        Behavior: Behavior normal.     LABORATORY DATA:  I have reviewed the labs as listed.  CBC Latest Ref Rng & Units 09/13/2019 09/06/2019 08/30/2019  WBC 4.0 - 10.5 K/uL 4.9 5.0 6.3  Hemoglobin 12.0 - 15.0 g/dL 11.6(L) 12.2 12.6  Hematocrit 36 - 46 % 35.6(L) 37.9 38.8  Platelets 150 - 400 K/uL 191 241 228   CMP Latest Ref Rng & Units 09/13/2019 09/06/2019 08/30/2019  Glucose 70 - 99 mg/dL 112(H) 125(H) 101(H)  BUN 8 - 23 mg/dL 14 11 12   Creatinine 0.44 - 1.00 mg/dL 0.81 0.79 0.73  Sodium 135 - 145 mmol/L 136 137 137  Potassium 3.5 - 5.1 mmol/L 4.1 4.2 4.3  Chloride 98 - 111 mmol/L 98 101 99  CO2 22 - 32 mmol/L 28  27 28  Calcium 8.9 - 10.3 mg/dL 8.8(L) 8.7(L) 8.8(L)  Total Protein 6.5 - 8.1 g/dL 6.5 6.5 6.8  Total Bilirubin 0.3 - 1.2 mg/dL 0.6 0.7 0.8  Alkaline Phos 38 - 126 U/L 85 91 94  AST 15 - 41 U/L 20 20 18   ALT 0 - 44 U/L 25 24 24     DIAGNOSTIC IMAGING:  I have  independently reviewed the scans and discussed with the patient. No results found.   ASSESSMENT:  1.Squamous cell carcinoma, p16 positiveof the left tonsil: - 06/29/2019 Biopsy A. LYMPH NODE, LEFT NECK, NEEDLE CORE BIOPSY:  - Squamous cell carcinoma, p16 positive.  COMMENT:  The carcinoma is positive with p16, p40, cytokeratin 5/6 and p63 consistent with squamous cell carcinoma. The carcinoma is negative with Epstein-Barr virus (EBV), TTF-1 and Napsin-A. -PET scan on 07/18/2019 showed left level 2A lymph node, 1 cm. Small rounded lymph node 8 mm at level 2B on the left side. Intense palatine tonsillar uptake with asymmetric fullness of tonsillar tissue and symmetrical FDG activity with masslike changes on the left side measuring 2.2 x 1.7 cm compared to the right. SUV 16.2. Small focus of increased activity in the right neck adjacent to or within the small nodule in the right hemithyroid. -An ultrasound of the thyroid gland showed heterogeneous thyroid with no masses. -She was evaluated by Dr.Yanagihara. -XRT with weekly cisplatin started on 08/16/2019.   PLAN:  1. 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  They are grossly within normal limits. -She will proceed with her cycle 5 today.  I will reevaluate her in 1 week.  2. Hypothyroidism: -Continue Synthroid 50 mcg daily.  3.   Throat pain: -She will fill the prescription for Magic mouthwash with lidocaine.  4.  Weight loss: -She lost about 9 pounds since last 1 week.  She lost a total of 15 to 18 pounds. -She was told to use Magic mouthwash with lidocaine and start drinking nutritional supplements up to 5 cans a day.   Orders placed this encounter:  Orders Placed This Encounter  Procedures  . CBC with Differential  . Comprehensive metabolic panel  . Magnesium     Derek Jack, MD [place] [phone]   I, Milinda Antis, am  acting as a scribe for Dr. Sanda Linger.  I, Derek Jack MD, have reviewed the above documentation for accuracy and completeness, and I agree with the above.

## 2019-09-13 NOTE — Patient Instructions (Signed)
[facility] Discharge Instructions for Patients Receiving Chemotherapy  Today you received the following chemotherapy agents   To help prevent nausea and vomiting after your treatment, we encourage you to take your nausea medication   If you develop nausea and vomiting that is not controlled by your nausea medication, call the clinic.   BELOW ARE SYMPTOMS THAT SHOULD BE REPORTED IMMEDIATELY:  *FEVER GREATER THAN 100.5 F  *CHILLS WITH OR WITHOUT FEVER  NAUSEA AND VOMITING THAT IS NOT CONTROLLED WITH YOUR NAUSEA MEDICATION  *UNUSUAL SHORTNESS OF BREATH  *UNUSUAL BRUISING OR BLEEDING  TENDERNESS IN MOUTH AND THROAT WITH OR WITHOUT PRESENCE OF ULCERS  *URINARY PROBLEMS  *BOWEL PROBLEMS  UNUSUAL RASH Items with * indicate a potential emergency and should be followed up as soon as possible.  Feel free to call the clinic should you have any questions or concerns. The clinic phone number is [phone].  Please show the CHEMO ALERT CARD at check-in to the Emergency Department and triage nurse.   

## 2019-09-13 NOTE — Progress Notes (Signed)
Patient has been assessed, vital signs and labs have been reviewed by Dr. Katragadda. ANC, Creatinine, LFTs, and Platelets are within treatment parameters per Dr. Katragadda. The patient is good to proceed with treatment at this time.  

## 2019-09-14 ENCOUNTER — Inpatient Hospital Stay (HOSPITAL_COMMUNITY): Payer: PPO

## 2019-09-14 ENCOUNTER — Encounter (HOSPITAL_COMMUNITY): Payer: Self-pay

## 2019-09-14 VITALS — BP 154/60 | HR 65 | Temp 98.1°F | Resp 18 | Wt 206.8 lb

## 2019-09-14 DIAGNOSIS — E860 Dehydration: Secondary | ICD-10-CM

## 2019-09-14 DIAGNOSIS — Z5111 Encounter for antineoplastic chemotherapy: Secondary | ICD-10-CM | POA: Diagnosis not present

## 2019-09-14 DIAGNOSIS — C099 Malignant neoplasm of tonsil, unspecified: Secondary | ICD-10-CM

## 2019-09-14 DIAGNOSIS — C090 Malignant neoplasm of tonsillar fossa: Secondary | ICD-10-CM | POA: Diagnosis not present

## 2019-09-14 MED ORDER — SODIUM CHLORIDE 0.9% FLUSH
10.0000 mL | Freq: Once | INTRAVENOUS | Status: AC | PRN
  Administered 2019-09-14: 10 mL

## 2019-09-14 MED ORDER — HEPARIN SOD (PORK) LOCK FLUSH 100 UNIT/ML IV SOLN
500.0000 [IU] | Freq: Once | INTRAVENOUS | Status: AC | PRN
  Administered 2019-09-14: 500 [IU]

## 2019-09-14 MED ORDER — LIDOCAINE VISCOUS HCL 2 % MT SOLN
30.0000 mL | Freq: Once | OROMUCOSAL | Status: AC
  Administered 2019-09-14: 30 mL via OROMUCOSAL
  Filled 2019-09-14: qty 30

## 2019-09-14 MED ORDER — ALUM & MAG HYDROXIDE-SIMETH 200-200-20 MG/5ML PO SUSP
30.0000 mL | Freq: Once | ORAL | Status: AC
  Administered 2019-09-14: 30 mL via ORAL
  Filled 2019-09-14: qty 30

## 2019-09-14 MED ORDER — SODIUM CHLORIDE 0.9 % IV SOLN
Freq: Once | INTRAVENOUS | Status: AC
  Filled 2019-09-14: qty 1000

## 2019-09-14 NOTE — Progress Notes (Signed)
Patient tolerated hydration with no complaints voiced.  Port site clean and dry with good blood return noted before and after hydration.  No bruising or swelling noted with port.  Band aid applied.  VSS with discharge and left ambulatory with no s/s of distress noted.   

## 2019-09-15 ENCOUNTER — Encounter (HOSPITAL_COMMUNITY): Payer: PPO

## 2019-09-15 ENCOUNTER — Telehealth (HOSPITAL_COMMUNITY): Payer: Self-pay

## 2019-09-15 DIAGNOSIS — C090 Malignant neoplasm of tonsillar fossa: Secondary | ICD-10-CM | POA: Diagnosis not present

## 2019-09-15 NOTE — Telephone Encounter (Signed)
Nutrition  RD called for nutrition follow-up.  No answer.  Left message with call back number.  Puja Caffey B. Zenia Resides, [place], [place] Registered Dietitian [phone] (pager)

## 2019-09-18 ENCOUNTER — Inpatient Hospital Stay (HOSPITAL_COMMUNITY): Payer: PPO

## 2019-09-18 ENCOUNTER — Other Ambulatory Visit: Payer: Self-pay

## 2019-09-18 ENCOUNTER — Encounter (HOSPITAL_COMMUNITY): Payer: Self-pay

## 2019-09-18 ENCOUNTER — Other Ambulatory Visit (HOSPITAL_COMMUNITY): Payer: Self-pay

## 2019-09-18 DIAGNOSIS — C099 Malignant neoplasm of tonsil, unspecified: Secondary | ICD-10-CM

## 2019-09-18 DIAGNOSIS — R110 Nausea: Secondary | ICD-10-CM

## 2019-09-18 DIAGNOSIS — R300 Dysuria: Secondary | ICD-10-CM | POA: Diagnosis not present

## 2019-09-18 DIAGNOSIS — Z5111 Encounter for antineoplastic chemotherapy: Secondary | ICD-10-CM | POA: Diagnosis not present

## 2019-09-18 DIAGNOSIS — C090 Malignant neoplasm of tonsillar fossa: Secondary | ICD-10-CM | POA: Diagnosis not present

## 2019-09-18 DIAGNOSIS — E860 Dehydration: Secondary | ICD-10-CM

## 2019-09-18 LAB — COMPREHENSIVE METABOLIC PANEL
ALT: 23 U/L (ref 0–44)
AST: 21 U/L (ref 15–41)
Albumin: 3.9 g/dL (ref 3.5–5.0)
Alkaline Phosphatase: 87 U/L (ref 38–126)
Anion gap: 10 (ref 5–15)
BUN: 12 mg/dL (ref 8–23)
CO2: 28 mmol/L (ref 22–32)
Calcium: 8.3 mg/dL — ABNORMAL LOW (ref 8.9–10.3)
Chloride: 95 mmol/L — ABNORMAL LOW (ref 98–111)
Creatinine, Ser: 0.85 mg/dL (ref 0.44–1.00)
GFR calc Af Amer: >60 mL/min (ref >60)
GFR calc non Af Amer: >60 mL/min (ref >60)
Glucose, Bld: 122 mg/dL — ABNORMAL HIGH (ref 70–99)
Potassium: 4 mmol/L (ref 3.5–5.1)
Sodium: 133 mmol/L — ABNORMAL LOW (ref 135–145)
Total Bilirubin: 0.8 mg/dL (ref 0.3–1.2)
Total Protein: 6.6 g/dL (ref 6.5–8.1)

## 2019-09-18 LAB — CBC WITH DIFFERENTIAL/PLATELET
Abs Immature Granulocytes: 0.02 10*3/uL (ref 0.00–0.07)
Basophils Absolute: 0 10*3/uL (ref 0.0–0.1)
Basophils Relative: 0 %
Eosinophils Absolute: 0 10*3/uL (ref 0.0–0.5)
Eosinophils Relative: 0 %
HCT: 34.2 % — ABNORMAL LOW (ref 36.0–46.0)
Hemoglobin: 11.5 g/dL — ABNORMAL LOW (ref 12.0–15.0)
Immature Granulocytes: 0 %
Lymphocytes Relative: 9 %
Lymphs Abs: 0.5 10*3/uL — ABNORMAL LOW (ref 0.7–4.0)
MCH: 30.4 pg (ref 26.0–34.0)
MCHC: 33.6 g/dL (ref 30.0–36.0)
MCV: 90.5 fL (ref 80.0–100.0)
Monocytes Absolute: 0.4 10*3/uL (ref 0.1–1.0)
Monocytes Relative: 6 %
Neutro Abs: 4.8 10*3/uL (ref 1.7–7.7)
Neutrophils Relative %: 85 %
Platelets: 136 10*3/uL — ABNORMAL LOW (ref 150–400)
RBC: 3.78 MIL/uL — ABNORMAL LOW (ref 3.87–5.11)
RDW: 12.7 % (ref 11.5–15.5)
WBC: 5.8 10*3/uL (ref 4.0–10.5)
nRBC: 0 % (ref 0.0–0.2)

## 2019-09-18 LAB — MAGNESIUM: Magnesium: 1.4 mg/dL — ABNORMAL LOW (ref 1.7–2.4)

## 2019-09-18 MED ORDER — HEPARIN SOD (PORK) LOCK FLUSH 100 UNIT/ML IV SOLN
500.0000 [IU] | Freq: Once | INTRAVENOUS | Status: AC | PRN
  Administered 2019-09-18: 500 [IU]

## 2019-09-18 MED ORDER — SODIUM CHLORIDE 0.9 % IV SOLN
Freq: Once | INTRAVENOUS | Status: AC
  Filled 2019-09-18: qty 1000

## 2019-09-18 MED ORDER — ONDANSETRON HCL 4 MG/2ML IJ SOLN: 8.0000 mg | Freq: Once | INTRAMUSCULAR | Status: DC

## 2019-09-18 MED ORDER — SODIUM CHLORIDE 0.9 % IV SOLN
Freq: Once | INTRAVENOUS | Status: AC
  Administered 2019-09-18: 8 mg via INTRAVENOUS
  Filled 2019-09-18: qty 4

## 2019-09-18 MED ORDER — CIPROFLOXACIN HCL 500 MG PO TABS
500.0000 mg | ORAL_TABLET | Freq: Two times a day (BID) | ORAL | 0 refills | Status: DC
Start: 2019-09-18 — End: 2019-09-20

## 2019-09-18 NOTE — Progress Notes (Signed)
Call received from [facility] regarding patient complaints of nausea, decreased oral intake, and burning with urination.    Patient to the treatment room for hydration and labs.  Patient stated [facility] did a urine culture before she left today.   Patient tolerated hydration with no complaints voiced.  Port site clean and dry with good blood return noted before and after hydration.  No bruising or swelling noted with port.  Band aid applied.  VSS with discharge and left ambulatory with no s/s of distress noted.

## 2019-09-18 NOTE — Progress Notes (Signed)
[place], Engineer, site from [employer] faxed over patient's urinalysis from today. Dr. Delton Coombes notified of results. Medications order placed per verbal order. Patient notified.

## 2019-09-19 DIAGNOSIS — C099 Malignant neoplasm of tonsil, unspecified: Secondary | ICD-10-CM | POA: Diagnosis not present

## 2019-09-19 DIAGNOSIS — C090 Malignant neoplasm of tonsillar fossa: Secondary | ICD-10-CM | POA: Diagnosis not present

## 2019-09-20 ENCOUNTER — Inpatient Hospital Stay (HOSPITAL_COMMUNITY): Payer: PPO | Admitting: Hematology

## 2019-09-20 ENCOUNTER — Inpatient Hospital Stay (HOSPITAL_COMMUNITY): Payer: PPO

## 2019-09-20 ENCOUNTER — Other Ambulatory Visit (HOSPITAL_COMMUNITY): Payer: Self-pay

## 2019-09-20 ENCOUNTER — Other Ambulatory Visit: Payer: Self-pay

## 2019-09-20 VITALS — BP 137/59 | HR 86 | Temp 96.7°F | Resp 18 | Wt 199.1 lb

## 2019-09-20 VITALS — BP 142/66 | HR 72 | Temp 97.6°F | Resp 18

## 2019-09-20 DIAGNOSIS — C099 Malignant neoplasm of tonsil, unspecified: Secondary | ICD-10-CM

## 2019-09-20 DIAGNOSIS — Z95828 Presence of other vascular implants and grafts: Secondary | ICD-10-CM

## 2019-09-20 DIAGNOSIS — Z5111 Encounter for antineoplastic chemotherapy: Secondary | ICD-10-CM | POA: Diagnosis not present

## 2019-09-20 DIAGNOSIS — C090 Malignant neoplasm of tonsillar fossa: Secondary | ICD-10-CM | POA: Diagnosis not present

## 2019-09-20 MED ORDER — PALONOSETRON HCL INJECTION 0.25 MG/5ML
0.2500 mg | Freq: Once | INTRAVENOUS | Status: AC
  Administered 2019-09-20: 0.25 mg via INTRAVENOUS
  Filled 2019-09-20: qty 5

## 2019-09-20 MED ORDER — CIPRO 500 MG/5ML (10%) PO SUSR: 500.0000 mg | Freq: Two times a day (BID) | ORAL | 0 refills | Status: DC

## 2019-09-20 MED ORDER — SCOPOLAMINE 1 MG/3DAYS TD PT72: 1.0000 | MEDICATED_PATCH | TRANSDERMAL | 12 refills | Status: DC

## 2019-09-20 MED ORDER — SODIUM CHLORIDE 0.9 % IV SOLN
150.0000 mg | Freq: Once | INTRAVENOUS | Status: AC
  Administered 2019-09-20: 150 mg via INTRAVENOUS
  Filled 2019-09-20: qty 150

## 2019-09-20 MED ORDER — SCOPOLAMINE 1 MG/3DAYS TD PT72
MEDICATED_PATCH | TRANSDERMAL | Status: AC
  Filled 2019-09-20: qty 1

## 2019-09-20 MED ORDER — HEPARIN SOD (PORK) LOCK FLUSH 100 UNIT/ML IV SOLN
500.0000 [IU] | Freq: Once | INTRAVENOUS | Status: AC | PRN
  Administered 2019-09-20: 500 [IU]

## 2019-09-20 MED ORDER — SODIUM CHLORIDE 0.9 % IV SOLN
10.0000 mg | Freq: Once | INTRAVENOUS | Status: AC
  Administered 2019-09-20: 10 mg via INTRAVENOUS
  Filled 2019-09-20: qty 10

## 2019-09-20 MED ORDER — CIPRO 500 MG/5ML (10%) PO SUSR
500.0000 mg | Freq: Two times a day (BID) | ORAL | 0 refills | Status: DC
Start: 2019-09-20 — End: 2019-09-20

## 2019-09-20 MED ORDER — SCOPOLAMINE 1 MG/3DAYS TD PT72
1.0000 | MEDICATED_PATCH | Freq: Once | TRANSDERMAL | Status: DC
  Administered 2019-09-20: 1.5 mg via TRANSDERMAL

## 2019-09-20 MED ORDER — SODIUM CHLORIDE 0.9 % IV SOLN
40.0000 mg/m2 | Freq: Once | INTRAVENOUS | Status: AC
  Administered 2019-09-20: 84 mg via INTRAVENOUS
  Filled 2019-09-20: qty 84

## 2019-09-20 MED ORDER — POTASSIUM CHLORIDE 2 MEQ/ML IV SOLN
Freq: Once | INTRAVENOUS | Status: AC
  Filled 2019-09-20: qty 10

## 2019-09-20 MED ORDER — SODIUM CHLORIDE 0.9 % IV SOLN: Freq: Once | INTRAVENOUS | Status: AC

## 2019-09-20 MED ORDER — ONDANSETRON 8 MG PO TBDP
8.0000 mg | ORAL_TABLET | Freq: Three times a day (TID) | ORAL | 2 refills | Status: DC | PRN
Start: 2019-09-20 — End: 2019-12-28

## 2019-09-20 MED ORDER — SODIUM CHLORIDE 0.9% FLUSH: 10.0000 mL | INTRAVENOUS | Status: DC | PRN

## 2019-09-20 MED ORDER — LIDOCAINE VISCOUS HCL 2 % MT SOLN
15.0000 mL | Freq: Once | OROMUCOSAL | Status: AC
  Administered 2019-09-20: 15 mL via OROMUCOSAL
  Filled 2019-09-20: qty 15

## 2019-09-20 NOTE — Progress Notes (Signed)
Labs from Monday used for today's treatment and ok'd by Dr. Delton Coombes.

## 2019-09-20 NOTE — Progress Notes (Signed)
Verbal orders received for lidocaine 2% for patient to mix with her dukes's mouth wash she has at bedside.  She is also going to try scopolamine patch for nausea.  Dr. Delton Coombes wants her to get treatment today and then fluids Thursday and Friday this week.  We are also going to try and find her a pharmacy that has liquid cipro in stock, as she is unable to swallow the pills.

## 2019-09-20 NOTE — Progress Notes (Signed)
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[place])   07/26/2019 Cancer Staging   Staging form: Pharynx - HPV-Mediated Oropharynx, AJCC 8th Edition - Clinical stage from 07/26/2019: Stage I (cT2, cN1, cM0, p16+) - Signed by Derek Jack, MD on 07/26/2019   08/16/2019 -  Chemotherapy   The patient had palonosetron (ALOXI) injection 0.25 mg, 0.25 mg, Intravenous,  Once, 5 of 7 cycles Administration: 0.25 mg (08/16/2019), 0.25 mg (08/23/2019), 0.25 mg (08/30/2019), 0.25 mg (09/06/2019), 0.25 mg (09/13/2019) CISplatin (PLATINOL) 84 mg in sodium chloride 0.9 % 250 mL chemo infusion, 40 mg/m2 = 84 mg, Intravenous,  Once, 5 of 7 cycles Administration: 84 mg (08/16/2019), 84 mg (08/23/2019), 84 mg (08/30/2019), 84 mg (09/06/2019), 84 mg (09/13/2019) fosaprepitant (EMEND) 150 mg in sodium chloride 0.9 % 145 mL IVPB, 150 mg, Intravenous,  Once, 5 of 7 cycles Administration: 150 mg (08/16/2019), 150 mg (08/23/2019), 150 mg (08/30/2019), 150 mg (09/06/2019), 150 mg (09/13/2019)  for chemotherapy treatment.      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 female, returns for routine follow-up and consideration for next cycle of chemotherapy. Kristi Weiss was last seen on 09/13/2019.  Due for cycle #6 of cisplatin and Aloxi  today.   Today she complains of having no taste and vomiting anything she eats. She is tired, nauseous, and vomiting solids and heaving with fluids. She continues having dry mouth and uses the Magic mouthwash sparingly. Her dry mouth keeps waking her up at night since she gets up at night to rinse her mouth with water. She is tolerating the radiation okay. Yesterday, she had 1 high-calorie Boost can and tried to eat egg drop soup, but started vomiting as she was eating it.  She is supposed to finish radiation on 09/28/2019.  Overall, she feels ready for next cycle of chemo today.    REVIEW OF SYSTEMS:  Review of Systems  Constitutional: Positive for appetite change (depleted) and fatigue (depleted).  HENT:   Positive for sore throat and trouble swallowing (dry mouth).   Gastrointestinal: Positive for diarrhea, nausea and vomiting.  Neurological: Positive for headaches.  Psychiatric/Behavioral: Positive for sleep disturbance (d/t dry mouth).  All other systems reviewed and are negative.   PAST MEDICAL/SURGICAL HISTORY:  Past Medical History:  Diagnosis Date  . Anxiety about health 08/23/2019  . Arthritis   . GERD (gastroesophageal reflux disease)   . Hypothyroidism   . Hypothyroidism (acquired) 08/23/2019  . 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 Port-A-Cath in place 08/09/2019  . 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 Scoliosis   . Sinusitis   . 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  Past Surgical History:  Procedure Laterality Date  . NO PAST SURGERIES    . No prior surgery    . PORTACATH PLACEMENT Right 08/01/2019   Procedure: INSERTION PORT-A-CATH;  Surgeon: Aviva Signs, MD;  Location: [facility];  Service: General;  Laterality: Right;    SOCIAL HISTORY:  Social History   Socioeconomic  History  . Marital status: Widowed    Spouse name: Not on file  . Number of children: 1  . Years of education: Not on file  . Highest education level: Not on file  Occupational History  . Occupation: RETIRED  Tobacco Use  . Smoking status: Former Research scientist (life sciences)  . Smokeless tobacco: Never Used  . Tobacco comment: some in her 34s and 30s  Vaping Use  . Vaping Use: Never used  Substance and Sexual Activity  . Alcohol use: Yes    Comment: on rare occasion "like twice a year"  . Drug use: Never  . Sexual activity: Not Currently  Other Topics Concern  . Not on file  Social History Narrative   Lives at home alone   Retired   Widow   Caffeine: about 4 cups coffee, 3 glasses of tea daily   Social Determinants of Health   Financial Resource Strain: Low Risk   . Difficulty of Paying Living Expenses: Not hard at all  Food Insecurity: No Food Insecurity  . Worried About Charity fundraiser in the Last Year: Never true  . Ran Out of Food in the Last Year: Never true  Transportation Needs: No Transportation Needs  . Lack of Transportation (Medical): No  . Lack of Transportation (Non-Medical): No  Physical Activity: Sufficiently Active  . Days of Exercise per Week: 5 days  . Minutes of Exercise per Session: 30 min  Stress: Stress Concern Present  . Feeling of Stress : Very much  Social Connections:   . Frequency of Communication with Friends and Family:   . Frequency of Social Gatherings with Friends and Family:   . Attends Religious Services:   . Active Member of Clubs or Organizations:   . Attends Archivist Meetings:   Marland Kitchen Marital Status:   Intimate Partner Violence: Not At Risk  . Fear of Current or Ex-Partner: No  . Emotionally Abused: No  . Physically Abused: No  . Sexually Abused: No    FAMILY HISTORY:  Family History  Problem Relation Age of Onset  . Lung cancer Mother        smoker  . Cancer Mother        Throat  . Lung cancer Father        smoker  .  Diabetes Father   . CVA Brother   . Diabetes Brother   . Diabetes Maternal Grandmother   . Heart attack Paternal Grandfather   . Migraines Neg Hx   . Headache Neg Hx     CURRENT MEDICATIONS:  Current Outpatient Medications  Medication Sig Dispense Refill  . butalbital-acetaminophen-caffeine (FIORICET) 50-325-40 MG tablet Take 1-2 tablets by mouth every 6 (six) hours as needed for headache (severe). 10 tablet 0  . ciprofloxacin (CIPRO) 500 MG tablet Take 1 tablet (500 mg total) by mouth 2 (two) times daily for 5 days. 10 tablet 0  . CISplatin in sodium chloride 0.9 % 250 mL Inject 40 mg/m2 into the vein once a week.    . diphenhydrAMINE (BENADRYL) 25 mg capsule Take 25 mg by mouth at bedtime as needed.     Marland Kitchen esomeprazole (  NEXIUM) 40 MG capsule Take 40 mg by mouth daily.     Marland Kitchen ibuprofen (ADVIL) 200 MG tablet Take 400-600 mg by mouth every 8 (eight) hours as needed for moderate pain.     Marland Kitchen levothyroxine (SYNTHROID) 50 MCG tablet Take 50 mcg by mouth daily. BRAND    . lidocaine (XYLOCAINE) 2 % solution 10 mLs every 4 (four) hours as needed.     . lidocaine-prilocaine (EMLA) cream Apply a small amount to port a cath site and cover with plastic wrap 1 hour prior to chemotherapy appointments 30 g 3  . magic mouthwash SOLN Take 5 mLs by mouth 3 (three) times daily as needed for mouth pain. 15 mL 6  . PREVIDENT 5000 BOOSTER PLUS 1.1 % PSTE APPLY WITH TOOTHBRUSH AT BEDTIME    . prochlorperazine (COMPAZINE) 10 MG tablet Take 1 tablet (10 mg total) by mouth every 6 (six) hours as needed (Nausea or vomiting). 30 tablet 1   No current facility-administered medications for this visit.    ALLERGIES:  Allergies  Allergen Reactions  . Sulfamethoxazole-Trimethoprim Other (See Comments)    Headaches  . Penicillins Rash    20 years    PHYSICAL EXAM:  Performance status (ECOG): 1 - Symptomatic but completely ambulatory  Vitals:   09/20/19 0842  BP: (!) 137/59  Pulse: 86  Resp: 18  Temp: (!)  96.7 F (35.9 C)  SpO2: 97%   Wt Readings from Last 3 Encounters:  09/20/19 199 lb 1.2 oz (90.3 kg)  09/18/19 200 lb 9.6 oz (91 kg)  09/14/19 206 lb 12.8 oz (93.8 kg)   Physical Exam Vitals reviewed.  Constitutional:      Appearance: Normal appearance. She is obese.  HENT:     Mouth/Throat:     Comments: Erythema on L soft palate  Cardiovascular:     Rate and Rhythm: Normal rate and regular rhythm.     Pulses: Normal pulses.     Heart sounds: Normal heart sounds.  Pulmonary:     Effort: Pulmonary effort is normal.     Breath sounds: Normal breath sounds.  Chest:     Comments: Port-a-Cath on R chest Neurological:     General: No focal deficit present.     Mental Status: She is alert and oriented to person, place, and time.  Psychiatric:        Mood and Affect: Mood normal.        Behavior: Behavior normal.     LABORATORY DATA:  I have reviewed the labs as listed.  CBC Latest Ref Rng & Units 09/18/2019 09/13/2019 09/06/2019  WBC 4.0 - 10.5 K/uL 5.8 4.9 5.0  Hemoglobin 12.0 - 15.0 g/dL 11.5(L) 11.6(L) 12.2  Hematocrit 36 - 46 % 34.2(L) 35.6(L) 37.9  Platelets 150 - 400 K/uL 136(L) 191 241   CMP Latest Ref Rng & Units 09/18/2019 09/13/2019 09/06/2019  Glucose 70 - 99 mg/dL 122(H) 112(H) 125(H)  BUN 8 - 23 mg/dL 12 14 11   Creatinine 0.44 - 1.00 mg/dL 0.85 0.81 0.79  Sodium 135 - 145 mmol/L 133(L) 136 137  Potassium 3.5 - 5.1 mmol/L 4.0 4.1 4.2  Chloride 98 - 111 mmol/L 95(L) 98 101  CO2 22 - 32 mmol/L 28 28 27   Calcium 8.9 - 10.3 mg/dL 8.3(L) 8.8(L) 8.7(L)  Total Protein 6.5 - 8.1 g/dL 6.6 6.5 6.5  Total Bilirubin 0.3 - 1.2 mg/dL 0.8 0.6 0.7  Alkaline Phos 38 - 126 U/L 87 85 91  AST 15 - 41  U/L 21 20 20   ALT 0 - 44 U/L 23 25 24     DIAGNOSTIC IMAGING:  I have independently reviewed the scans and discussed with the patient. No results found.   ASSESSMENT:  1.Squamous cell carcinoma, p16 positiveof the left tonsil: - 06/29/2019 Biopsy A. LYMPH NODE, LEFT NECK,  NEEDLE CORE BIOPSY:  - Squamous cell carcinoma, p16 positive.  COMMENT:  The carcinoma is positive with p16, p40, cytokeratin 5/6 and p63 consistent with squamous cell carcinoma. The carcinoma is negative with Epstein-Barr virus (EBV), TTF-1 and Napsin-A. -PET scan on 07/18/2019 showed left level 2A lymph node, 1 cm. Small rounded lymph node 8 mm at level 2B on the left side. Intense palatine tonsillar uptake with asymmetric fullness of tonsillar tissue and symmetrical FDG activity with masslike changes on the left side measuring 2.2 x 1.7 cm compared to the right. SUV 16.2. Small focus of increased activity in the right neck adjacent to or within the small nodule in the right hemithyroid. -An ultrasound of the thyroid gland showed heterogeneous thyroid with no masses. -She was evaluated by Dr.Yanagihara. -XRT with weekly cisplatin started on 08/16/2019.   PLAN:  1. 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 -She will proceed with her week 6 of cisplatin today.  This is likely her last cycle.  I will reevaluate her in 1 week.  2. Hypothyroidism: -Continue Synthroid 50 mcg daily.  3.  Throat pain: -She is using Magic mouthwash.  I have told her to mix it with the viscous lidocaine.  4.  Weight loss: -She lost total of 17 pounds since the start of therapy.  She lost 9 pounds in 1 week. -She has been drinking 1 can of Ensure daily.  She is not eating any solid foods because of gagging. -I have suggested her to use Magic mouthwash with lidocaine and drink at least 3 to 4 cans of Ensure high energy. -I will arrange her fluids Thursday, Friday and Monday.  5.  Nausea/vomiting: -She has nausea even on drinking water. -We will start her on scopolamine patch and give her prescription if it helps.  We will also give prescription for Zofran ODT to be taken along with scopolamine  patch.  Orders placed this encounter:  No orders of the defined types were placed in this encounter.    Derek Jack, MD [place] [phone]   I, Milinda Antis, am acting as a scribe for Dr. Sanda Linger.  I, Derek Jack MD, have reviewed the above documentation for accuracy and completeness, and I agree with the above.

## 2019-09-20 NOTE — Progress Notes (Signed)
Ok to give the patient NACL 500 ml post electrolyte hydration verbal order Dr. Delton Coombes.   Patient tolerated chemotherapy with no complaints voiced.  Side effects with management reviewed with understanding verbalized.  Port site clean and dry with no bruising or swelling noted at site.  Good blood return noted before and after administration of chemotherapy.  Band aid applied.  Patient left in satisfactory condition with VSS and no s/s of distress noted.

## 2019-09-20 NOTE — Patient Instructions (Signed)
[place] at [facility] Discharge Instructions  You were seen today by Dr. Delton Coombes. He went over your recent results. You received your treatment today. Mix the Magic mouthwash with lidocaine 1:1, swish and swallow. Continue drinking 3-4 high-calorie and high-protein shakes daily to increase your weight, and drink plenty of fluids. You will receive fluids on Thursday, Friday and Monday. Dr. Delton Coombes will see you back in 1 week for labs and follow up.   Thank you for choosing [place] at [facility] to provide your oncology and hematology care.  To afford each patient quality time with our provider, please arrive at least 15 minutes before your scheduled appointment time.   If you have a lab appointment with the [facility] please come in thru the Main Entrance and check in at the main information desk  You need to re-schedule your appointment should you arrive 10 or more minutes late.  We strive to give you quality time with our providers, and arriving late affects you and other patients whose appointments are after yours.  Also, if you no show three or more times for appointments you may be dismissed from the clinic at the providers discretion.     Again, thank you for choosing [facility].  Our hope is that these requests will decrease the amount of time that you wait before being seen by our physicians.       _____________________________________________________________  Should you have questions after your visit to [facility], please contact our office at [phone] between the hours of 8:00 a.m. and 4:30 p.m.  Voicemails left after 4:00 p.m. will not be returned until the following business day.  For prescription refill requests, have your pharmacy contact our office and allow 72 hours.    Cancer Center Support Programs:   > Cancer Support Group  2nd Tuesday of the month 1pm-2pm, Journey Room

## 2019-09-21 ENCOUNTER — Inpatient Hospital Stay (HOSPITAL_COMMUNITY): Payer: PPO

## 2019-09-21 ENCOUNTER — Encounter (HOSPITAL_COMMUNITY): Payer: Self-pay

## 2019-09-21 VITALS — BP 137/59 | HR 60 | Temp 97.1°F | Resp 18

## 2019-09-21 DIAGNOSIS — Z5111 Encounter for antineoplastic chemotherapy: Secondary | ICD-10-CM | POA: Diagnosis not present

## 2019-09-21 DIAGNOSIS — C099 Malignant neoplasm of tonsil, unspecified: Secondary | ICD-10-CM

## 2019-09-21 DIAGNOSIS — E860 Dehydration: Secondary | ICD-10-CM

## 2019-09-21 DIAGNOSIS — R110 Nausea: Secondary | ICD-10-CM

## 2019-09-21 DIAGNOSIS — C090 Malignant neoplasm of tonsillar fossa: Secondary | ICD-10-CM | POA: Diagnosis not present

## 2019-09-21 MED ORDER — ONDANSETRON HCL 4 MG/2ML IJ SOLN
8.0000 mg | Freq: Once | INTRAMUSCULAR | Status: AC
  Administered 2019-09-21: 8 mg via INTRAVENOUS
  Filled 2019-09-21: qty 4

## 2019-09-21 MED ORDER — SODIUM CHLORIDE 0.9 % IV SOLN
Freq: Once | INTRAVENOUS | Status: AC
  Filled 2019-09-21: qty 1000

## 2019-09-21 MED ORDER — HEPARIN SOD (PORK) LOCK FLUSH 100 UNIT/ML IV SOLN
500.0000 [IU] | Freq: Once | INTRAVENOUS | Status: AC | PRN
  Administered 2019-09-21: 500 [IU]

## 2019-09-21 MED ORDER — SODIUM CHLORIDE 0.9% FLUSH: 10.0000 mL | Freq: Once | INTRAVENOUS | Status: DC | PRN

## 2019-09-21 NOTE — Progress Notes (Signed)
Patient tolerated hydration with no complaints voiced.  Port site clean and dry with good blood return noted before and after hydration.  No bruising or swelling noted with port.  Band aid applied.  VSS with discharge and left ambulatory with no s/s of distress noted.   

## 2019-09-22 ENCOUNTER — Inpatient Hospital Stay (HOSPITAL_COMMUNITY): Payer: PPO

## 2019-09-22 ENCOUNTER — Other Ambulatory Visit: Payer: Self-pay

## 2019-09-22 VITALS — BP 141/78 | HR 68 | Temp 98.1°F | Resp 18

## 2019-09-22 DIAGNOSIS — Z5111 Encounter for antineoplastic chemotherapy: Secondary | ICD-10-CM | POA: Diagnosis not present

## 2019-09-22 DIAGNOSIS — C090 Malignant neoplasm of tonsillar fossa: Secondary | ICD-10-CM | POA: Diagnosis not present

## 2019-09-22 DIAGNOSIS — E860 Dehydration: Secondary | ICD-10-CM

## 2019-09-22 DIAGNOSIS — C099 Malignant neoplasm of tonsil, unspecified: Secondary | ICD-10-CM

## 2019-09-22 MED ORDER — ONDANSETRON HCL 4 MG/2ML IJ SOLN
8.0000 mg | Freq: Once | INTRAMUSCULAR | Status: AC
  Administered 2019-09-22: 8 mg via INTRAVENOUS
  Filled 2019-09-22: qty 4

## 2019-09-22 MED ORDER — HEPARIN SOD (PORK) LOCK FLUSH 100 UNIT/ML IV SOLN
500.0000 [IU] | Freq: Once | INTRAVENOUS | Status: AC | PRN
  Administered 2019-09-22: 500 [IU]

## 2019-09-22 MED ORDER — SODIUM CHLORIDE 0.9% FLUSH: 10.0000 mL | Freq: Once | INTRAVENOUS | Status: DC | PRN

## 2019-09-22 MED ORDER — SODIUM CHLORIDE 0.9 % IV SOLN
Freq: Once | INTRAVENOUS | Status: AC
  Filled 2019-09-22: qty 1000

## 2019-09-22 NOTE — Progress Notes (Signed)
Nutrition Follow-up:  Patient with left tonsillar cancer, p 16 positive.  Patient has started chemotherapy and radiation therapy.   Spoke with in clinic during IV fluids.  Patient reports that throat is sore to swallow foods and liquids, no taste.  Reports yesterday drank a boost soothe and 1/2 [employer] shake.  Reports that when she gets things in her mouth she feels like gagging.  Scopolamine patch caused worsening dry mouth so she removed it.  Has just been taking compazine not zofran ODT.    Last radiation on 7/29.  No more chemotherapy planned.  Medications: zofran ODT, compazine  Labs: reviewed  Anthropometrics:   Weight 199 lb decreased to 212 lb 14.4 oz on 6/23  213 lb on 5/27 (UBW)  7% weight loss in the last 2 months, signficant   NUTRITION DIAGNOSIS: Predicted suboptimal energy intake continues   MALNUTRITION DIAGNOSIS: Patient meets criteria for severe malnutrition in acute illness as evidenced by 7% weight loss in 2 months and eating < 50% of estimated energy needs.    INTERVENTION:  RN, Ebony Hail reviewed how to take nausea medications (zofran and compazine).  Encouraged patient to take it around the clock.   Discussed strategies to help with nausea.  Handout provided.   Discussed small, frequents 1-2 oz liquids. More samples of 1.4 standard [employer] shake given to patient (vanilla)   MONITORING, EVALUATION, GOAL: weight trends, intake   NEXT VISIT: July 30th phone f/u  Krisandra Bueno B. Zenia Resides, [place], [place] Registered Dietitian [phone] (mobile)

## 2019-09-22 NOTE — Progress Notes (Signed)
Patient tolerated hydration with no complaints voiced.  Port site clean and dry with good blood return noted before and after hydration.  No bruising or swelling noted with port.  Band aid applied.  VSS with discharge and left ambulatory with no s/s of distress noted.   

## 2019-09-25 ENCOUNTER — Encounter (HOSPITAL_COMMUNITY): Payer: Self-pay | Admitting: Hematology

## 2019-09-25 ENCOUNTER — Other Ambulatory Visit: Payer: Self-pay

## 2019-09-25 ENCOUNTER — Inpatient Hospital Stay (HOSPITAL_COMMUNITY): Payer: PPO

## 2019-09-25 ENCOUNTER — Inpatient Hospital Stay (HOSPITAL_COMMUNITY): Payer: PPO | Admitting: Hematology

## 2019-09-25 VITALS — BP 147/70 | HR 81 | Temp 97.5°F | Resp 18 | Wt 195.0 lb

## 2019-09-25 DIAGNOSIS — C099 Malignant neoplasm of tonsil, unspecified: Secondary | ICD-10-CM | POA: Diagnosis not present

## 2019-09-25 DIAGNOSIS — C090 Malignant neoplasm of tonsillar fossa: Secondary | ICD-10-CM | POA: Diagnosis not present

## 2019-09-25 DIAGNOSIS — E860 Dehydration: Secondary | ICD-10-CM

## 2019-09-25 DIAGNOSIS — Z5111 Encounter for antineoplastic chemotherapy: Secondary | ICD-10-CM | POA: Diagnosis not present

## 2019-09-25 LAB — COMPREHENSIVE METABOLIC PANEL
ALT: 22 U/L (ref 0–44)
AST: 21 U/L (ref 15–41)
Albumin: 3.8 g/dL (ref 3.5–5.0)
Alkaline Phosphatase: 80 U/L (ref 38–126)
Anion gap: 13 (ref 5–15)
BUN: 16 mg/dL (ref 8–23)
CO2: 28 mmol/L (ref 22–32)
Calcium: 8.2 mg/dL — ABNORMAL LOW (ref 8.9–10.3)
Chloride: 95 mmol/L — ABNORMAL LOW (ref 98–111)
Creatinine, Ser: 1.03 mg/dL — ABNORMAL HIGH (ref 0.44–1.00)
GFR calc Af Amer: >60 mL/min (ref >60)
GFR calc non Af Amer: 54 mL/min — ABNORMAL LOW (ref >60)
Glucose, Bld: 112 mg/dL — ABNORMAL HIGH (ref 70–99)
Potassium: 3.4 mmol/L — ABNORMAL LOW (ref 3.5–5.1)
Sodium: 136 mmol/L (ref 135–145)
Total Bilirubin: 0.8 mg/dL (ref 0.3–1.2)
Total Protein: 6.4 g/dL — ABNORMAL LOW (ref 6.5–8.1)

## 2019-09-25 LAB — CBC WITH DIFFERENTIAL/PLATELET
Abs Immature Granulocytes: 0.01 10*3/uL (ref 0.00–0.07)
Basophils Absolute: 0 10*3/uL (ref 0.0–0.1)
Basophils Relative: 0 %
Eosinophils Absolute: 0 10*3/uL (ref 0.0–0.5)
Eosinophils Relative: 1 %
HCT: 31 % — ABNORMAL LOW (ref 36.0–46.0)
Hemoglobin: 10.4 g/dL — ABNORMAL LOW (ref 12.0–15.0)
Immature Granulocytes: 0 %
Lymphocytes Relative: 15 %
Lymphs Abs: 0.5 10*3/uL — ABNORMAL LOW (ref 0.7–4.0)
MCH: 30.4 pg (ref 26.0–34.0)
MCHC: 33.5 g/dL (ref 30.0–36.0)
MCV: 90.6 fL (ref 80.0–100.0)
Monocytes Absolute: 0.2 10*3/uL (ref 0.1–1.0)
Monocytes Relative: 8 %
Neutro Abs: 2.3 10*3/uL (ref 1.7–7.7)
Neutrophils Relative %: 76 %
Platelets: 128 10*3/uL — ABNORMAL LOW (ref 150–400)
RBC: 3.42 MIL/uL — ABNORMAL LOW (ref 3.87–5.11)
RDW: 12.6 % (ref 11.5–15.5)
WBC: 3 10*3/uL — ABNORMAL LOW (ref 4.0–10.5)
nRBC: 0 % (ref 0.0–0.2)

## 2019-09-25 LAB — MAGNESIUM: Magnesium: 1.2 mg/dL — ABNORMAL LOW (ref 1.7–2.4)

## 2019-09-25 MED ORDER — SODIUM CHLORIDE 0.9 % IV SOLN
Freq: Once | INTRAVENOUS | Status: AC
  Filled 2019-09-25: qty 1000

## 2019-09-25 MED ORDER — HEPARIN SOD (PORK) LOCK FLUSH 100 UNIT/ML IV SOLN
500.0000 [IU] | Freq: Once | INTRAVENOUS | Status: AC | PRN
  Administered 2019-09-25: 500 [IU]

## 2019-09-25 MED ORDER — SODIUM CHLORIDE 0.9% FLUSH
10.0000 mL | Freq: Once | INTRAVENOUS | Status: AC | PRN
  Administered 2019-09-25: 10 mL

## 2019-09-25 NOTE — Patient Instructions (Signed)
[facility] at [facility]  Discharge Instructions:   _______________________________________________________________  Thank you for choosing [facility] at [facility] to provide your oncology and hematology care.  To afford each patient quality time with our providers, please arrive at least 15 minutes before your scheduled appointment.  You need to re-schedule your appointment if you arrive 10 or more minutes late.  We strive to give you quality time with our providers, and arriving late affects you and other patients whose appointments are after yours.  Also, if you no show three or more times for appointments you may be dismissed from the clinic.  Again, thank you for choosing [facility] at [facility]. Our hope is that these requests will allow you access to exceptional care and in a timely manner. _______________________________________________________________  If you have questions after your visit, please contact our office at [phone] between the hours of 8:30 a.m. and 5:00 p.m. Voicemails left after 4:30 p.m. will not be returned until the following business day. _______________________________________________________________  For prescription refill requests, have your pharmacy contact our office. _______________________________________________________________  Recommendations made by the consultant and any test results will be sent to your referring physician. _______________________________________________________________ 

## 2019-09-25 NOTE — Progress Notes (Signed)
Patient presents today for follow up visit with Dr. Delton Coombes. Vital signs stable. Patient has complaints of no appetite. Patient states the Left side of her throat is painful.   Message received from Dr. Delton Coombes to start house fluids at this time.   Hydration given today per MD orders. Tolerated infusion without adverse affects. Vital signs stable. No complaints at this time. Discharged from clinic ambulatory. F/U with [facility] as scheduled.

## 2019-09-25 NOTE — Patient Instructions (Signed)
[place]rist[place] Weiss at [facility] Discharge Instructions  You were seen today by Dr. Delton Coombes. He went over your recent results. We will be scheduling you for fluids; you can call any time you think you need fluids. Dr. Delton Coombes will see you back in for labs and follow up.    Thank you for choosing [place] at [facility] to provide your oncology and hematology care.  To afford each patient quality time with our provider, please arrive at least 15 minutes before your scheduled appointment time.   If you have a lab appointment with the [place] [facility] please come in thru the Main Entrance and check in at the main information desk  You need to re-schedule your appointment should you arrive 10 or more minutes late.  We strive to give you quality time with our providers, and arriving late affects you and other patients whose appointments are after yours.  Also, if you no show three or more times for appointments you may be dismissed from the clinic at the providers discretion.     Again, thank you for choosing [facility].  Our hope is that these requests will decrease the amount of time that you wait before being seen by our physicians.       _____________________________________________________________  Should you have questions after your visit to [facility], please contact our office at [phone] between the hours of 8:00 a.m. and 4:30 p.m.  Voicemails left after 4:00 p.m. will not be returned until the following business day.  For prescription refill requests, have your pharmacy contact our office and allow 72 hours.    Cancer Center Support Programs:   > Cancer Support Group  2nd Tuesday of the month 1pm-2pm, Journey Room

## 2019-09-25 NOTE — Progress Notes (Signed)
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[place])   07/26/2019 Cancer Staging   Staging form: Pharynx - HPV-Mediated Oropharynx, AJCC 8th Edition - Clinical stage from 07/26/2019: Stage I (cT2, cN1, cM0, p16+) - Signed by Derek Jack, MD on 07/26/2019   08/16/2019 -  Chemotherapy   The patient had palonosetron (ALOXI) injection 0.25 mg, 0.25 mg, Intravenous,  Once, 6 of 7 cycles Administration: 0.25 mg (08/16/2019), 0.25 mg (08/23/2019), 0.25 mg (08/30/2019), 0.25 mg (09/06/2019), 0.25 mg (09/13/2019), 0.25 mg (09/20/2019) CISplatin (PLATINOL) 84 mg in sodium chloride 0.9 % 250 mL chemo infusion, 40 mg/m2 = 84 mg, Intravenous,  Once, 6 of 7 cycles Administration: 84 mg (08/16/2019), 84 mg (08/23/2019), 84 mg (08/30/2019), 84 mg (09/06/2019), 84 mg (09/13/2019), 84 mg (09/20/2019) fosaprepitant (EMEND) 150 mg in sodium chloride 0.9 % 145 mL IVPB, 150 mg, Intravenous,  Once, 6 of 7 cycles Administration: 150 mg (08/16/2019), 150 mg (08/23/2019), 150 mg (08/30/2019), 150 mg (09/06/2019), 150 mg (09/13/2019), 150 mg (09/20/2019)  for chemotherapy treatment.      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, a 74 y.o. female, returns for routine follow-up and consideration for next cycle of chemotherapy.  Kristi Weiss was last seen on 09/20/2019.  Today is day 6 cycle 1 of Cisplatin & Aloxi.   Overall, she tells me she has been feeling pretty well. She notes that she has been having a significant time eating and drinking. She states that with as little as she knows she has ben able to eat, she tries to get more calories in with Boost than trying to eat other foods. She states that she is also trying to drink water, but that it's just as hard to drink fluid as it is to eat.    REVIEW OF SYSTEMS:  Review of Systems  Constitutional: Positive for appetite change (no appetite), fatigue (no energy) and unexpected weight change.  HENT:   Positive for sore throat and trouble swallowing (w/ pills).   Eyes: Negative.   Respiratory: Negative.   Cardiovascular: Negative.   Gastrointestinal: Positive for vomiting.  Endocrine: Negative.   Genitourinary: Negative.    Musculoskeletal: Negative.   Skin: Negative.   Neurological: Negative.   Hematological: Negative.   Psychiatric/Behavioral: Negative.   All other systems reviewed and are negative.   PAST MEDICAL/SURGICAL HISTORY:  Past Medical History:  Diagnosis Date  . Anxiety about health 08/23/2019  . Arthritis   . GERD (gastroesophageal reflux disease)   . Hypothyroidism   . Hypothyroidism (acquired) 08/23/2019  . 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 Port-A-Cath in place 08/09/2019  . 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 Scoliosis   . Sinusitis   . 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   Past Surgical History:  Procedure Laterality Date  . NO PAST SURGERIES    . No prior surgery    . PORTACATH PLACEMENT Right 08/01/2019   Procedure: INSERTION PORT-A-CATH;  Surgeon: Aviva Signs, MD;  Location: [facility];  Service: General;  Laterality: Right;    SOCIAL  HISTORY:  Social History   Socioeconomic History  . Marital status: Widowed    Spouse name: Not on file  . Number of children: 1  . Years of education: Not on file  . Highest education level: Not on file  Occupational History  . Occupation: RETIRED  Tobacco Use  . Smoking status: Former Research scientist (life sciences)  . Smokeless tobacco: Never Used  . Tobacco comment: some in her 20s and 30s  Vaping Use  . Vaping Use: Never used  Substance and Sexual Activity  . Alcohol use: Yes    Comment: on rare occasion "like twice a year"  . Drug use: Never  . Sexual activity: Not Currently  Other Topics Concern  . Not on file  Social History Narrative   Lives at home alone   Retired   Widow   Caffeine: about 4 cups coffee, 3 glasses of tea daily   Social Determinants of Health   Financial Resource Strain: Low Risk   . Difficulty of Paying Living Expenses: Not hard at all  Food Insecurity: No Food Insecurity  . Worried About Charity fundraiser in the Last Year: Never true  . Ran Out of Food in the Last Year: Never true  Transportation Needs: No Transportation Needs  . Lack of Transportation (Medical): No  . Lack of Transportation (Non-Medical): No  Physical Activity: Sufficiently Active  . Days of Exercise per Week: 5 days  . Minutes of Exercise per Session: 30 min  Stress: Stress Concern Present  . Feeling of Stress : Very much  Social Connections:   . Frequency of Communication with Friends and Family:   . Frequency of Social Gatherings with Friends and Family:   . Attends Religious Services:   . Active Member of Clubs or Organizations:   . Attends Archivist Meetings:   Marland Kitchen Marital Status:   Intimate Partner Violence: Not At Risk  . Fear of Current or Ex-Partner: No  . Emotionally Abused: No  . Physically Abused: No  . Sexually Abused: No    FAMILY HISTORY:  Family History  Problem Relation Age of Onset  . Lung cancer Mother        smoker  . Cancer Mother        Throat  .  Lung cancer Father        smoker  . Diabetes Father   . CVA Brother   . Diabetes Brother   . Diabetes Maternal Grandmother   . Heart attack Paternal Grandfather   . Migraines Neg Hx   . Headache Neg Hx     CURRENT MEDICATIONS:  Current Outpatient Medications  Medication Sig Dispense Refill  . butalbital-acetaminophen-caffeine (FIORICET) 50-325-40 MG tablet Take 1-2 tablets by mouth every 6 (six) hours as needed for headache (severe). 10 tablet 0  . ciprofloxacin (CIPRO) 500 MG/5ML (10%) suspension Take 5 mLs (500 mg total) by mouth 2 (two) times daily. 50 mL 0  . CISplatin in sodium chloride 0.9 % 250 mL Inject 40 mg/m2 into the vein once a week.    . esomeprazole ([place]) 40  MG capsule Take 40 mg by mouth daily.     Marland Kitchen levothyroxine (SYNTHROID) 50 MCG tablet Take 50 mcg by mouth daily. BRAND    . nystatin (MYCOSTATIN) 100000 UNIT/ML suspension     . PREVIDENT 5000 BOOSTER PLUS 1.1 % PSTE APPLY WITH TOOTHBRUSH AT BEDTIME    . diphenhydrAMINE (BENADRYL) 25 mg capsule Take 25 mg by mouth at bedtime as needed.  (Patient not taking: Reported on 09/25/2019)    . ibuprofen (ADVIL) 200 MG tablet Take 400-600 mg by mouth every 8 (eight) hours as needed for moderate pain.  (Patient not taking: Reported on 09/25/2019)    . lidocaine (XYLOCAINE) 2 % solution 10 mLs every 4 (four) hours as needed.  (Patient not taking: Reported on 09/25/2019)    . lidocaine-prilocaine (EMLA) cream Apply a small amount to port a cath site and cover with plastic wrap 1 hour prior to chemotherapy appointments (Patient not taking: Reported on 09/25/2019) 30 g 3  . magic mouthwash SOLN Take 5 mLs by mouth 3 (three) times daily as needed for mouth pain. (Patient not taking: Reported on 09/25/2019) 15 mL 6  . ondansetron (ZOFRAN ODT) 8 MG disintegrating tablet Take 1 tablet (8 mg total) by mouth every 8 (eight) hours as needed for nausea or vomiting. (Patient not taking: Reported on 09/25/2019) 45 tablet 2  . prochlorperazine  (COMPAZINE) 10 MG tablet Take 1 tablet (10 mg total) by mouth every 6 (six) hours as needed (Nausea or vomiting). (Patient not taking: Reported on 09/25/2019) 30 tablet 1  . scopolamine (TRANSDERM-SCOP) 1 MG/3DAYS Place 1 patch (1.5 mg total) onto the skin every 3 (three) days. (Patient not taking: Reported on 09/25/2019) 10 patch 12   No current facility-administered medications for this visit.    ALLERGIES:  Allergies  Allergen Reactions  . Sulfamethoxazole-Trimethoprim Other (See Comments)    Headaches  . Penicillins Rash    20 years    PHYSICAL EXAM:  Performance status (ECOG): 1 - Symptomatic but completely ambulatory  Vitals:   09/25/19 0852  BP: (!) 147/70  Pulse: 81  Resp: 18  Temp: (!) 97.5 F (36.4 C)  SpO2: 98%   Wt Readings from Last 3 Encounters:  09/25/19 195 lb (88.5 kg)  09/20/19 199 lb 1.2 oz (90.3 kg)  09/18/19 200 lb 9.6 oz (91 kg)   Physical Exam Vitals and nursing note reviewed.  Constitutional:      Appearance: Normal appearance.  HENT:     Mouth/Throat:     Mouth: Mucous membranes are moist.  Eyes:     Pupils: Pupils are equal, round, and reactive to light.  Cardiovascular:     Rate and Rhythm: Normal rate and regular rhythm.     Pulses: Normal pulses.     Heart sounds: Normal heart sounds.  Pulmonary:     Effort: Pulmonary effort is normal.     Breath sounds: Normal breath sounds.  Abdominal:     Palpations: Abdomen is soft. There is no mass.     Tenderness: There is no abdominal tenderness.  Musculoskeletal:     Cervical back: Neck supple.     Right lower leg: No edema.     Left lower leg: No edema.  Neurological:     Mental Status: She is alert and oriented to person, place, and time.  Psychiatric:        Mood and Affect: Mood normal.        Behavior: Behavior normal.     LABORATORY DATA:  I have reviewed the labs as listed.  CBC Latest Ref Rng & Units 09/18/2019 09/13/2019 09/06/2019  WBC 4.0 - 10.5 K/uL 5.8 4.9 5.0  Hemoglobin  12.0 - 15.0 g/dL 11.5(L) 11.6(L) 12.2  Hematocrit 36 - 46 % 34.2(L) 35.6(L) 37.9  Platelets 150 - 400 K/uL 136(L) 191 241   CMP Latest Ref Rng & Units 09/18/2019 09/13/2019 09/06/2019  Glucose 70 - 99 mg/dL 122(H) 112(H) 125(H)  BUN 8 - 23 mg/dL 12 14 11   Creatinine 0.44 - 1.00 mg/dL 0.85 0.81 0.79  Sodium 135 - 145 mmol/L 133(L) 136 137  Potassium 3.5 - 5.1 mmol/L 4.0 4.1 4.2  Chloride 98 - 111 mmol/L 95(L) 98 101  CO2 22 - 32 mmol/L 28 28 27   Calcium 8.9 - 10.3 mg/dL 8.3(L) 8.8(L) 8.7(L)  Total Protein 6.5 - 8.1 g/dL 6.6 6.5 6.5  Total Bilirubin 0.3 - 1.2 mg/dL 0.8 0.6 0.7  Alkaline Phos 38 - 126 U/L 87 85 91  AST 15 - 41 U/L 21 20 20   ALT 0 - 44 U/L 23 25 24     DIAGNOSTIC IMAGING:  I have independently reviewed the scans and discussed with the patient. No results found.   ASSESSMENT:  1.Squamous cell carcinoma, p16 positiveof the left tonsil: - 06/29/2019 Biopsy A. LYMPH NODE, LEFT NECK, NEEDLE CORE BIOPSY:  - Squamous cell carcinoma, p16 positive.  COMMENT:  The carcinoma is positive with p16, p40, cytokeratin 5/6 and p63 consistent with squamous cell carcinoma. The carcinoma is negative with Epstein-Barr virus (EBV), TTF-1 and Napsin-A. -PET scan on 07/18/2019 showed left level 2A lymph node, 1 cm. Small rounded lymph node 8 mm at level 2B on the left side. Intense palatine tonsillar uptake with asymmetric fullness of tonsillar tissue and symmetrical FDG activity with masslike changes on the left side measuring 2.2 x 1.7 cm compared to the right. SUV 16.2. Small focus of increased activity in the right neck adjacent to or within the small nodule in the right hemithyroid. -An ultrasound of the thyroid gland showed heterogeneous thyroid with no masses. -She was evaluated by Dr.Yanagihara. -XRT with weekly cisplatin started on 08/16/2019.   PLAN:  1. 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  In  view of her continued weakness and weight loss, I will hold her week 7 of cisplatin.  She will finish XRT on 09/28/2019. -Creatinine increased to 1.03 today.  She will receive IV fluids with electrolytes today, Thursday and Friday. -We will reevaluate her in 1 week.  2. Hypothyroidism: -Continue Synthroid 50 mcg daily.  3.Throat pain: -She reported more pain on the left side of the throat.  Continue Magic mouthwash with lidocaine.  4. Weight loss: -She lost total of 22 pounds since the start of therapy.  Since last week she lost 4 pounds. -I have encouraged her to increase her caloric intake by consuming more boost/Ensure.  5.  Nausea/vomiting: -She could not tolerate scopolamine patch as it right upper mouth. -She is using Zofran ODT and Compazine which is helping her symptoms.  6.  Hypomagnesemia: -Magnesium level today is 1.2.  Potassium is 3.4.  We will replete magnesium with IV.  I am not starting her on any pills as she is not able to swallow.   Orders placed this encounter:  No orders of the defined types were placed in this encounter.    Derek Jack, MD [facility] [phone]   I,  [employer], am acting as a Education administrator for Dr. Sanda Linger.  I, Derek Jack MD, have reviewed the above documentation for accuracy and completeness, and I agree with the above.

## 2019-09-26 DIAGNOSIS — C090 Malignant neoplasm of tonsillar fossa: Secondary | ICD-10-CM | POA: Diagnosis not present

## 2019-09-27 ENCOUNTER — Ambulatory Visit (HOSPITAL_COMMUNITY): Payer: PPO | Admitting: Hematology

## 2019-09-27 ENCOUNTER — Other Ambulatory Visit (HOSPITAL_COMMUNITY): Payer: PPO

## 2019-09-27 ENCOUNTER — Ambulatory Visit (HOSPITAL_COMMUNITY): Payer: PPO

## 2019-09-27 DIAGNOSIS — C090 Malignant neoplasm of tonsillar fossa: Secondary | ICD-10-CM | POA: Diagnosis not present

## 2019-09-28 ENCOUNTER — Encounter (HOSPITAL_COMMUNITY): Payer: Self-pay

## 2019-09-28 ENCOUNTER — Other Ambulatory Visit: Payer: Self-pay

## 2019-09-28 ENCOUNTER — Inpatient Hospital Stay (HOSPITAL_COMMUNITY): Payer: PPO

## 2019-09-28 VITALS — BP 136/78 | HR 72 | Temp 97.0°F | Resp 18

## 2019-09-28 DIAGNOSIS — E860 Dehydration: Secondary | ICD-10-CM

## 2019-09-28 DIAGNOSIS — C099 Malignant neoplasm of tonsil, unspecified: Secondary | ICD-10-CM

## 2019-09-28 DIAGNOSIS — Z5111 Encounter for antineoplastic chemotherapy: Secondary | ICD-10-CM | POA: Diagnosis not present

## 2019-09-28 DIAGNOSIS — C090 Malignant neoplasm of tonsillar fossa: Secondary | ICD-10-CM | POA: Diagnosis not present

## 2019-09-28 MED ORDER — SODIUM CHLORIDE 0.9% FLUSH
10.0000 mL | Freq: Once | INTRAVENOUS | Status: AC
  Administered 2019-09-28: 10 mL via INTRAVENOUS

## 2019-09-28 MED ORDER — HEPARIN SOD (PORK) LOCK FLUSH 100 UNIT/ML IV SOLN
500.0000 [IU] | Freq: Once | INTRAVENOUS | Status: AC
  Administered 2019-09-28: 500 [IU] via INTRAVENOUS

## 2019-09-28 MED ORDER — SODIUM CHLORIDE 0.9 % IV SOLN
Freq: Once | INTRAVENOUS | Status: AC
  Filled 2019-09-28: qty 1000

## 2019-09-28 NOTE — Patient Instructions (Signed)
[facility] at [facility]  Discharge Instructions:   _______________________________________________________________  Thank you for choosing [facility] at [facility] to provide your oncology and hematology care.  To afford each patient quality time with our providers, please arrive at least 15 minutes before your scheduled appointment.  You need to re-schedule your appointment if you arrive 10 or more minutes late.  We strive to give you quality time with our providers, and arriving late affects you and other patients whose appointments are after yours.  Also, if you no show three or more times for appointments you may be dismissed from the clinic.  Again, thank you for choosing [facility] at [facility]. Our hope is that these requests will allow you access to exceptional care and in a timely manner. _______________________________________________________________  If you have questions after your visit, please contact our office at [phone] between the hours of 8:30 a.m. and 5:00 p.m. Voicemails left after 4:30 p.m. will not be returned until the following business day. _______________________________________________________________  For prescription refill requests, have your pharmacy contact our office. _______________________________________________________________  Recommendations made by the consultant and any test results will be sent to your referring physician. _______________________________________________________________ 

## 2019-09-28 NOTE — Progress Notes (Signed)
Patient presents today for hydration. Vital signs are stable. Patient has no complaints of any significant changes. Patient has pain which is ongoing in the left side of her throat. 6/10. Patient teaching performed. Understanding verbalized.   Verbal instructions from Dr. Delton Coombes. House fluids today only. Dr. Delton Coombes will discuss one more cycle of treatment next week with radiation therapy if patient feels well due to radiation physician added one more week of radiation per patient's words.   Hydration fluids given today per MD orders. Tolerated infusion without adverse affects. Vital signs stable. No complaints at this time. Discharged from clinic ambulatory. F/U with [facility] as scheduled.

## 2019-09-29 ENCOUNTER — Ambulatory Visit (HOSPITAL_COMMUNITY): Payer: PPO

## 2019-09-29 ENCOUNTER — Inpatient Hospital Stay (HOSPITAL_COMMUNITY): Payer: PPO

## 2019-09-29 DIAGNOSIS — E7849 Other hyperlipidemia: Secondary | ICD-10-CM | POA: Diagnosis not present

## 2019-09-29 DIAGNOSIS — C090 Malignant neoplasm of tonsillar fossa: Secondary | ICD-10-CM | POA: Diagnosis not present

## 2019-09-29 DIAGNOSIS — E063 Autoimmune thyroiditis: Secondary | ICD-10-CM | POA: Diagnosis not present

## 2019-09-29 DIAGNOSIS — I10 Essential (primary) hypertension: Secondary | ICD-10-CM | POA: Diagnosis not present

## 2019-09-29 NOTE — Progress Notes (Signed)
Nutrition Follow-up:  Patient with left tonsillar cancer, p 16 positive.  Patient receiving concurrent chemotherapy and radiation therapy.  Last radiation treatment due 8/5.  Chemo held this week.   Spoke with patient via phone.  Patient reports that she is not taking in enough nutrition due to throat pain, no taste.  Reports that she has been able to get in 1 boost soothe (300 calories, 10 g protein) and 1 chocolate [employer] shake (330 calories, 16 gm protein) daily and small amount of soup due to pain.  Taking ibuprofen for pain.  Does not want to take pain medications as husband became addicted.  Reports that she is taking nausea medications.    Patient was not able to come in for fluids today due to having grandson.      Medications: reviewed  Labs: creatinine 1.03, K 3.4, Mag 1.2  Anthropometrics:   Weight 195 lb on 7/26 decreased from 199 lb on 7/23.    212 lb 14.4 oz on 6/23 213 5/27 UBW  8% weight loss in 2 months, significant  NUTRITION DIAGNOSIS: predicted suboptimal energy intake continues    INTERVENTION:  Patient not meeting nutritional needs and has significant weight loss.  Patient with one more week of treatment with side effects to linger well after treatment is over.  Recommend considering feeding tube placement with limited intake, significant weight loss. Message sent to provider Will provide additional samples of [employer] [place] chocolate per patient request.  Encouraged patient to push calories and protein.  Patient has contact information     MONITORING, EVALUATION, GOAL: weight trends, intake   NEXT VISIT: August 13, phone f/u  Antrell Tipler B. Zenia Resides, [place], [place] Registered Dietitian [phone] (mobile)

## 2019-10-02 ENCOUNTER — Inpatient Hospital Stay (HOSPITAL_COMMUNITY): Payer: PPO

## 2019-10-02 ENCOUNTER — Other Ambulatory Visit: Payer: Self-pay

## 2019-10-02 ENCOUNTER — Inpatient Hospital Stay (HOSPITAL_COMMUNITY): Payer: PPO | Attending: Hematology | Admitting: Hematology

## 2019-10-02 VITALS — BP 123/75 | HR 80 | Temp 97.0°F | Resp 18 | Wt 194.7 lb

## 2019-10-02 VITALS — BP 127/74 | HR 73 | Temp 96.8°F | Resp 17

## 2019-10-02 DIAGNOSIS — R634 Abnormal weight loss: Secondary | ICD-10-CM | POA: Diagnosis not present

## 2019-10-02 DIAGNOSIS — Z5111 Encounter for antineoplastic chemotherapy: Secondary | ICD-10-CM | POA: Diagnosis not present

## 2019-10-02 DIAGNOSIS — C090 Malignant neoplasm of tonsillar fossa: Secondary | ICD-10-CM | POA: Diagnosis not present

## 2019-10-02 DIAGNOSIS — R112 Nausea with vomiting, unspecified: Secondary | ICD-10-CM | POA: Diagnosis not present

## 2019-10-02 DIAGNOSIS — C099 Malignant neoplasm of tonsil, unspecified: Secondary | ICD-10-CM

## 2019-10-02 DIAGNOSIS — E039 Hypothyroidism, unspecified: Secondary | ICD-10-CM | POA: Insufficient documentation

## 2019-10-02 DIAGNOSIS — Z79899 Other long term (current) drug therapy: Secondary | ICD-10-CM | POA: Insufficient documentation

## 2019-10-02 DIAGNOSIS — E860 Dehydration: Secondary | ICD-10-CM

## 2019-10-02 LAB — CBC WITH DIFFERENTIAL/PLATELET
Abs Immature Granulocytes: 0 10*3/uL (ref 0.00–0.07)
Basophils Absolute: 0 10*3/uL (ref 0.0–0.1)
Basophils Relative: 0 %
Eosinophils Absolute: 0 10*3/uL (ref 0.0–0.5)
Eosinophils Relative: 1 %
HCT: 28.4 % — ABNORMAL LOW (ref 36.0–46.0)
Hemoglobin: 9.5 g/dL — ABNORMAL LOW (ref 12.0–15.0)
Immature Granulocytes: 0 %
Lymphocytes Relative: 20 %
Lymphs Abs: 0.3 10*3/uL — ABNORMAL LOW (ref 0.7–4.0)
MCH: 30.5 pg (ref 26.0–34.0)
MCHC: 33.5 g/dL (ref 30.0–36.0)
MCV: 91.3 fL (ref 80.0–100.0)
Monocytes Absolute: 0.2 10*3/uL (ref 0.1–1.0)
Monocytes Relative: 11 %
Neutro Abs: 1.1 10*3/uL — ABNORMAL LOW (ref 1.7–7.7)
Neutrophils Relative %: 68 %
Platelets: 164 10*3/uL (ref 150–400)
RBC: 3.11 MIL/uL — ABNORMAL LOW (ref 3.87–5.11)
RDW: 13.2 % (ref 11.5–15.5)
WBC: 1.6 10*3/uL — ABNORMAL LOW (ref 4.0–10.5)
nRBC: 0 % (ref 0.0–0.2)

## 2019-10-02 LAB — COMPREHENSIVE METABOLIC PANEL
ALT: 16 U/L (ref 0–44)
AST: 15 U/L (ref 15–41)
Albumin: 3.7 g/dL (ref 3.5–5.0)
Alkaline Phosphatase: 82 U/L (ref 38–126)
Anion gap: 11 (ref 5–15)
BUN: 20 mg/dL (ref 8–23)
CO2: 29 mmol/L (ref 22–32)
Calcium: 8.8 mg/dL — ABNORMAL LOW (ref 8.9–10.3)
Chloride: 96 mmol/L — ABNORMAL LOW (ref 98–111)
Creatinine, Ser: 1.22 mg/dL — ABNORMAL HIGH (ref 0.44–1.00)
GFR calc Af Amer: 51 mL/min — ABNORMAL LOW (ref >60)
GFR calc non Af Amer: 44 mL/min — ABNORMAL LOW (ref >60)
Glucose, Bld: 112 mg/dL — ABNORMAL HIGH (ref 70–99)
Potassium: 3.7 mmol/L (ref 3.5–5.1)
Sodium: 136 mmol/L (ref 135–145)
Total Bilirubin: 0.7 mg/dL (ref 0.3–1.2)
Total Protein: 6.2 g/dL — ABNORMAL LOW (ref 6.5–8.1)

## 2019-10-02 LAB — MAGNESIUM: Magnesium: 1.5 mg/dL — ABNORMAL LOW (ref 1.7–2.4)

## 2019-10-02 MED ORDER — SODIUM CHLORIDE 0.9 % IV SOLN
Freq: Once | INTRAVENOUS | Status: AC
  Filled 2019-10-02: qty 1000

## 2019-10-02 MED ORDER — HEPARIN SOD (PORK) LOCK FLUSH 100 UNIT/ML IV SOLN
500.0000 [IU] | Freq: Once | INTRAVENOUS | Status: AC | PRN
  Administered 2019-10-02: 500 [IU]

## 2019-10-02 MED ORDER — SODIUM CHLORIDE 0.9% FLUSH
10.0000 mL | Freq: Once | INTRAVENOUS | Status: AC | PRN
  Administered 2019-10-02: 10 mL

## 2019-10-02 NOTE — Patient Instructions (Signed)
[facility] at [facility]  Discharge Instructions:   _______________________________________________________________  Thank you for choosing [facility] at [facility] to provide your oncology and hematology care.  To afford each patient quality time with our providers, please arrive at least 15 minutes before your scheduled appointment.  You need to re-schedule your appointment if you arrive 10 or more minutes late.  We strive to give you quality time with our providers, and arriving late affects you and other patients whose appointments are after yours.  Also, if you no show three or more times for appointments you may be dismissed from the clinic.  Again, thank you for choosing [facility]. Our hope is that these requests will allow you access to exceptional care and in a timely manner. _______________________________________________________________  If you have questions after your visit, please contact our office at [phone] between the hours of 8:30 a.m. and 5:00 p.m. Voicemails left after 4:30 p.m. will not be returned until the following business day. _______________________________________________________________  For prescription refill requests, have your pharmacy contact our office. _______________________________________________________________  Recommendations made by the consultant and any test results will be sent to your referring physician. _______________________________________________________________ 

## 2019-10-02 NOTE — Patient Instructions (Signed)
[place] at [facility] Discharge Instructions  You were seen today by Dr. Delton Coombes. He went over your recent results. You received fluids today. Please drink 3-4 cans of Boost Plus daily to increase your weight. Dr. Delton Coombes will see you back in 1 week for labs and follow up.   Thank you for choosing [place] at [facility] to provide your oncology and hematology care.  To afford each patient quality time with our provider, please arrive at least 15 minutes before your scheduled appointment time.   If you have a lab appointment with the [facility] please come in thru the Main Entrance and check in at the main information desk  You need to re-schedule your appointment should you arrive 10 or more minutes late.  We strive to give you quality time with our providers, and arriving late affects you and other patients whose appointments are after yours.  Also, if you no show three or more times for appointments you may be dismissed from the clinic at the providers discretion.     Again, thank you for choosing [facility].  Our hope is that these requests will decrease the amount of time that you wait before being seen by our physicians.       _____________________________________________________________  Should you have questions after your visit to [facility], please contact our office at [phone] between the hours of 8:00 a.m. and 4:30 p.m.  Voicemails left after 4:00 p.m. will not be returned until the following business day.  For prescription refill requests, have your pharmacy contact our office and allow 72 hours.    Cancer Center Support Programs:   > Cancer Support Group  2nd Tuesday of the month 1pm-2pm, Journey Room

## 2019-10-02 NOTE — Progress Notes (Signed)
Pt here today for house fluids.

## 2019-10-02 NOTE — Progress Notes (Signed)
[place] tolerated house fluids today.  Discharged ambulatory.

## 2019-10-02 NOTE — Progress Notes (Signed)
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[place])   07/26/2019 Cancer Staging   Staging form: Pharynx - HPV-Mediated Oropharynx, AJCC 8th Edition - Clinical stage from 07/26/2019: Stage I (cT2, cN1, cM0, p16+) - Signed by Derek Jack, MD on 07/26/2019   08/16/2019 -  Chemotherapy   The patient had palonosetron (ALOXI) injection 0.25 mg, 0.25 mg, Intravenous,  Once, 6 of 7 cycles Administration: 0.25 mg (08/16/2019), 0.25 mg (08/23/2019), 0.25 mg (08/30/2019), 0.25 mg (09/06/2019), 0.25 mg (09/13/2019), 0.25 mg (09/20/2019) CISplatin (PLATINOL) 84 mg in sodium chloride 0.9 % 250 mL chemo infusion, 40 mg/m2 = 84 mg, Intravenous,  Once, 6 of 7 cycles Administration: 84 mg (08/16/2019), 84 mg (08/23/2019), 84 mg (08/30/2019), 84 mg (09/06/2019), 84 mg (09/13/2019), 84 mg (09/20/2019) fosaprepitant (EMEND) 150 mg in sodium chloride 0.9 % 145 mL IVPB, 150 mg, Intravenous,  Once, 6 of 7 cycles Administration: 150 mg (08/16/2019), 150 mg (08/23/2019), 150 mg (08/30/2019), 150 mg (09/06/2019), 150 mg (09/13/2019), 150 mg (09/20/2019)  for chemotherapy treatment.      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, a 74 y.o. female, returns for routine follow-up and consideration for next cycle of  chemotherapy. Kristi Weiss was last seen on 09/25/2019.  Today she reports that she drinks 2 cans of high-calorie Boost on average daily. She reports vomiting the second can on 10/01/2019 due to discomfort and pain in her anterior throat which led to nausea and vomiting. She takes children's ibuprofen which she takes prior to drinking the shakes and is tolerating it better. She is taking Compazine and Zofran as needed. She is having radiation treatment this week Monday through Thursday.   REVIEW OF SYSTEMS:  Review of Systems  Constitutional: Positive for appetite change (severely decreased) and fatigue (severe).  HENT:   Positive for sore throat (sore & pain in anterior throat) and trouble swallowing.   Gastrointestinal: Positive for constipation, nausea and vomiting.  Neurological: Positive for headaches.  Psychiatric/Behavioral: Positive for depression.  All other systems reviewed and are negative.   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   Past Surgical History:  Procedure Laterality Date   NO PAST SURGERIES     No prior surgery     PORTACATH PLACEMENT Right  08/01/2019   Procedure: INSERTION PORT-A-CATH;  Surgeon: Aviva Signs, MD;  Location: [facility];  Service: General;  Laterality: Right;    SOCIAL HISTORY:  Social History   Socioeconomic History   Marital status: Widowed    Spouse name: Not on file   Number of children: 1   Years of  education: Not on file   Highest education level: Not on file  Occupational History   Occupation: RETIRED  Tobacco Use   Smoking status: Former Smoker   Smokeless tobacco: Never Used   Tobacco comment: some in her 65s and 28s  Vaping Use   Vaping Use: Never used  Substance and Sexual Activity   Alcohol use: Yes    Comment: on rare occasion "like twice a year"   Drug use: Never   Sexual activity: Not Currently  Other Topics Concern   Not on file  Social History Narrative   Lives at home alone   Retired   Widow   Caffeine: about 4 cups coffee, 3 glasses of tea daily   Social Determinants of Radio broadcast assistant Strain: Low Risk    Difficulty of Paying Living Expenses: Not hard at all  Food Insecurity: No Food Insecurity   Worried About Charity fundraiser in the Last Year: Never true   Arboriculturist in the Last Year: Never true  Transportation Needs: No Transportation Needs   Lack of Transportation (Medical): No   Lack of Transportation (Non-Medical): No  Physical Activity: Sufficiently Active   Days of Exercise per Week: 5 days   Minutes of Exercise per Session: 30 min  Stress: Stress Concern Present   Feeling of Stress : Very much  Social Connections:    Frequency of Communication with Friends and Family:    Frequency of Social Gatherings with Friends and Family:    Attends Religious Services:    Active Member of Clubs or Organizations:    Attends Music therapist:    Marital Status:   Intimate Partner Violence: Not At Risk   Fear of Current or Ex-Partner: No   Emotionally Abused: No   Physically Abused: No   Sexually Abused: No    FAMILY HISTORY:  Family History  Problem Relation Age of Onset   Lung cancer Mother        smoker   Cancer Mother        Throat   Lung cancer Father        smoker   Diabetes Father    CVA Brother    Diabetes Brother    Diabetes Maternal Grandmother    Heart attack  Paternal Grandfather    Migraines Neg Hx    Headache Neg Hx     CURRENT MEDICATIONS:  Current Outpatient Medications  Medication Sig Dispense Refill   CISplatin in sodium chloride 0.9 % 250 mL Inject 40 mg/m2 into the vein once a week.     esomeprazole (NEXIUM) 40 MG capsule Take 40 mg by mouth daily.      levothyroxine (SYNTHROID) 50 MCG tablet Take 50 mcg by mouth daily. BRAND     nystatin (MYCOSTATIN) 100000 UNIT/ML suspension      PREVIDENT 5000 BOOSTER PLUS 1.1 % PSTE APPLY WITH TOOTHBRUSH AT BEDTIME     scopolamine (TRANSDERM-SCOP) 1 MG/3DAYS Place 1 patch (1.5 mg total) onto the skin every 3 (three) days. 10 patch 12   butalbital-acetaminophen-caffeine (FIORICET) 50-325-40 MG tablet Take 1-2 tablets by mouth every 6 (six) hours as  needed for headache (severe). (Patient not taking: Reported on 10/02/2019) 10 tablet 0   diphenhydrAMINE (BENADRYL) 25 mg capsule Take 25 mg by mouth at bedtime as needed.  (Patient not taking: Reported on 10/02/2019)     ibuprofen (ADVIL) 200 MG tablet Take 400-600 mg by mouth every 8 (eight) hours as needed for moderate pain.  (Patient not taking: Reported on 10/02/2019)     lidocaine (XYLOCAINE) 2 % solution 10 mLs every 4 (four) hours as needed.  (Patient not taking: Reported on 10/02/2019)     lidocaine-prilocaine (EMLA) cream Apply a small amount to port a cath site and cover with plastic wrap 1 hour prior to chemotherapy appointments (Patient not taking: Reported on 10/02/2019) 30 g 3   magic mouthwash SOLN Take 5 mLs by mouth 3 (three) times daily as needed for mouth pain. (Patient not taking: Reported on 10/02/2019) 15 mL 6   ondansetron (ZOFRAN ODT) 8 MG disintegrating tablet Take 1 tablet (8 mg total) by mouth every 8 (eight) hours as needed for nausea or vomiting. (Patient not taking: Reported on 10/02/2019) 45 tablet 2   prochlorperazine (COMPAZINE) 10 MG tablet Take 1 tablet (10 mg total) by mouth every 6 (six) hours as needed (Nausea or  vomiting). (Patient not taking: Reported on 10/02/2019) 30 tablet 1   No current facility-administered medications for this visit.    ALLERGIES:  Allergies  Allergen Reactions   Sulfamethoxazole-Trimethoprim Other (See Comments)    Headaches   Penicillins Rash    20 years    PHYSICAL EXAM:  Performance status (ECOG): 1 - Symptomatic but completely ambulatory  Vitals:   10/02/19 0827  BP: 123/75  Pulse: 80  Resp: 18  Temp: (!) 97 F (36.1 C)  SpO2: 94%   Wt Readings from Last 3 Encounters:  10/02/19 194 lb 11.2 oz (88.3 kg)  09/25/19 195 lb (88.5 kg)  09/20/19 199 lb 1.2 oz (90.3 kg)   Physical Exam Vitals reviewed.  Constitutional:      Appearance: Normal appearance. She is obese.  Chest:     Comments: Port-a-Cath on R chest Neurological:     General: No focal deficit present.     Mental Status: She is alert and oriented to person, place, and time.  Psychiatric:        Mood and Affect: Mood normal.        Behavior: Behavior normal.     LABORATORY DATA:  I have reviewed the labs as listed.  CBC Latest Ref Rng & Units 10/02/2019 09/25/2019 09/18/2019  WBC 4.0 - 10.5 K/uL 1.6(L) 3.0(L) 5.8  Hemoglobin 12.0 - 15.0 g/dL 9.5(L) 10.4(L) 11.5(L)  Hematocrit 36 - 46 % 28.4(L) 31.0(L) 34.2(L)  Platelets 150 - 400 K/uL 164 128(L) 136(L)   CMP Latest Ref Rng & Units 09/25/2019 09/18/2019 09/13/2019  Glucose 70 - 99 mg/dL 112(H) 122(H) 112(H)  BUN 8 - 23 mg/dL 16 12 14   Creatinine 0.44 - 1.00 mg/dL 1.03(H) 0.85 0.81  Sodium 135 - 145 mmol/L 136 133(L) 136  Potassium 3.5 - 5.1 mmol/L 3.4(L) 4.0 4.1  Chloride 98 - 111 mmol/L 95(L) 95(L) 98  CO2 22 - 32 mmol/L 28 28 28   Calcium 8.9 - 10.3 mg/dL 8.2(L) 8.3(L) 8.8(L)  Total Protein 6.5 - 8.1 g/dL 6.4(L) 6.6 6.5  Total Bilirubin 0.3 - 1.2 mg/dL 0.8 0.8 0.6  Alkaline Phos 38 - 126 U/L 80 87 85  AST 15 - 41 U/L 21 21 20   ALT 0 - 44 U/L 22  23 25    DIAGNOSTIC IMAGING:  I have independently reviewed the scans and discussed  with the patient. No results found.   ASSESSMENT:  1.Squamous cell carcinoma, p16 positiveof the left tonsil: - 06/29/2019 Biopsy A. LYMPH NODE, LEFT NECK, NEEDLE CORE BIOPSY:  - Squamous cell carcinoma, p16 positive.  COMMENT:  The carcinoma is positive with p16, p40, cytokeratin 5/6 and p63 consistent with squamous cell carcinoma. The carcinoma is negative with Epstein-Barr virus (EBV), TTF-1 and Napsin-A. -PET scan on 07/18/2019 showed left level 2A lymph node, 1 cm. Small rounded lymph node 8 mm at level 2B on the left side. Intense palatine tonsillar uptake with asymmetric fullness of tonsillar tissue and symmetrical FDG activity with masslike changes on the left side measuring 2.2 x 1.7 cm compared to the right. SUV 16.2. Small focus of increased activity in the right neck adjacent to or within the small nodule in the right hemithyroid. -An ultrasound of the thyroid gland showed heterogeneous thyroid with no masses. -She was evaluated by Dr.Yanagihara. -XRT with weekly cisplatin started on 08/16/2019.  Week 6 of cisplatin on 09/20/2019.   PLAN:  1. 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 -Today her white count is 1.6 with ANC of 1100.  She will complete radiation therapy on 10/05/2019. -We will give her hydration daily this week.  We will reevaluate her in 1 week.  No chemotherapy today.  2. Hypothyroidism: -Continue Synthroid 50 mcg daily.  3.Throat pain: -She is taking liquid ibuprofen which is helping.  She does not want any narcotic at this time.  4. Weight loss: -She lost 23 pounds since the start of therapy.  We talked about feeding tube placement.  Patient does not want to do at this time.  She reports that she will increase her boost plus to 2 to 3 cans/day.  5. Nausea/vomiting: -Continue Zofran ODT and Compazine as needed.  6.   Hypomagnesemia: -Magnesium today is 1.5.  We will give IV magnesium.   Orders placed this encounter:  No orders of the defined types were placed in this encounter.    Derek Jack, MD [place] [phone]   I, Milinda Antis, am acting as a scribe for Dr. Sanda Linger.  I, Derek Jack MD, have reviewed the above documentation for accuracy and completeness, and I agree with the above.

## 2019-10-03 ENCOUNTER — Inpatient Hospital Stay (HOSPITAL_COMMUNITY): Payer: PPO

## 2019-10-03 VITALS — BP 141/58 | HR 63 | Temp 97.2°F | Resp 18

## 2019-10-03 DIAGNOSIS — E860 Dehydration: Secondary | ICD-10-CM

## 2019-10-03 DIAGNOSIS — C099 Malignant neoplasm of tonsil, unspecified: Secondary | ICD-10-CM

## 2019-10-03 DIAGNOSIS — C090 Malignant neoplasm of tonsillar fossa: Secondary | ICD-10-CM | POA: Diagnosis not present

## 2019-10-03 DIAGNOSIS — Z5111 Encounter for antineoplastic chemotherapy: Secondary | ICD-10-CM | POA: Diagnosis not present

## 2019-10-03 MED ORDER — HEPARIN SOD (PORK) LOCK FLUSH 100 UNIT/ML IV SOLN
500.0000 [IU] | Freq: Once | INTRAVENOUS | Status: AC | PRN
  Administered 2019-10-03: 500 [IU]

## 2019-10-03 MED ORDER — SODIUM CHLORIDE 0.9 % IV SOLN
Freq: Once | INTRAVENOUS | Status: AC
  Filled 2019-10-03: qty 1000

## 2019-10-03 MED ORDER — SODIUM CHLORIDE 0.9% FLUSH
10.0000 mL | Freq: Once | INTRAVENOUS | Status: AC | PRN
  Administered 2019-10-03: 10 mL

## 2019-10-03 NOTE — Patient Instructions (Signed)
[facility]  Discharge Instructions:   _______________________________________________________________  Thank you for choosing [facility] at [facility] to provide your oncology and hematology care.  To afford each patient quality time with our providers, please arrive at least 15 minutes before your scheduled appointment.  You need to re-schedule your appointment if you arrive 10 or more minutes late.  We strive to give you quality time with our providers, and arriving late affects you and other patients whose appointments are after yours.  Also, if you no show three or more times for appointments you may be dismissed from the clinic.  Again, thank you for choosing [facility]. Our hope is that these requests will allow you access to exceptional care and in a timely manner. _______________________________________________________________  If you have questions after your visit, please contact our office at [phone] between the hours of 8:30 a.m. and 5:00 p.m. Voicemails left after 4:30 p.m. will not be returned until the following business day. _______________________________________________________________  For prescription refill requests, have your pharmacy contact our office. _______________________________________________________________  Recommendations made by the consultant and any test results will be sent to your referring physician. _______________________________________________________________ 

## 2019-10-03 NOTE — Progress Notes (Signed)
Patient presents today for IV fluids. Vital signs stable. Patient has ongoing pain located on the Left of her throat.  See MAR. Lidocaine 2% solution 36mls every 4 hours as needed. Magic mouth wash 5 mls TID for pain as needed. Patient states it helps per patient's words.   Hydration given today per MD orders. Tolerated infusion without adverse affects. Vital signs stable. No complaints at this time. Discharged from clinic ambulatory. F/U with [facility] as scheduled.

## 2019-10-04 ENCOUNTER — Other Ambulatory Visit: Payer: Self-pay

## 2019-10-04 ENCOUNTER — Inpatient Hospital Stay (HOSPITAL_COMMUNITY): Payer: PPO

## 2019-10-04 VITALS — BP 135/54 | HR 74 | Temp 97.3°F | Resp 18

## 2019-10-04 DIAGNOSIS — C090 Malignant neoplasm of tonsillar fossa: Secondary | ICD-10-CM | POA: Diagnosis not present

## 2019-10-04 DIAGNOSIS — Z5111 Encounter for antineoplastic chemotherapy: Secondary | ICD-10-CM | POA: Diagnosis not present

## 2019-10-04 DIAGNOSIS — C099 Malignant neoplasm of tonsil, unspecified: Secondary | ICD-10-CM

## 2019-10-04 DIAGNOSIS — E860 Dehydration: Secondary | ICD-10-CM

## 2019-10-04 MED ORDER — SODIUM CHLORIDE 0.9% FLUSH
10.0000 mL | Freq: Once | INTRAVENOUS | Status: AC | PRN
  Administered 2019-10-04: 10 mL

## 2019-10-04 MED ORDER — SODIUM CHLORIDE 0.9 % IV SOLN
Freq: Once | INTRAVENOUS | Status: AC
  Filled 2019-10-04: qty 1000

## 2019-10-04 MED ORDER — HEPARIN SOD (PORK) LOCK FLUSH 100 UNIT/ML IV SOLN
500.0000 [IU] | Freq: Once | INTRAVENOUS | Status: AC | PRN
  Administered 2019-10-04: 500 [IU]

## 2019-10-04 NOTE — Progress Notes (Signed)
Patient came in for IVF's today. Patient is requesting liquid pain medication today. Will notify MD per her request.    Patient tolerated it well without problems. Vitals stable and discharged home from clinic ambulatory. Follow up as scheduled.

## 2019-10-05 ENCOUNTER — Inpatient Hospital Stay (HOSPITAL_COMMUNITY): Payer: PPO

## 2019-10-05 VITALS — BP 127/76 | HR 72 | Temp 96.9°F | Resp 17

## 2019-10-05 DIAGNOSIS — C099 Malignant neoplasm of tonsil, unspecified: Secondary | ICD-10-CM

## 2019-10-05 DIAGNOSIS — Z5111 Encounter for antineoplastic chemotherapy: Secondary | ICD-10-CM | POA: Diagnosis not present

## 2019-10-05 DIAGNOSIS — E860 Dehydration: Secondary | ICD-10-CM

## 2019-10-05 DIAGNOSIS — C090 Malignant neoplasm of tonsillar fossa: Secondary | ICD-10-CM | POA: Diagnosis not present

## 2019-10-05 MED ORDER — SODIUM CHLORIDE 0.9 % IV SOLN
Freq: Once | INTRAVENOUS | Status: AC
  Filled 2019-10-05: qty 1000

## 2019-10-05 MED ORDER — HEPARIN SOD (PORK) LOCK FLUSH 100 UNIT/ML IV SOLN
500.0000 [IU] | Freq: Once | INTRAVENOUS | Status: AC | PRN
  Administered 2019-10-05: 500 [IU]

## 2019-10-05 NOTE — Progress Notes (Signed)
Tolerated infusion w/o adverse reaction.  Alert, in no distress.  VSS.  Discharged ambulatory.  

## 2019-10-06 ENCOUNTER — Other Ambulatory Visit: Payer: Self-pay

## 2019-10-06 ENCOUNTER — Encounter (HOSPITAL_COMMUNITY): Payer: Self-pay

## 2019-10-06 ENCOUNTER — Inpatient Hospital Stay (HOSPITAL_COMMUNITY): Payer: PPO

## 2019-10-06 VITALS — BP 134/57 | HR 77 | Temp 97.2°F | Resp 18

## 2019-10-06 DIAGNOSIS — E860 Dehydration: Secondary | ICD-10-CM

## 2019-10-06 DIAGNOSIS — C099 Malignant neoplasm of tonsil, unspecified: Secondary | ICD-10-CM

## 2019-10-06 DIAGNOSIS — Z5111 Encounter for antineoplastic chemotherapy: Secondary | ICD-10-CM | POA: Diagnosis not present

## 2019-10-06 MED ORDER — SODIUM CHLORIDE 0.9% FLUSH: 10.0000 mL | Freq: Once | INTRAVENOUS | Status: DC | PRN

## 2019-10-06 MED ORDER — HEPARIN SOD (PORK) LOCK FLUSH 100 UNIT/ML IV SOLN
500.0000 [IU] | Freq: Once | INTRAVENOUS | Status: AC | PRN
  Administered 2019-10-06: 500 [IU]

## 2019-10-06 MED ORDER — SODIUM CHLORIDE 0.9 % IV SOLN
Freq: Once | INTRAVENOUS | Status: AC
  Filled 2019-10-06: qty 1000

## 2019-10-06 NOTE — Patient Instructions (Signed)
[facility]  Discharge Instructions:   _______________________________________________________________  Thank you for choosing [facility] to provide your oncology and hematology care.  To afford each patient quality time with our providers, please arrive at least 15 minutes before your scheduled appointment.  You need to re-schedule your appointment if you arrive 10 or more minutes late.  We strive to give you quality time with our providers, and arriving late affects you and other patients whose appointments are after yours.  Also, if you no show three or more times for appointments you may be dismissed from the clinic.  Again, thank you for choosing [facility] at [facility]. Our hope is that these requests will allow you access to exceptional care and in a timely manner. _______________________________________________________________  If you have questions after your visit, please contact our office at [phone] between the hours of 8:30 a.m. and 5:00 p.m. Voicemails left after 4:30 p.m. will not be returned until the following business day. _______________________________________________________________  For prescription refill requests, have your pharmacy contact our office. _______________________________________________________________  Recommendations made by the consultant and any test results will be sent to your referring physician. _______________________________________________________________ 

## 2019-10-06 NOTE — Progress Notes (Signed)
Patient presents today for hydration fluids. Vital signs stable. Patient denies any significant changes since the last visit. Patient has pain on the left side of her throat.   Hydration given today per MD orders. Tolerated infusion without adverse affects. Vital signs stable. No complaints at this time. Discharged from clinic ambulatory. F/U with [facility] as scheduled.

## 2019-10-09 ENCOUNTER — Encounter (HOSPITAL_COMMUNITY): Payer: Self-pay

## 2019-10-09 ENCOUNTER — Other Ambulatory Visit: Payer: Self-pay

## 2019-10-09 ENCOUNTER — Inpatient Hospital Stay (HOSPITAL_COMMUNITY): Payer: PPO

## 2019-10-09 VITALS — BP 144/68 | HR 79 | Temp 97.0°F | Resp 18

## 2019-10-09 DIAGNOSIS — Z5111 Encounter for antineoplastic chemotherapy: Secondary | ICD-10-CM | POA: Diagnosis not present

## 2019-10-09 DIAGNOSIS — E860 Dehydration: Secondary | ICD-10-CM

## 2019-10-09 DIAGNOSIS — C099 Malignant neoplasm of tonsil, unspecified: Secondary | ICD-10-CM

## 2019-10-09 MED ORDER — HEPARIN SOD (PORK) LOCK FLUSH 100 UNIT/ML IV SOLN
500.0000 [IU] | Freq: Once | INTRAVENOUS | Status: AC | PRN
  Administered 2019-10-09: 500 [IU]

## 2019-10-09 MED ORDER — SODIUM CHLORIDE 0.9% FLUSH
10.0000 mL | Freq: Once | INTRAVENOUS | Status: AC | PRN
  Administered 2019-10-09: 10 mL

## 2019-10-09 MED ORDER — SODIUM CHLORIDE 0.9 % IV SOLN
Freq: Once | INTRAVENOUS | Status: AC
  Filled 2019-10-09: qty 1000

## 2019-10-09 NOTE — Patient Instructions (Signed)
[facility] °Discharge Instructions ° °Received IV hydration with magnesium and potassium today. Follow-up as scheduled ° ° °Thank you for choosing [facility] at [facility] to provide your oncology and hematology care.  To afford each patient quality time with our provider, please arrive at least 15 minutes before your scheduled appointment time.  ° °If you have a lab appointment with the Cancer Center please come in thru the Main Entrance and check in at the main information desk. ° °You need to re-schedule your appointment should you arrive 10 or more minutes late.  We strive to give you quality time with our providers, and arriving late affects you and other patients whose appointments are after yours.  Also, if you no show three or more times for appointments you may be dismissed from the clinic at the providers discretion.     °Again, thank you for choosing [facility].  Our hope is that these requests will decrease the amount of time that you wait before being seen by our physicians.       °_____________________________________________________________ ° °Should you have questions after your visit to  Cancer Center, please contact our office at [phone] and follow the prompts.  Our office hours are 8:00 a.m. and 4:30 p.m. Monday - Friday.  Please note that voicemails left after 4:00 p.m. may not be returned until the following business day.  We are closed weekends and major holidays.  You do have access to a nurse 24-7, just call the main number to the clinic [phone] and do not press any options, hold on the line and a nurse will answer the phone.   ° °For prescription refill requests, have your pharmacy contact our office and allow 72 hours.   ° °Due to Covid, you will need to wear a mask upon entering the hospital. If you do not have a mask, a mask will be given to you at the Main Entrance upon arrival. For doctor  visits, patients may have 1 support person age 18 or older with them. For treatment visits, patients can not have anyone with them due to social distancing guidelines and our immunocompromised population.  ° ° ° °

## 2019-10-09 NOTE — Progress Notes (Signed)
Kristi Weiss tolerated IV hydration with magnesium and potassium well without complaints or incident. VSS upon discharge. Pt discharged self ambulatory in satisfactory condition

## 2019-10-11 ENCOUNTER — Inpatient Hospital Stay (HOSPITAL_COMMUNITY): Payer: PPO

## 2019-10-11 ENCOUNTER — Other Ambulatory Visit: Payer: Self-pay

## 2019-10-11 ENCOUNTER — Inpatient Hospital Stay (HOSPITAL_COMMUNITY): Payer: PPO | Admitting: Hematology

## 2019-10-11 VITALS — BP 154/63 | HR 77 | Resp 18

## 2019-10-11 VITALS — BP 141/65 | HR 77 | Temp 97.3°F | Resp 18 | Wt 194.2 lb

## 2019-10-11 DIAGNOSIS — C099 Malignant neoplasm of tonsil, unspecified: Secondary | ICD-10-CM

## 2019-10-11 DIAGNOSIS — Z5111 Encounter for antineoplastic chemotherapy: Secondary | ICD-10-CM | POA: Diagnosis not present

## 2019-10-11 DIAGNOSIS — E860 Dehydration: Secondary | ICD-10-CM

## 2019-10-11 LAB — COMPREHENSIVE METABOLIC PANEL
ALT: 12 U/L (ref 0–44)
AST: 14 U/L — ABNORMAL LOW (ref 15–41)
Albumin: 3.6 g/dL (ref 3.5–5.0)
Alkaline Phosphatase: 80 U/L (ref 38–126)
Anion gap: 11 (ref 5–15)
BUN: 13 mg/dL (ref 8–23)
CO2: 30 mmol/L (ref 22–32)
Calcium: 8.9 mg/dL (ref 8.9–10.3)
Chloride: 95 mmol/L — ABNORMAL LOW (ref 98–111)
Creatinine, Ser: 0.95 mg/dL (ref 0.44–1.00)
GFR calc Af Amer: >60 mL/min (ref >60)
GFR calc non Af Amer: 60 mL/min — ABNORMAL LOW (ref >60)
Glucose, Bld: 108 mg/dL — ABNORMAL HIGH (ref 70–99)
Potassium: 3.9 mmol/L (ref 3.5–5.1)
Sodium: 136 mmol/L (ref 135–145)
Total Bilirubin: 0.5 mg/dL (ref 0.3–1.2)
Total Protein: 6.4 g/dL — ABNORMAL LOW (ref 6.5–8.1)

## 2019-10-11 LAB — CBC WITH DIFFERENTIAL/PLATELET
Abs Immature Granulocytes: 0.03 10*3/uL (ref 0.00–0.07)
Basophils Absolute: 0 10*3/uL (ref 0.0–0.1)
Basophils Relative: 0 %
Eosinophils Absolute: 0 10*3/uL (ref 0.0–0.5)
Eosinophils Relative: 1 %
HCT: 28.3 % — ABNORMAL LOW (ref 36.0–46.0)
Hemoglobin: 9.2 g/dL — ABNORMAL LOW (ref 12.0–15.0)
Immature Granulocytes: 1 %
Lymphocytes Relative: 14 %
Lymphs Abs: 0.4 10*3/uL — ABNORMAL LOW (ref 0.7–4.0)
MCH: 30.3 pg (ref 26.0–34.0)
MCHC: 32.5 g/dL (ref 30.0–36.0)
MCV: 93.1 fL (ref 80.0–100.0)
Monocytes Absolute: 0.3 10*3/uL (ref 0.1–1.0)
Monocytes Relative: 12 %
Neutro Abs: 2.1 10*3/uL (ref 1.7–7.7)
Neutrophils Relative %: 72 %
Platelets: 300 10*3/uL (ref 150–400)
RBC: 3.04 MIL/uL — ABNORMAL LOW (ref 3.87–5.11)
RDW: 14.7 % (ref 11.5–15.5)
WBC: 2.9 10*3/uL — ABNORMAL LOW (ref 4.0–10.5)
nRBC: 0 % (ref 0.0–0.2)

## 2019-10-11 LAB — MAGNESIUM: Magnesium: 1.8 mg/dL (ref 1.7–2.4)

## 2019-10-11 MED ORDER — SODIUM CHLORIDE 0.9% FLUSH
10.0000 mL | Freq: Once | INTRAVENOUS | Status: AC | PRN
  Administered 2019-10-11: 10 mL

## 2019-10-11 MED ORDER — MORPHINE SULFATE (CONCENTRATE) 20 MG/ML PO SOLN: 10.0000 mg | ORAL | 0 refills | Status: DC | PRN

## 2019-10-11 MED ORDER — SUCRALFATE 1 GM/10ML PO SUSP
1.0000 g | Freq: Three times a day (TID) | ORAL | 1 refills | Status: DC
Start: 2019-10-11 — End: 2019-11-22

## 2019-10-11 MED ORDER — SODIUM CHLORIDE 0.9 % IV SOLN
Freq: Once | INTRAVENOUS | Status: AC
  Filled 2019-10-11: qty 1000

## 2019-10-11 MED ORDER — ONDANSETRON HCL 4 MG/2ML IJ SOLN
8.0000 mg | Freq: Once | INTRAMUSCULAR | Status: AC
  Administered 2019-10-11: 8 mg via INTRAVENOUS
  Filled 2019-10-11: qty 4

## 2019-10-11 MED ORDER — HEPARIN SOD (PORK) LOCK FLUSH 100 UNIT/ML IV SOLN
500.0000 [IU] | Freq: Once | INTRAVENOUS | Status: AC | PRN
  Administered 2019-10-11: 500 [IU]

## 2019-10-11 NOTE — Progress Notes (Signed)
Patient has been assessed, vital signs and labs have been reviewed by Dr. Delton Coombes. Please give 1 liter fluids with electrolytes over 2 hours along with 8 mg Zofran IV today per Dr.Katragadda.

## 2019-10-11 NOTE — Patient Instructions (Signed)
[place] at [facility] Discharge Instructions  You were seen today by Dr. Delton Coombes. He went over your recent results. You will be prescribed a morphine solution to gargle in your mouth for a couple minutes; you will also be prescribed carafate to coat the raw areas of your mouth; alternate carafate with the morphine. Dr. Delton Coombes will see you back in 2 weeks for labs and follow up.   Thank you for choosing [place] at [facility] to provide your oncology and hematology care.  To afford each patient quality time with our provider, please arrive at least 15 minutes before your scheduled appointment time.   If you have a lab appointment with the [facility] please come in thru the Main Entrance and check in at the main information desk  You need to re-schedule your appointment should you arrive 10 or more minutes late.  We strive to give you quality time with our providers, and arriving late affects you and other patients whose appointments are after yours.  Also, if you no show three or more times for appointments you may be dismissed from the clinic at the providers discretion.     Again, thank you for choosing [facility].  Our hope is that these requests will decrease the amount of time that you wait before being seen by our physicians.       _____________________________________________________________  Should you have questions after your visit to [facility], please contact our office at [phone] between the hours of 8:00 a.m. and 4:30 p.m.  Voicemails left after 4:00 p.m. will not be returned until the following business day.  For prescription refill requests, have your pharmacy contact our office and allow 72 hours.    Cancer Center Support Programs:   > Cancer Support Group  2nd Tuesday of the month 1pm-2pm, Journey Room

## 2019-10-11 NOTE — Progress Notes (Signed)
Patient tolerated hydration with no complaints voiced.  Port site clean and dry with good blood return noted before and after hydration.  No bruising or swelling noted with port.  Band aid applied.  VSS with discharge and left ambulatory with no s/s of distress noted.   

## 2019-10-11 NOTE — Progress Notes (Signed)
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[place])   07/26/2019 Cancer Staging   Staging form: Pharynx - HPV-Mediated Oropharynx, AJCC 8th Edition - Clinical stage from 07/26/2019: Stage I (cT2, cN1, cM0, p16+) - Signed by Derek Jack, MD on 07/26/2019   08/16/2019 -  Chemotherapy   The patient had palonosetron (ALOXI) injection 0.25 mg, 0.25 mg, Intravenous,  Once, 6 of 7 cycles Administration: 0.25 mg (08/16/2019), 0.25 mg (08/23/2019), 0.25 mg (08/30/2019), 0.25 mg (09/06/2019), 0.25 mg (09/13/2019), 0.25 mg (09/20/2019) CISplatin (PLATINOL) 84 mg in sodium chloride 0.9 % 250 mL chemo infusion, 40 mg/m2 = 84 mg, Intravenous,  Once, 6 of 7 cycles Administration: 84 mg (08/16/2019), 84 mg (08/23/2019), 84 mg (08/30/2019), 84 mg (09/06/2019), 84 mg (09/13/2019), 84 mg (09/20/2019) fosaprepitant (EMEND) 150 mg in sodium chloride 0.9 % 145 mL IVPB, 150 mg, Intravenous,  Once, 6 of 7 cycles Administration: 150 mg (08/16/2019), 150 mg (08/23/2019), 150 mg (08/30/2019), 150 mg (09/06/2019), 150 mg (09/13/2019), 150 mg (09/20/2019)  for chemotherapy treatment.      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 NEENA BEECHAM, a 74 y.o. female, returns for routine  follow-up of her left tonsillar cancer. Crissa was last seen on 10/02/2019.  Today she complains of sore throat and sores in her mouth which interferes with her swallowing; the pain sometimes wakes her up at night. Everything she eats and drinks burns her throat; she is able to swallow her saliva and she is drinking 3 cans of high-calorie Ensure daily along with soups. She takes Zofran for her nausea; she took one tablet of crushed Norco yesterday which made her nauseous most of the day. She denies numbness or tingling.  Her last chemo was on 7/21 and last radiation was on Thursday, 8/5.   REVIEW OF SYSTEMS:  Review of Systems  Constitutional: Positive for appetite change (depleted) and fatigue (depleted).  HENT:   Positive for mouth sores, sore throat and trouble swallowing.   Gastrointestinal: Positive for constipation, nausea and vomiting.  Neurological: Positive for headaches. Negative for numbness.  Psychiatric/Behavioral: Positive for depression and sleep disturbance.  All other systems reviewed and are negative.   PAST MEDICAL/SURGICAL HISTORY:  Past Medical History:  Diagnosis Date  . Anxiety about health 08/23/2019  . Arthritis   . GERD (gastroesophageal reflux disease)   . Hypothyroidism   . Hypothyroidism (acquired) 08/23/2019  . 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 Port-A-Cath in place 08/09/2019  . 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 Scoliosis   . Sinusitis   . 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  Past Surgical History:  Procedure Laterality Date  . NO PAST SURGERIES    . No prior surgery    . PORTACATH PLACEMENT Right 08/01/2019   Procedure: INSERTION PORT-A-CATH;  Surgeon: Aviva Signs, MD;  Location: [facility];  Service: General;  Laterality: Right;    SOCIAL HISTORY:  Social  History   Socioeconomic History  . Marital status: Widowed    Spouse name: Not on file  . Number of children: 1  . Years of education: Not on file  . Highest education level: Not on file  Occupational History  . Occupation: RETIRED  Tobacco Use  . Smoking status: Former Research scientist (life sciences)  . Smokeless tobacco: Never Used  . Tobacco comment: some in her 78s and 30s  Vaping Use  . Vaping Use: Never used  Substance and Sexual Activity  . Alcohol use: Yes    Comment: on rare occasion "like twice a year"  . Drug use: Never  . Sexual activity: Not Currently  Other Topics Concern  . Not on file  Social History Narrative   Lives at home alone   Retired   Widow   Caffeine: about 4 cups coffee, 3 glasses of tea daily   Social Determinants of Health   Financial Resource Strain: Low Risk   . Difficulty of Paying Living Expenses: Not hard at all  Food Insecurity: No Food Insecurity  . Worried About Charity fundraiser in the Last Year: Never true  . Ran Out of Food in the Last Year: Never true  Transportation Needs: No Transportation Needs  . Lack of Transportation (Medical): No  . Lack of Transportation (Non-Medical): No  Physical Activity: Sufficiently Active  . Days of Exercise per Week: 5 days  . Minutes of Exercise per Session: 30 min  Stress: Stress Concern Present  . Feeling of Stress : Very much  Social Connections:   . Frequency of Communication with Friends and Family:   . Frequency of Social Gatherings with Friends and Family:   . Attends Religious Services:   . Active Member of Clubs or Organizations:   . Attends Archivist Meetings:   Marland Kitchen Marital Status:   Intimate Partner Violence: Not At Risk  . Fear of Current or Ex-Partner: No  . Emotionally Abused: No  . Physically Abused: No  . Sexually Abused: No    FAMILY HISTORY:  Family History  Problem Relation Age of Onset  . Lung cancer Mother        smoker  . Cancer Mother        Throat  . Lung cancer Father         smoker  . Diabetes Father   . CVA Brother   . Diabetes Brother   . Diabetes Maternal Grandmother   . Heart attack Paternal Grandfather   . Migraines Neg Hx   . Headache Neg Hx     CURRENT MEDICATIONS:  Current Outpatient Medications  Medication Sig Dispense Refill  . butalbital-acetaminophen-caffeine (FIORICET) 50-325-40 MG tablet Take 1-2 tablets by mouth every 6 (six) hours as needed for headache (severe). 10 tablet 0  . diphenhydrAMINE (BENADRYL) 25 mg capsule Take 25 mg by mouth at bedtime as needed.     Marland Kitchen esomeprazole (NEXIUM) 40 MG capsule Take 40 mg by mouth daily.     Marland Kitchen ibuprofen (ADVIL) 200 MG tablet Take 400-600 mg by mouth every 8 (eight) hours as needed for moderate pain.     Marland Kitchen levothyroxine (SYNTHROID) 50 MCG tablet Take 50  mcg by mouth daily. BRAND    . lidocaine (XYLOCAINE) 2 % solution 10 mLs every 4 (four) hours as needed.     . lidocaine-prilocaine (EMLA) cream Apply a small amount to port a cath site and cover with plastic wrap 1 hour prior to chemotherapy appointments 30 g 3  . magic mouthwash SOLN Take 5 mLs by mouth 3 (three) times daily as needed for mouth pain. 15 mL 6  . nystatin (MYCOSTATIN) 100000 UNIT/ML suspension     . ondansetron (ZOFRAN ODT) 8 MG disintegrating tablet Take 1 tablet (8 mg total) by mouth every 8 (eight) hours as needed for nausea or vomiting. 45 tablet 2  . PREVIDENT 5000 BOOSTER PLUS 1.1 % PSTE APPLY WITH TOOTHBRUSH AT BEDTIME    . prochlorperazine (COMPAZINE) 10 MG tablet Take 1 tablet (10 mg total) by mouth every 6 (six) hours as needed (Nausea or vomiting). 30 tablet 1  . scopolamine (TRANSDERM-SCOP) 1 MG/3DAYS Place 1 patch (1.5 mg total) onto the skin every 3 (three) days. 10 patch 12   No current facility-administered medications for this visit.    ALLERGIES:  Allergies  Allergen Reactions  . Sulfamethoxazole-Trimethoprim Other (See Comments)    Headaches  . Penicillins Rash    20 years    PHYSICAL EXAM:    Performance status (ECOG): 1 - Symptomatic but completely ambulatory  Vitals:   10/11/19 0918  BP: (!) 141/65  Pulse: 77  Resp: 18  Temp: (!) 97.3 F (36.3 C)  SpO2: 96%   Wt Readings from Last 3 Encounters:  10/11/19 194 lb 3.2 oz (88.1 kg)  10/02/19 194 lb 11.2 oz (88.3 kg)  09/25/19 195 lb (88.5 kg)   Physical Exam Vitals reviewed.  Constitutional:      Appearance: Normal appearance. She is obese.  HENT:     Mouth/Throat:     Comments: Left buccal side raw Cardiovascular:     Rate and Rhythm: Normal rate and regular rhythm.     Pulses: Normal pulses.     Heart sounds: Normal heart sounds.  Pulmonary:     Effort: Pulmonary effort is normal.     Breath sounds: Normal breath sounds.  Chest:     Comments: Port-a-Cath on R chest Neurological:     General: No focal deficit present.     Mental Status: She is alert and oriented to person, place, and time.  Psychiatric:        Mood and Affect: Mood normal.        Behavior: Behavior normal.      LABORATORY DATA:  I have reviewed the labs as listed.  CBC Latest Ref Rng & Units 10/11/2019 10/02/2019 09/25/2019  WBC 4.0 - 10.5 K/uL 2.9(L) 1.6(L) 3.0(L)  Hemoglobin 12.0 - 15.0 g/dL 9.2(L) 9.5(L) 10.4(L)  Hematocrit 36 - 46 % 28.3(L) 28.4(L) 31.0(L)  Platelets 150 - 400 K/uL 300 164 128(L)   CMP Latest Ref Rng & Units 10/11/2019 10/02/2019 09/25/2019  Glucose 70 - 99 mg/dL 108(H) 112(H) 112(H)  BUN 8 - 23 mg/dL 13 20 16   Creatinine 0.44 - 1.00 mg/dL 0.95 1.22(H) 1.03(H)  Sodium 135 - 145 mmol/L 136 136 136  Potassium 3.5 - 5.1 mmol/L 3.9 3.7 3.4(L)  Chloride 98 - 111 mmol/L 95(L) 96(L) 95(L)  CO2 22 - 32 mmol/L 30 29 28   Calcium 8.9 - 10.3 mg/dL 8.9 8.8(L) 8.2(L)  Total Protein 6.5 - 8.1 g/dL 6.4(L) 6.2(L) 6.4(L)  Total Bilirubin 0.3 - 1.2 mg/dL 0.5 0.7 0.8  Alkaline Phos 38 - 126 U/L 80 82 80  AST 15 - 41 U/L 14(L) 15 21  ALT 0 - 44 U/L 12 16 22     DIAGNOSTIC IMAGING:  I have independently reviewed the scans and  discussed with the patient. No results found.   ASSESSMENT:  1.Squamous cell carcinoma, p16 positiveof the left tonsil: - 06/29/2019 Biopsy A. LYMPH NODE, LEFT NECK, NEEDLE CORE BIOPSY:  - Squamous cell carcinoma, p16 positive.  COMMENT:  The carcinoma is positive with p16, p40, cytokeratin 5/6 and p63 consistent with squamous cell carcinoma. The carcinoma is negative with Epstein-Barr virus (EBV), TTF-1 and Napsin-A. -PET scan on 07/18/2019 showed left level 2A lymph node, 1 cm. Small rounded lymph node 8 mm at level 2B on the left side. Intense palatine tonsillar uptake with asymmetric fullness of tonsillar tissue and symmetrical FDG activity with masslike changes on the left side measuring 2.2 x 1.7 cm compared to the right. SUV 16.2. Small focus of increased activity in the right neck adjacent to or within the small nodule in the right hemithyroid. -An ultrasound of the thyroid gland showed heterogeneous thyroid with no masses. -She was evaluated by Dr.Yanagihara. -XRT with weekly cisplatin started on 08/16/2019.  Week 6 of cisplatin on 09/20/2019. -XRT completed on 10/05/2019.   PLAN:  1. 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 -She continues to have problems with swallowing. -We will give her IV fluids with electrolytes today.  We will also give her Zofran today. -We will also arrange her for fluids on Friday with antiemetics.  I will see her back in 2 weeks for follow-up.  I plan to arrange for scans 4 to 6 weeks after completion of radiation.  2. Hypothyroidism: -Continue Synthroid daily.  Some days she is not able to swallow it.  3.Throat pain: -She is taking liquid ibuprofen which is not helping. -We will call her in Carafate liquid to coat ulcers in the mouth.  We will also prescribe liquid morphine to be mixed with 1 ounce of water, swish and spit every 3-4 hours.  4. Weight loss: -Her weight has  been stable in the last 10 days.  She lost about 25 pounds since start of therapy. -She is taking in 3 cans of boost with 350 cal.  5. Nausea/vomiting: -Continue Zofran ODT and Compazine as needed.  Today it is not controlled.  Will give IV Zofran.  6. Hypomagnesemia: -Magnesium today is 1.5.  We will give IV magnesium.   Orders placed this encounter:  No orders of the defined types were placed in this encounter.    Derek Jack, MD [place] [phone]   I, Milinda Antis, am acting as a scribe for Dr. Sanda Linger.  I, Derek Jack MD, have reviewed the above documentation for accuracy and completeness, and I agree with the above.

## 2019-10-13 ENCOUNTER — Other Ambulatory Visit: Payer: Self-pay

## 2019-10-13 ENCOUNTER — Inpatient Hospital Stay (HOSPITAL_COMMUNITY): Payer: PPO

## 2019-10-13 VITALS — BP 131/57 | HR 95 | Temp 96.9°F | Resp 17

## 2019-10-13 DIAGNOSIS — C099 Malignant neoplasm of tonsil, unspecified: Secondary | ICD-10-CM

## 2019-10-13 DIAGNOSIS — Z5111 Encounter for antineoplastic chemotherapy: Secondary | ICD-10-CM | POA: Diagnosis not present

## 2019-10-13 DIAGNOSIS — E860 Dehydration: Secondary | ICD-10-CM

## 2019-10-13 MED ORDER — ONDANSETRON HCL 4 MG/2ML IJ SOLN
8.0000 mg | Freq: Once | INTRAMUSCULAR | Status: AC
  Administered 2019-10-13: 8 mg via INTRAVENOUS
  Filled 2019-10-13: qty 4

## 2019-10-13 MED ORDER — HEPARIN SOD (PORK) LOCK FLUSH 100 UNIT/ML IV SOLN
500.0000 [IU] | Freq: Once | INTRAVENOUS | Status: AC | PRN
  Administered 2019-10-13: 500 [IU]

## 2019-10-13 MED ORDER — SODIUM CHLORIDE 0.9% FLUSH
10.0000 mL | Freq: Once | INTRAVENOUS | Status: AC | PRN
  Administered 2019-10-13: 10 mL

## 2019-10-13 MED ORDER — SODIUM CHLORIDE 0.9 % IV SOLN
Freq: Once | INTRAVENOUS | Status: AC
  Filled 2019-10-13: qty 1000

## 2019-10-13 NOTE — Patient Instructions (Signed)
[facility]  Discharge Instructions:   _______________________________________________________________  Thank you for choosing [facility] at [facility] to provide your oncology and hematology care.  To afford each patient quality time with our providers, please arrive at least 15 minutes before your scheduled appointment.  You need to re-schedule your appointment if you arrive 10 or more minutes late.  We strive to give you quality time with our providers, and arriving late affects you and other patients whose appointments are after yours.  Also, if you no show three or more times for appointments you may be dismissed from the clinic.  Again, thank you for choosing [facility] at [facility]. Our hope is that these requests will allow you access to exceptional care and in a timely manner. _______________________________________________________________  If you have questions after your visit, please contact our office at [phone] between the hours of 8:30 a.m. and 5:00 p.m. Voicemails left after 4:30 p.m. will not be returned until the following business day. _______________________________________________________________  For prescription refill requests, have your pharmacy contact our office. _______________________________________________________________  Recommendations made by the consultant and any test results will be sent to your referring physician. _______________________________________________________________ 

## 2019-10-13 NOTE — Progress Notes (Signed)
Hydration fluids given per orders. Patient tolerated it well without problems. Vitals stable and discharged home from clinic ambulatory. Follow up as scheduled.  

## 2019-10-13 NOTE — Progress Notes (Signed)
Nutrition Follow-up:  Patient with left tonsillar cancer, p 16 positive.  Patient receiving concurrent chemotherapy and radiation therapy.  Last radiation treatment was on 8/5 and last chemotherapy was 7/21.    Met with patient during IV fluids.  Patient reports no appetite, no taste and sore throat and sores in mouth. Taking 3, either ensure enlive/plus or [employer] (330 calories).  Has not started carafate or liquid morphine.  Drinking some water. Has pedialtye but has not tried it yet.  Drinking boost soothe shakes (peach-mint) flavor.  Not able to tolerate many foods due to no taste.  Declines feeding tube.      Medications: reviewed  Labs: reviewed  Anthropometrics:   Weight 194 lb 3.2 oz on 8/11 decreased from 195 lb on 7/26  199 lb on 7/23 212 lb on 6/23 213 lb on 5/27   NUTRITION DIAGNOSIS: Predicted suboptimal energy intake continues   INTERVENTION:  Patient declines feeding tube Discussed foods and high smooth, creamy foods to try.   Encouraged sips/bites q 1 hour Patient has contact information    MONITORING, EVALUATION, GOAL: weight trends, intake   NEXT VISIT: Sept 10 phone f/u  Lorriann Hansmann B. Zenia Resides, [place], [place] Registered Dietitian [phone] (mobile)

## 2019-10-20 DIAGNOSIS — H9012 Conductive hearing loss, unilateral, left ear, with unrestricted hearing on the contralateral side: Secondary | ICD-10-CM | POA: Diagnosis not present

## 2019-10-20 DIAGNOSIS — J343 Hypertrophy of nasal turbinates: Secondary | ICD-10-CM | POA: Diagnosis not present

## 2019-10-20 DIAGNOSIS — H6982 Other specified disorders of Eustachian tube, left ear: Secondary | ICD-10-CM | POA: Diagnosis not present

## 2019-10-20 DIAGNOSIS — J310 Chronic rhinitis: Secondary | ICD-10-CM | POA: Diagnosis not present

## 2019-10-20 DIAGNOSIS — C142 Malignant neoplasm of Waldeyer's ring: Secondary | ICD-10-CM | POA: Diagnosis not present

## 2019-10-26 ENCOUNTER — Encounter (HOSPITAL_COMMUNITY): Payer: Self-pay

## 2019-10-26 ENCOUNTER — Inpatient Hospital Stay (HOSPITAL_COMMUNITY): Payer: PPO

## 2019-10-26 ENCOUNTER — Other Ambulatory Visit: Payer: Self-pay

## 2019-10-26 ENCOUNTER — Inpatient Hospital Stay (HOSPITAL_BASED_OUTPATIENT_CLINIC_OR_DEPARTMENT_OTHER): Payer: PPO | Admitting: Hematology

## 2019-10-26 VITALS — BP 132/62 | HR 92 | Temp 96.9°F | Resp 18 | Wt 189.4 lb

## 2019-10-26 DIAGNOSIS — C099 Malignant neoplasm of tonsil, unspecified: Secondary | ICD-10-CM

## 2019-10-26 DIAGNOSIS — Z5111 Encounter for antineoplastic chemotherapy: Secondary | ICD-10-CM | POA: Diagnosis not present

## 2019-10-26 DIAGNOSIS — E860 Dehydration: Secondary | ICD-10-CM | POA: Diagnosis not present

## 2019-10-26 LAB — COMPREHENSIVE METABOLIC PANEL
ALT: 18 U/L (ref 0–44)
AST: 18 U/L (ref 15–41)
Albumin: 3.9 g/dL (ref 3.5–5.0)
Alkaline Phosphatase: 80 U/L (ref 38–126)
Anion gap: 11 (ref 5–15)
BUN: 23 mg/dL (ref 8–23)
CO2: 28 mmol/L (ref 22–32)
Calcium: 9.1 mg/dL (ref 8.9–10.3)
Chloride: 96 mmol/L — ABNORMAL LOW (ref 98–111)
Creatinine, Ser: 1.22 mg/dL — ABNORMAL HIGH (ref 0.44–1.00)
GFR calc Af Amer: 51 mL/min — ABNORMAL LOW (ref >60)
GFR calc non Af Amer: 44 mL/min — ABNORMAL LOW (ref >60)
Glucose, Bld: 126 mg/dL — ABNORMAL HIGH (ref 70–99)
Potassium: 3.9 mmol/L (ref 3.5–5.1)
Sodium: 135 mmol/L (ref 135–145)
Total Bilirubin: 0.6 mg/dL (ref 0.3–1.2)
Total Protein: 7 g/dL (ref 6.5–8.1)

## 2019-10-26 LAB — CBC WITH DIFFERENTIAL/PLATELET
Abs Immature Granulocytes: 0.05 10*3/uL (ref 0.00–0.07)
Basophils Absolute: 0 10*3/uL (ref 0.0–0.1)
Basophils Relative: 0 %
Eosinophils Absolute: 0.1 10*3/uL (ref 0.0–0.5)
Eosinophils Relative: 2 %
HCT: 31.1 % — ABNORMAL LOW (ref 36.0–46.0)
Hemoglobin: 10.1 g/dL — ABNORMAL LOW (ref 12.0–15.0)
Immature Granulocytes: 1 %
Lymphocytes Relative: 14 %
Lymphs Abs: 1 10*3/uL (ref 0.7–4.0)
MCH: 30.9 pg (ref 26.0–34.0)
MCHC: 32.5 g/dL (ref 30.0–36.0)
MCV: 95.1 fL (ref 80.0–100.0)
Monocytes Absolute: 0.5 10*3/uL (ref 0.1–1.0)
Monocytes Relative: 6 %
Neutro Abs: 5.7 10*3/uL (ref 1.7–7.7)
Neutrophils Relative %: 77 %
Platelets: 324 10*3/uL (ref 150–400)
RBC: 3.27 MIL/uL — ABNORMAL LOW (ref 3.87–5.11)
RDW: 16.5 % — ABNORMAL HIGH (ref 11.5–15.5)
WBC: 7.4 10*3/uL (ref 4.0–10.5)
nRBC: 0 % (ref 0.0–0.2)

## 2019-10-26 LAB — MAGNESIUM: Magnesium: 2.1 mg/dL (ref 1.7–2.4)

## 2019-10-26 MED ORDER — HEPARIN SOD (PORK) LOCK FLUSH 100 UNIT/ML IV SOLN
500.0000 [IU] | Freq: Once | INTRAVENOUS | Status: AC
  Administered 2019-10-26: 500 [IU] via INTRAVENOUS

## 2019-10-26 MED ORDER — SODIUM CHLORIDE 0.9 % IV SOLN
Freq: Once | INTRAVENOUS | Status: AC
  Filled 2019-10-26: qty 1000

## 2019-10-26 MED ORDER — SODIUM CHLORIDE 0.9% FLUSH
10.0000 mL | Freq: Once | INTRAVENOUS | Status: AC
  Administered 2019-10-26: 10 mL

## 2019-10-26 NOTE — Progress Notes (Signed)
House fluids given today per MD orders. Tolerated infusion without adverse affects. Vital signs stable. No complaints at this time. Discharged from clinic ambulatory. F/U with [facility] as scheduled.

## 2019-10-26 NOTE — Progress Notes (Signed)
Kristi Weiss, Kristi Weiss [id number]   CLINIC:  Medical Oncology/Hematology  PCP:  [facility], [place] / [place] [address] [phone]   REASON FOR VISIT:  Follow-up for left tonsillar cancer  PRIOR THERAPY: Cisplatin & Aloxi x 6 cycles from 08/16/2019 to 09/20/2019.  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[place])   07/26/2019 Cancer Staging   Staging form: Pharynx - HPV-Mediated Oropharynx, AJCC 8th Edition - Clinical stage from 07/26/2019: Stage I (cT2, cN1, cM0, p16+) - Signed by Derek Jack, MD on 07/26/2019   08/16/2019 -  Chemotherapy   The patient had palonosetron (ALOXI) injection 0.25 mg, 0.25 mg, Intravenous,  Once, 6 of 7 cycles Administration: 0.25 mg (08/16/2019), 0.25 mg (08/23/2019), 0.25 mg (08/30/2019), 0.25 mg (09/06/2019), 0.25 mg (09/13/2019), 0.25 mg (09/20/2019) CISplatin (PLATINOL) 84 mg in sodium chloride 0.9 % 250 mL chemo infusion, 40 mg/m2 = 84 mg, Intravenous,  Once, 6 of 7 cycles Administration: 84 mg (08/16/2019), 84 mg (08/23/2019), 84 mg (08/30/2019), 84 mg (09/06/2019), 84 mg (09/13/2019), 84 mg (09/20/2019) fosaprepitant (EMEND) 150 mg in sodium chloride 0.9 % 145 mL IVPB, 150 mg, Intravenous,  Once, 6 of 7 cycles Administration: 150 mg (08/16/2019), 150 mg (08/23/2019), 150 mg (08/30/2019), 150 mg (09/06/2019), 150 mg (09/13/2019), 150 mg (09/20/2019)  for chemotherapy treatment.      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 female, returns for routine  follow-up of her left tonsillar cancer. Kristi Weiss was last seen on 10/11/2019.  Today she reports feeling well. She is drinking 3 cans of Ensure Plus along with Gatorade daily; her taste has improved very slightly. She continues having sore throat and trouble swallowing and reports that the morphine solution did not really help with the pain; she takes children's ibuprofen TID. Her pain has improved slightly over the past 2 weeks, through it still feels raw. She reports having a burning sensation in her left anterior throat which feels like a hot coal. Her nausea has improved and she is able to swallow her meds daily. She endorses not drinking enough water due to the pain in her left throat and still not tolerating pureed foods or soups.   REVIEW OF SYSTEMS:  Review of Systems  Constitutional: Positive for appetite change (depleted) and fatigue (severe).  HENT:   Positive for mouth sores, sore throat and trouble swallowing (L anterior throat pain).   Gastrointestinal: Negative for nausea.  Neurological: Positive for headaches.  Psychiatric/Behavioral: Positive for sleep disturbance. The patient is nervous/anxious.   All other systems reviewed and are negative.   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   Past Surgical History:  Procedure Laterality Date   NO PAST SURGERIES     No prior surgery     PORTACATH PLACEMENT Right 08/01/2019   Procedure:  INSERTION PORT-A-CATH;  Surgeon: Aviva Signs, MD;  Location: [facility];  Service: General;  Laterality: Right;    SOCIAL HISTORY:  Social History   Socioeconomic History   Marital status: Widowed    Spouse name: Not on file   Number of children: 1   Years of education: Not on file   Highest education level: Not on file  Occupational History   Occupation: RETIRED  Tobacco Use   Smoking status: Former Smoker   Smokeless tobacco: Never Used   Tobacco comment: some in her 41s and 16s  Vaping Use   Vaping Use: Never used  Substance and Sexual Activity   Alcohol use: Yes    Comment: on rare occasion "like twice a year"   Drug use: Never   Sexual activity: Not Currently  Other Topics Concern   Not on file  Social History Narrative   Lives at home alone   Retired   Widow   Caffeine: about 4 cups coffee, 3 glasses of tea daily   Social Determinants of Radio broadcast assistant Strain: Low Risk    Difficulty of Paying Living Expenses: Not hard at all  Food Insecurity: No Food Insecurity   Worried About Charity fundraiser in the Last Year: Never true   Arboriculturist in the Last Year: Never true  Transportation Needs: No Transportation Needs   Lack of Transportation (Medical): No   Lack of Transportation (Non-Medical): No  Physical Activity: Sufficiently Active   Days of Exercise per Week: 5 days   Minutes of Exercise per Session: 30 min  Stress: Stress Concern Present   Feeling of Stress : Very much  Social Connections:    Frequency of Communication with Friends and Family: Not on file   Frequency of Social Gatherings with Friends and Family: Not on file   Attends Religious Services: Not on file   Active Member of Clubs or Organizations: Not on file   Attends Archivist Meetings: Not on file   Marital Status: Not on file  Intimate Partner Violence: Not At Risk   Fear of Current or Ex-Partner: No   Emotionally Abused: No    Physically Abused: No   Sexually Abused: No    FAMILY HISTORY:  Family History  Problem Relation Age of Onset   Lung cancer Mother        smoker   Cancer Mother        Throat   Lung cancer Father        smoker   Diabetes Father    CVA Brother    Diabetes Brother    Diabetes Maternal Grandmother    Heart attack Paternal Grandfather    Migraines Neg Hx    Headache Neg Hx     CURRENT MEDICATIONS:  Current Outpatient Medications  Medication Sig Dispense Refill   esomeprazole (NEXIUM) 40 MG capsule Take 40 mg by mouth daily.      ibuprofen (ADVIL) 200 MG tablet Take 400-600 mg by mouth every 8 (eight) hours as needed for moderate pain.      levothyroxine (SYNTHROID) 50 MCG tablet Take 50 mcg by mouth daily. BRAND  lidocaine (XYLOCAINE) 2 % solution 10 mLs every 4 (four) hours as needed.      magic mouthwash SOLN Take 5 mLs by mouth 3 (three) times daily as needed for mouth pain. 15 mL 6   PREVIDENT 5000 BOOSTER PLUS 1.1 % PSTE APPLY WITH TOOTHBRUSH AT BEDTIME     scopolamine (TRANSDERM-SCOP) 1 MG/3DAYS Place 1 patch (1.5 mg total) onto the skin every 3 (three) days. 10 patch 12   butalbital-acetaminophen-caffeine (FIORICET) 50-325-40 MG tablet Take 1-2 tablets by mouth every 6 (six) hours as needed for headache (severe). (Patient not taking: Reported on 10/26/2019) 10 tablet 0   diphenhydrAMINE (BENADRYL) 25 mg capsule Take 25 mg by mouth at bedtime as needed.  (Patient not taking: Reported on 10/26/2019)     lidocaine-prilocaine (EMLA) cream Apply a small amount to port a cath site and cover with plastic wrap 1 hour prior to chemotherapy appointments (Patient not taking: Reported on 10/26/2019) 30 g 3   morphine (ROXANOL) 20 MG/ML concentrated solution Take 0.5 mLs (10 mg total) by mouth every 4 (four) hours as needed for severe pain. Mix 0.55ml in 19ml water and swish and spit for few minutes every 4 hours (Patient not taking: Reported on 10/26/2019) 30 mL 0    nystatin (MYCOSTATIN) 100000 UNIT/ML suspension  (Patient not taking: Reported on 10/26/2019)     ondansetron (ZOFRAN ODT) 8 MG disintegrating tablet Take 1 tablet (8 mg total) by mouth every 8 (eight) hours as needed for nausea or vomiting. (Patient not taking: Reported on 10/26/2019) 45 tablet 2   prochlorperazine (COMPAZINE) 10 MG tablet Take 1 tablet (10 mg total) by mouth every 6 (six) hours as needed (Nausea or vomiting). (Patient not taking: Reported on 10/26/2019) 30 tablet 1   sucralfate (CARAFATE) 1 GM/10ML suspension Take 10 mLs (1 g total) by mouth 4 (four) times daily -  with meals and at bedtime. (Patient not taking: Reported on 10/26/2019) 420 mL 1   No current facility-administered medications for this visit.    ALLERGIES:  Allergies  Allergen Reactions   Sulfamethoxazole-Trimethoprim Other (See Comments)    Headaches   Penicillins Rash    20 years    PHYSICAL EXAM:  Performance status (ECOG): 1 - Symptomatic but completely ambulatory  Vitals:   10/26/19 0957  BP: 132/62  Pulse: 92  Resp: 18  Temp: (!) 96.9 F (36.1 C)  SpO2: 97%   Wt Readings from Last 3 Encounters:  10/26/19 189 lb 6.4 oz (85.9 kg)  10/11/19 194 lb 3.2 oz (88.1 kg)  10/02/19 194 lb 11.2 oz (88.3 kg)   Physical Exam Vitals reviewed.  Constitutional:      Appearance: Normal appearance.  HENT:     Mouth/Throat:     Lips: No lesions.     Mouth: No oral lesions.     Tongue: No lesions.     Pharynx: Posterior oropharyngeal erythema present.     Tonsils: Tonsillar exudate (small white speck on L tonsil) present.  Cardiovascular:     Rate and Rhythm: Normal rate and regular rhythm.     Pulses: Normal pulses.     Heart sounds: Normal heart sounds.  Pulmonary:     Effort: Pulmonary effort is normal.     Breath sounds: Normal breath sounds.  Chest:     Comments: Port-a-Cath in R chest Lymphadenopathy:     Cervical: No cervical adenopathy.     Upper Body:     Right upper body: No  supraclavicular adenopathy.  Left upper body: No supraclavicular adenopathy.  Neurological:     General: No focal deficit present.     Mental Status: She is alert and oriented to person, place, and time.  Psychiatric:        Mood and Affect: Mood normal.        Behavior: Behavior normal.      LABORATORY DATA:  I have reviewed the labs as listed.  CBC Latest Ref Rng & Units 10/26/2019 10/11/2019 10/02/2019  WBC 4.0 - 10.5 K/uL 7.4 2.9(L) 1.6(L)  Hemoglobin 12.0 - 15.0 g/dL 10.1(L) 9.2(L) 9.5(L)  Hematocrit 36 - 46 % 31.1(L) 28.3(L) 28.4(L)  Platelets 150 - 400 K/uL 324 300 164   CMP Latest Ref Rng & Units 10/26/2019 10/11/2019 10/02/2019  Glucose 70 - 99 mg/dL 126(H) 108(H) 112(H)  BUN 8 - 23 mg/dL 23 13 20   Creatinine 0.44 - 1.00 mg/dL 1.22(H) 0.95 1.22(H)  Sodium 135 - 145 mmol/L 135 136 136  Potassium 3.5 - 5.1 mmol/L 3.9 3.9 3.7  Chloride 98 - 111 mmol/L 96(L) 95(L) 96(L)  CO2 22 - 32 mmol/L 28 30 29   Calcium 8.9 - 10.3 mg/dL 9.1 8.9 8.8(L)  Total Protein 6.5 - 8.1 g/dL 7.0 6.4(L) 6.2(L)  Total Bilirubin 0.3 - 1.2 mg/dL 0.6 0.5 0.7  Alkaline Phos 38 - 126 U/L 80 80 82  AST 15 - 41 U/L 18 14(L) 15  ALT 0 - 44 U/L 18 12 16     DIAGNOSTIC IMAGING:  I have independently reviewed the scans and discussed with the patient. No results found.   ASSESSMENT:  1.Squamous cell carcinoma, p16 positiveof the left tonsil: - 06/29/2019 Biopsy A. LYMPH NODE, LEFT NECK, NEEDLE CORE BIOPSY:  - Squamous cell carcinoma, p16 positive.  COMMENT:  The carcinoma is positive with p16, p40, cytokeratin 5/6 and p63 consistent with squamous cell carcinoma. The carcinoma is negative with Epstein-Barr virus (EBV), TTF-1 and Napsin-A. -PET scan on 07/18/2019 showed left level 2A lymph node, 1 cm. Small rounded lymph node 8 mm at level 2B on the left side. Intense palatine tonsillar uptake with asymmetric fullness of tonsillar tissue and symmetrical FDG activity with masslike changes on the left side  measuring 2.2 x 1.7 cm compared to the right. SUV 16.2. Small focus of increased activity in the right neck adjacent to or within the small nodule in the right hemithyroid. -An ultrasound of the thyroid gland showed heterogeneous thyroid with no masses. -She was evaluated by Dr.Yanagihara. -XRT with weekly cisplatin started on 08/16/2019.Week 6 of cisplatin on 09/20/2019. -XRT completed on 10/05/2019.   PLAN:  1. 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 -She continues to have problems swallowing.  She is drinking about 3 cans of boost plus. -She lost 5 pounds.  I have reviewed her labs.  Creatinine increased to 1.22.  She will receive IV fluids today.  LFTs are normal.  CBC also shows normalized white count and platelets. -I plan to see her back in 3 weeks for follow-up.  CT neck with contrast will be scheduled prior to next visit.  2. Hypothyroidism: -Continue Synthroid daily.  3.Throat pain: -She is taking liquid ibuprofen.  Carafate did not taste well.  She stopped using morphine liquid. -Throat exam today revealed resolution of left tonsillar mass.  4. Weight loss: -She lost a total of 25 pounds.  She lost 5 pounds since last visit. -She is drinking 3 cans of boost 350 cal.  I  have told her to increase it to 4 cans daily.  She is not able to eat any solid foods.  5. Nausea/vomiting: -She is no longer nauseous.  Use Zofran as needed.  6. Hypomagnesemia: -Magnesium today is normal.   Orders placed this encounter:  No orders of the defined types were placed in this encounter.    Derek Jack, MD [facility] [phone]   I, Milinda Antis, am acting as a scribe for Dr. Sanda Linger.  I, Derek Jack MD, have reviewed the above documentation for accuracy and completeness, and I agree with the above.

## 2019-10-26 NOTE — Patient Instructions (Signed)
[place] at [facility] Discharge Instructions  You were seen today by Dr. Delton Coombes. He went over your recent results. You received fluids today. Drink 4 cans of Ensure daily to maintain your weight. You will be scheduled for a CT scan of your neck before your next visit. Dr. Delton Coombes will see you back in 3 weeks for labs and follow up.   Thank you for choosing [place] at [facility] to provide your oncology and hematology care.  To afford each patient quality time with our provider, please arrive at least 15 minutes before your scheduled appointment time.   If you have a lab appointment with the [facility] please come in thru the Main Entrance and check in at the main information desk  You need to re-schedule your appointment should you arrive 10 or more minutes late.  We strive to give you quality time with our providers, and arriving late affects you and other patients whose appointments are after yours.  Also, if you no show three or more times for appointments you may be dismissed from the clinic at the providers discretion.     Again, thank you for choosing [facility].  Our hope is that these requests will decrease the amount of time that you wait before being seen by our physicians.       _____________________________________________________________  Should you have questions after your visit to [facility], please contact our office at [phone] between the hours of 8:00 a.m. and 4:30 p.m.  Voicemails left after 4:00 p.m. will not be returned until the following business day.  For prescription refill requests, have your pharmacy contact our office and allow 72 hours.    Cancer Center Support Programs:   > Cancer Support Group  2nd Tuesday of the month 1pm-2pm, Journey Room

## 2019-10-26 NOTE — Progress Notes (Signed)
Patient has been assessed by Dr. Delton Coombes today. She has not been drinking well orders received for fluids with electrolytes.  Primary RN and pharmacy aware.

## 2019-10-26 NOTE — Patient Instructions (Signed)
[facility] at [facility]  Discharge Instructions:   _______________________________________________________________  Thank you for choosing [facility] at [facility] to provide your oncology and hematology care.  To afford each patient quality time with our providers, please arrive at least 15 minutes before your scheduled appointment.  You need to re-schedule your appointment if you arrive 10 or more minutes late.  We strive to give you quality time with our providers, and arriving late affects you and other patients whose appointments are after yours.  Also, if you no show three or more times for appointments you may be dismissed from the clinic.  Again, thank you for choosing [facility] at [facility]. Our hope is that these requests will allow you access to exceptional care and in a timely manner. _______________________________________________________________  If you have questions after your visit, please contact our office at [phone] between the hours of 8:30 a.m. and 5:00 p.m. Voicemails left after 4:30 p.m. will not be returned until the following business day. _______________________________________________________________  For prescription refill requests, have your pharmacy contact our office. _______________________________________________________________  Recommendations made by the consultant and any test results will be sent to your referring physician. _______________________________________________________________ 

## 2019-11-09 DIAGNOSIS — C099 Malignant neoplasm of tonsil, unspecified: Secondary | ICD-10-CM | POA: Diagnosis not present

## 2019-11-10 ENCOUNTER — Inpatient Hospital Stay (HOSPITAL_COMMUNITY): Payer: PPO | Attending: Hematology

## 2019-11-10 DIAGNOSIS — Z79899 Other long term (current) drug therapy: Secondary | ICD-10-CM | POA: Insufficient documentation

## 2019-11-10 DIAGNOSIS — H6982 Other specified disorders of Eustachian tube, left ear: Secondary | ICD-10-CM | POA: Diagnosis not present

## 2019-11-10 DIAGNOSIS — H9012 Conductive hearing loss, unilateral, left ear, with unrestricted hearing on the contralateral side: Secondary | ICD-10-CM | POA: Diagnosis not present

## 2019-11-10 DIAGNOSIS — H6522 Chronic serous otitis media, left ear: Secondary | ICD-10-CM | POA: Diagnosis not present

## 2019-11-10 DIAGNOSIS — R634 Abnormal weight loss: Secondary | ICD-10-CM | POA: Insufficient documentation

## 2019-11-10 DIAGNOSIS — E039 Hypothyroidism, unspecified: Secondary | ICD-10-CM | POA: Insufficient documentation

## 2019-11-10 DIAGNOSIS — Z923 Personal history of irradiation: Secondary | ICD-10-CM | POA: Insufficient documentation

## 2019-11-10 DIAGNOSIS — C099 Malignant neoplasm of tonsil, unspecified: Secondary | ICD-10-CM | POA: Insufficient documentation

## 2019-11-10 DIAGNOSIS — Z5111 Encounter for antineoplastic chemotherapy: Secondary | ICD-10-CM | POA: Insufficient documentation

## 2019-11-10 NOTE — Progress Notes (Signed)
Nutrition Follow-up:  Patient with left tonsillar cancer, p 16 positive.  Patient completed radiation (8/5) and chemotherapy (7/21).    Spoke with patient via phone for nutrition follow-up.  Patient reports that she has been trying different foods and textures but really is not eating much.  Taste is still off.  Reports drinking about 3 ensure plus shakes daily, knows she needs to drink more but unsure what is keeping her from drinking more.  Has tried broth based soups, potato soup (mashed well), chopped noodles in broth.  Reports that she is seeing ENT this afternoon for a possible salivary stone causing pain.  Seen by radiation oncology MD yesterday. Texture of foods are difficult for patient.  Also has a fear of eating and trying different foods.  Tearful during our conversation.    Medications: reviewed  Labs: reviewed  Anthropometrics:   Weight 189 lb 6.4 oz on 8/26, 188 lb at radiation yesterday.   199 lb 7/23 212 lb on 6/23 213 lb on 5/27   NUTRITION DIAGNOSIS: Predicted suboptimal energy intake continues    INTERVENTION:  Patient has declined feeding tube in the past. Encouragement offered to continue trying foods.  Patient trying to rely more on calorie dense foods that she can tolerate. Discussed option of pureeing foods with smooth texture.   Encouraged experimenting with different tastes (likes brothy, salty tastes) Patient has contact information    MONITORING, EVALUATION, GOAL: weight trends, intake   NEXT VISIT: October 8 phone f/u  Anesa Fronek B. Zenia Resides, [place], [place] Registered Dietitian [phone] (mobile)

## 2019-11-16 ENCOUNTER — Ambulatory Visit (HOSPITAL_COMMUNITY): Payer: PPO | Admitting: Hematology

## 2019-11-16 ENCOUNTER — Other Ambulatory Visit (HOSPITAL_COMMUNITY): Payer: PPO

## 2019-11-20 ENCOUNTER — Ambulatory Visit (HOSPITAL_COMMUNITY)
Admission: RE | Admit: 2019-11-20 | Discharge: 2019-11-20 | Disposition: A | Discharge status: Home / Self Care | Payer: PPO | Source: Ambulatory Visit | Attending: Hematology | Admitting: Hematology

## 2019-11-20 ENCOUNTER — Other Ambulatory Visit: Payer: Self-pay

## 2019-11-20 ENCOUNTER — Inpatient Hospital Stay (HOSPITAL_COMMUNITY): Payer: PPO

## 2019-11-20 DIAGNOSIS — E041 Nontoxic single thyroid nodule: Secondary | ICD-10-CM | POA: Diagnosis not present

## 2019-11-20 DIAGNOSIS — R634 Abnormal weight loss: Secondary | ICD-10-CM | POA: Diagnosis not present

## 2019-11-20 DIAGNOSIS — C099 Malignant neoplasm of tonsil, unspecified: Secondary | ICD-10-CM | POA: Diagnosis not present

## 2019-11-20 DIAGNOSIS — Z923 Personal history of irradiation: Secondary | ICD-10-CM | POA: Diagnosis not present

## 2019-11-20 DIAGNOSIS — I7090 Unspecified atherosclerosis: Secondary | ICD-10-CM | POA: Diagnosis not present

## 2019-11-20 DIAGNOSIS — E039 Hypothyroidism, unspecified: Secondary | ICD-10-CM | POA: Diagnosis not present

## 2019-11-20 DIAGNOSIS — Z79899 Other long term (current) drug therapy: Secondary | ICD-10-CM | POA: Diagnosis not present

## 2019-11-20 DIAGNOSIS — C760 Malignant neoplasm of head, face and neck: Secondary | ICD-10-CM | POA: Diagnosis not present

## 2019-11-20 DIAGNOSIS — Z5111 Encounter for antineoplastic chemotherapy: Secondary | ICD-10-CM | POA: Diagnosis not present

## 2019-11-20 LAB — CBC WITH DIFFERENTIAL/PLATELET
Abs Immature Granulocytes: 0.03 10*3/uL (ref 0.00–0.07)
Basophils Absolute: 0 10*3/uL (ref 0.0–0.1)
Basophils Relative: 0 %
Eosinophils Absolute: 0.1 10*3/uL (ref 0.0–0.5)
Eosinophils Relative: 1 %
HCT: 31.3 % — ABNORMAL LOW (ref 36.0–46.0)
Hemoglobin: 10.1 g/dL — ABNORMAL LOW (ref 12.0–15.0)
Immature Granulocytes: 0 %
Lymphocytes Relative: 13 %
Lymphs Abs: 1.1 10*3/uL (ref 0.7–4.0)
MCH: 32 pg (ref 26.0–34.0)
MCHC: 32.3 g/dL (ref 30.0–36.0)
MCV: 99.1 fL (ref 80.0–100.0)
Monocytes Absolute: 0.5 10*3/uL (ref 0.1–1.0)
Monocytes Relative: 7 %
Neutro Abs: 6.1 10*3/uL (ref 1.7–7.7)
Neutrophils Relative %: 79 %
Platelets: 243 10*3/uL (ref 150–400)
RBC: 3.16 MIL/uL — ABNORMAL LOW (ref 3.87–5.11)
RDW: 15.3 % (ref 11.5–15.5)
WBC: 7.9 10*3/uL (ref 4.0–10.5)
nRBC: 0 % (ref 0.0–0.2)

## 2019-11-20 LAB — COMPREHENSIVE METABOLIC PANEL
ALT: 15 U/L (ref 0–44)
AST: 18 U/L (ref 15–41)
Albumin: 3.9 g/dL (ref 3.5–5.0)
Alkaline Phosphatase: 73 U/L (ref 38–126)
Anion gap: 10 (ref 5–15)
BUN: 22 mg/dL (ref 8–23)
CO2: 28 mmol/L (ref 22–32)
Calcium: 9.2 mg/dL (ref 8.9–10.3)
Chloride: 99 mmol/L (ref 98–111)
Creatinine, Ser: 1.21 mg/dL — ABNORMAL HIGH (ref 0.44–1.00)
GFR calc Af Amer: 52 mL/min — ABNORMAL LOW (ref >60)
GFR calc non Af Amer: 45 mL/min — ABNORMAL LOW (ref >60)
Glucose, Bld: 114 mg/dL — ABNORMAL HIGH (ref 70–99)
Potassium: 4 mmol/L (ref 3.5–5.1)
Sodium: 137 mmol/L (ref 135–145)
Total Bilirubin: 0.7 mg/dL (ref 0.3–1.2)
Total Protein: 7.2 g/dL (ref 6.5–8.1)

## 2019-11-20 LAB — MAGNESIUM: Magnesium: 2 mg/dL (ref 1.7–2.4)

## 2019-11-20 MED ORDER — IOHEXOL 300 MG/ML  SOLN
75.0000 mL | Freq: Once | INTRAMUSCULAR | Status: AC | PRN
  Administered 2019-11-20: 60 mL via INTRAVENOUS

## 2019-11-22 ENCOUNTER — Other Ambulatory Visit: Payer: Self-pay

## 2019-11-22 ENCOUNTER — Inpatient Hospital Stay (HOSPITAL_BASED_OUTPATIENT_CLINIC_OR_DEPARTMENT_OTHER): Payer: PPO | Admitting: Hematology

## 2019-11-22 ENCOUNTER — Encounter (HOSPITAL_COMMUNITY): Payer: Self-pay | Admitting: Hematology

## 2019-11-22 VITALS — BP 140/69 | HR 89 | Temp 96.8°F | Resp 18 | Wt 180.8 lb

## 2019-11-22 DIAGNOSIS — C099 Malignant neoplasm of tonsil, unspecified: Secondary | ICD-10-CM | POA: Diagnosis not present

## 2019-11-22 DIAGNOSIS — Z5111 Encounter for antineoplastic chemotherapy: Secondary | ICD-10-CM | POA: Diagnosis not present

## 2019-11-22 NOTE — Patient Instructions (Signed)
[place] at [facility] Discharge Instructions  You were seen today by Dr. Delton Coombes. He went over your recent results and scans. Please drink at least 4 protein shakes daily. You will be scheduled for a PET scan before your next visit. Dr. Delton Coombes will see you back in 4 weeks for labs and follow up.   Thank you for choosing [place] at [facility] to provide your oncology and hematology care.  To afford each patient quality time with our provider, please arrive at least 15 minutes before your scheduled appointment time.   If you have a lab appointment with the [facility] please come in thru the Main Entrance and check in at the main information desk  You need to re-schedule your appointment should you arrive 10 or more minutes late.  We strive to give you quality time with our providers, and arriving late affects you and other patients whose appointments are after yours.  Also, if you no show three or more times for appointments you may be dismissed from the clinic at the providers discretion.     Again, thank you for choosing [facility].  Our hope is that these requests will decrease the amount of time that you wait before being seen by our physicians.       _____________________________________________________________  Should you have questions after your visit to [facility], please contact our office at [phone] between the hours of 8:00 a.m. and 4:30 p.m.  Voicemails left after 4:00 p.m. will not be returned until the following business day.  For prescription refill requests, have your pharmacy contact our office and allow 72 hours.    Cancer Center Support Programs:   > Cancer Support Group  2nd Tuesday of the month 1pm-2pm, Journey Room

## 2019-11-22 NOTE — Progress Notes (Signed)
[place], [place] [address]   CLINIC:  Medical Oncology/Hematology  PCP:  [facility], [place] / [place] [address] [phone]   REASON FOR VISIT:  Follow-up for left tonsillar cancer  PRIOR THERAPY:  1. Cisplatin & Aloxi x 6 cycles from 08/16/2019 to 09/20/2019 2. Chemoradiation therapy from to 10/05/2019.  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[place])   07/26/2019 Cancer Staging   Staging form: Pharynx - HPV-Mediated Oropharynx, AJCC 8th Edition - Clinical stage from 07/26/2019: Stage I (cT2, cN1, cM0, p16+) - Signed by Derek Jack, MD on 07/26/2019   08/16/2019 -  Chemotherapy   The patient had palonosetron (ALOXI) injection 0.25 mg, 0.25 mg, Intravenous,  Once, 6 of 7 cycles Administration: 0.25 mg (08/16/2019), 0.25 mg (08/23/2019), 0.25 mg (08/30/2019), 0.25 mg (09/06/2019), 0.25 mg (09/13/2019), 0.25 mg (09/20/2019) CISplatin (PLATINOL) 84 mg in sodium chloride 0.9 % 250 mL chemo infusion, 40 mg/m2 = 84 mg, Intravenous,  Once, 6 of 7 cycles Administration: 84 mg (08/16/2019), 84 mg (08/23/2019), 84 mg (08/30/2019), 84 mg (09/06/2019), 84 mg (09/13/2019), 84 mg (09/20/2019) fosaprepitant (EMEND) 150 mg in sodium chloride 0.9 % 145 mL IVPB, 150 mg, Intravenous,  Once, 6 of 7 cycles Administration: 150 mg (08/16/2019), 150 mg (08/23/2019), 150 mg (08/30/2019), 150 mg (09/06/2019), 150 mg (09/13/2019), 150 mg (09/20/2019)  for chemotherapy treatment.      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 female, returns for routine follow-up of her left tonsillar cancer. Kaetlin was last seen on 10/26/2019.  Today she reports that she is doing well. She saw Dr. Benjamine Mola for her throat pain and ear pain. Her appetite is still depleted and her taste is still off along with the sensation that the back of her throat is coated with a film and she is drinking 3 protein shakes daily and forcing herself to eat. She continues having a dry cough which is stable. She denies numbness or tingling or N/V. She is drinking tea and coffee daily instead of water. She denies feeling terribly fatigued.  She completed her radiation therapy on 8/5. She is able to do all of her ADL's most days of the week, while on other days she has less stamina to walk through the grocery store.   REVIEW OF SYSTEMS:  Review of Systems  Constitutional: Positive for appetite change (depleted) and fatigue (moderate).  HENT:   Positive for sore throat (2/10 throat irritation).   Respiratory: Positive for cough (dry).   Gastrointestinal: Negative for nausea and vomiting.  All other systems reviewed and are negative.   PAST MEDICAL/SURGICAL HISTORY:  Past Medical History:  Diagnosis Date  . Anxiety about health 08/23/2019  . Arthritis   . GERD (gastroesophageal reflux disease)   . Hypothyroidism   . Hypothyroidism (acquired) 08/23/2019  . 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 Port-A-Cath in place 08/09/2019  . 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 Scoliosis   . Sinusitis   . 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   Past Surgical History:  Procedure Laterality Date  . NO PAST SURGERIES    . No prior surgery    . PORTACATH PLACEMENT Right 08/01/2019   Procedure: INSERTION PORT-A-CATH;  Surgeon: Aviva Signs, MD;  Location: [facility];   Service: General;  Laterality: Right;    SOCIAL HISTORY:  Social History   Socioeconomic History  . Marital status: Widowed    Spouse name: Not on file  . Number of children: 1  . Years of education: Not on file  . Highest education level: Not on file  Occupational History  . Occupation: RETIRED  Tobacco Use  . Smoking status: Former Research scientist (life sciences)  . Smokeless tobacco: Never Used  . Tobacco comment: some in her 30s and 30s  Vaping Use  . Vaping Use: Never used  Substance and Sexual Activity  . Alcohol use: Yes    Comment: on rare occasion "like twice a year"  . Drug use: Never  . Sexual activity: Not Currently  Other Topics Concern  . Not on file  Social History Narrative   Lives at home alone   Retired   Widow   Caffeine: about 4 cups coffee, 3 glasses of tea daily   Social Determinants of Health   Financial Resource Strain: Low Risk   . Difficulty of Paying Living Expenses: Not hard at all  Food Insecurity: No Food Insecurity  . Worried About Charity fundraiser in the Last Year: Never true  . Ran Out of Food in the Last Year: Never true  Transportation Needs: No Transportation Needs  . Lack of Transportation (Medical): No  . Lack of Transportation (Non-Medical): No  Physical Activity: Sufficiently Active  . Days of Exercise per Week: 5 days  . Minutes of Exercise per Session: 30 min  Stress: Stress Concern Present  . Feeling of Stress : Very much  Social Connections:   . Frequency of Communication with Friends and Family: Not on file  . Frequency of Social Gatherings with Friends and Family: Not on file  . Attends Religious Services: Not on file  . Active Member of Clubs or Organizations: Not on file  . Attends Archivist Meetings: Not on file  . Marital Status: Not on file  Intimate Partner Violence: Not At Risk  . Fear of Current or Ex-Partner: No  . Emotionally Abused: No  . Physically Abused: No  . Sexually Abused: No    FAMILY HISTORY:  Family  History  Problem Relation Age of Onset  . Lung cancer Mother        smoker  . Cancer Mother        Throat  . Lung cancer Father        smoker  . Diabetes Father   . CVA Brother   . Diabetes Brother   . Diabetes Maternal Grandmother   . Heart attack Paternal Grandfather   . Migraines Neg Hx   . Headache Neg Hx     CURRENT MEDICATIONS:  Current Outpatient Medications  Medication Sig Dispense Refill  . butalbital-acetaminophen-caffeine (FIORICET) 50-325-40 MG tablet Take 1-2 tablets by mouth every 6 (six) hours as needed for headache (severe). 10 tablet 0  . diphenhydrAMINE (BENADRYL) 25 mg capsule Take 25 mg by mouth at bedtime as needed.     Marland Kitchen esomeprazole (NEXIUM) 40 MG capsule Take  40 mg by mouth daily.     Marland Kitchen ibuprofen (ADVIL) 200 MG tablet Take 400-600 mg by mouth every 8 (eight) hours as needed for moderate pain.     Marland Kitchen levothyroxine (SYNTHROID) 50 MCG tablet Take 50 mcg by mouth daily. BRAND    . lidocaine (XYLOCAINE) 2 % solution 10 mLs every 4 (four) hours as needed.     . lidocaine-prilocaine (EMLA) cream Apply a small amount to port a cath site and cover with plastic wrap 1 hour prior to chemotherapy appointments 30 g 3  . magic mouthwash SOLN Take 5 mLs by mouth 3 (three) times daily as needed for mouth pain. 15 mL 6  . morphine (ROXANOL) 20 MG/ML concentrated solution Take 0.5 mLs (10 mg total) by mouth every 4 (four) hours as needed for severe pain. Mix 0.70ml in 29ml water and swish and spit for few minutes every 4 hours 30 mL 0  . nystatin (MYCOSTATIN) 100000 UNIT/ML suspension     . ondansetron (ZOFRAN ODT) 8 MG disintegrating tablet Take 1 tablet (8 mg total) by mouth every 8 (eight) hours as needed for nausea or vomiting. 45 tablet 2  . PREVIDENT 5000 BOOSTER PLUS 1.1 % PSTE APPLY WITH TOOTHBRUSH AT BEDTIME    . prochlorperazine (COMPAZINE) 10 MG tablet Take 1 tablet (10 mg total) by mouth every 6 (six) hours as needed (Nausea or vomiting). 30 tablet 1  . scopolamine  (TRANSDERM-SCOP) 1 MG/3DAYS Place 1 patch (1.5 mg total) onto the skin every 3 (three) days. 10 patch 12  . sucralfate (CARAFATE) 1 GM/10ML suspension Take 10 mLs (1 g total) by mouth 4 (four) times daily -  with meals and at bedtime. 420 mL 1   No current facility-administered medications for this visit.    ALLERGIES:  Allergies  Allergen Reactions  . Sulfamethoxazole-Trimethoprim Other (See Comments)    Headaches  . Penicillins Rash    20 years    PHYSICAL EXAM:  Performance status (ECOG): 1 - Symptomatic but completely ambulatory  Vitals:   11/22/19 1533  BP: 140/69  Pulse: 89  Resp: 18  Temp: (!) 96.8 F (36 C)  SpO2: 97%   Wt Readings from Last 3 Encounters:  11/22/19 180 lb 12.8 oz (82 kg)  10/26/19 189 lb 6.4 oz (85.9 kg)  10/11/19 194 lb 3.2 oz (88.1 kg)   Physical Exam Vitals reviewed.  Constitutional:      Appearance: Normal appearance.  Cardiovascular:     Rate and Rhythm: Normal rate and regular rhythm.     Pulses: Normal pulses.     Heart sounds: Normal heart sounds.  Pulmonary:     Effort: Pulmonary effort is normal.     Breath sounds: Normal breath sounds.  Lymphadenopathy:     Cervical: No cervical adenopathy.     Upper Body:     Right upper body: No supraclavicular, axillary or pectoral adenopathy.     Left upper body: No supraclavicular, axillary or pectoral adenopathy.     Lower Body: No right inguinal adenopathy. No left inguinal adenopathy.  Neurological:     General: No focal deficit present.     Mental Status: She is alert and oriented to person, place, and time.  Psychiatric:        Mood and Affect: Mood normal.        Behavior: Behavior normal.      LABORATORY DATA:  I have reviewed the labs as listed.  CBC Latest Ref Rng & Units 11/20/2019 10/26/2019  10/11/2019  WBC 4.0 - 10.5 K/uL 7.9 7.4 2.9(L)  Hemoglobin 12.0 - 15.0 g/dL 10.1(L) 10.1(L) 9.2(L)  Hematocrit 36 - 46 % 31.3(L) 31.1(L) 28.3(L)  Platelets 150 - 400 K/uL 243 324 300     CMP Latest Ref Rng & Units 11/20/2019 10/26/2019 10/11/2019  Glucose 70 - 99 mg/dL 114(H) 126(H) 108(H)  BUN 8 - 23 mg/dL 22 23 13   Creatinine 0.44 - 1.00 mg/dL 1.21(H) 1.22(H) 0.95  Sodium 135 - 145 mmol/L 137 135 136  Potassium 3.5 - 5.1 mmol/L 4.0 3.9 3.9  Chloride 98 - 111 mmol/L 99 96(L) 95(L)  CO2 22 - 32 mmol/L 28 28 30   Calcium 8.9 - 10.3 mg/dL 9.2 9.1 8.9  Total Protein 6.5 - 8.1 g/dL 7.2 7.0 6.4(L)  Total Bilirubin 0.3 - 1.2 mg/dL 0.7 0.6 0.5  Alkaline Phos 38 - 126 U/L 73 80 80  AST 15 - 41 U/L 18 18 14(L)  ALT 0 - 44 U/L 15 18 12     DIAGNOSTIC IMAGING:  I have independently reviewed the scans and discussed with the patient. CT SOFT TISSUE NECK W CONTRAST  Result Date: 11/21/2019 CLINICAL DATA:  Head neck cancer, assess treatment response. Chemotherapy and radiation therapy for squamous cell carcinoma of the left tonsil EXAM: CT NECK WITH CONTRAST TECHNIQUE: Multidetector CT imaging of the neck was performed using the standard protocol following the bolus administration of intravenous contrast. CONTRAST:  25mL OMNIPAQUE IOHEXOL 300 MG/ML  SOLN COMPARISON:  06/16/2019 CT.  07/18/2019 PET-CT FINDINGS: Pharynx and larynx: There is still asymmetric soft tissue fullness at the left tonsillar fossa, evaluation degraded by extensive streak artifact from dental amalgam. Non masslike architectural distortion of adjacent deep soft tissue planes which is likely treatment related. Salivary glands: Interval atrophy of salivary glands. Thyroid: Unchanged exophytic nodule from the left lobe thyroid, extending posteriorly and measuring 12 mm. Lymph nodes: 2 PET positive lymph nodes in the upper left jugular chain on comparison. These nodes have decreased in size and no longer meet threshold for enlargement. A round left level 2 B lymph node measures 6 mm and has a similar degree of indistinct margination. Similar size of homogeneous right upper jugular chain lymph nodes which are likely benign.  Vascular: Atheromatous plaque Limited intracranial: Negative Visualized orbits: Negative Mastoids and visualized paranasal sinuses: Partial left mastoid opacification, likely treatment related. No nasopharyngeal mass. Skeleton: Thoracic scoliosis. Multilevel degenerative disease. No acute or aggressive find Upper chest: Porta catheter in place.  No apical nodules. IMPRESSION: 1. Continued asymmetric prominence of left tonsillar soft tissue, recommend direct inspection. 2. The previously PET positive left jugular lymph nodes have normalized in size. 3. Partial left mastoid opacification, likely treatment related. Electronically Signed   By: Monte Fantasia M.D.   On: 11/21/2019 06:59     ASSESSMENT:  1.Squamous cell carcinoma, p16 positiveof the left tonsil: - 06/29/2019 Biopsy A. LYMPH NODE, LEFT NECK, NEEDLE CORE BIOPSY:  - Squamous cell carcinoma, p16 positive.  COMMENT:  The carcinoma is positive with p16, p40, cytokeratin 5/6 and p63 consistent with squamous cell carcinoma. The carcinoma is negative with Epstein-Barr virus (EBV), TTF-1 and Napsin-A. -PET scan on 07/18/2019 showed left level 2A lymph node, 1 cm. Small rounded lymph node 8 mm at level 2B on the left side. Intense palatine tonsillar uptake with asymmetric fullness of tonsillar tissue and symmetrical FDG activity with masslike changes on the left side measuring 2.2 x 1.7 cm compared to the right. SUV 16.2. Small focus of increased activity  in the right neck adjacent to or within the small nodule in the right hemithyroid. -An ultrasound of the thyroid gland showed heterogeneous thyroid with no masses. -She was evaluated by Dr.Yanagihara. -XRT with weekly cisplatin started on 08/16/2019.Week 6 of cisplatin on 09/20/2019. -XRT completed on 10/05/2019.   PLAN:  1. 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  There is asymmetric prominence of the left  tonsillar soft tissue.  However oropharyngeal examination showed complete resolution of the tonsillar lesion.  No palpable adenopathy in the neck.  Previously PET positive left jugular lymph node has normalized in size on the CT scan. -I have recommended a PET CT scan in 4 months along with a TSH level.  2. Hypothyroidism: -Continue Synthroid daily.  3.Throat pain: -Throat pain has improved.  She is not requiring any ibuprofen.  She just has occasional throat discomfort.  4. Weight loss: -She has lost a total of 33 pounds since May.  She lost 9 pounds since last visit. -She is drinking about 3 cans of boost plus daily.  She is not eating any solid foods because of lack of taste and appetite.  She was encouraged to increase to 4 cans of boost plus daily.  She does not want to have any feeding tube placed.  She was told to closely follow-up with Jennet Maduro, our dietitian.  5.  Hypomagnesemia: -Magnesium today is 2.0.    Orders placed this encounter:  No orders of the defined types were placed in this encounter.    Derek Jack, MD [place] [phone]   I, Milinda Antis, am acting as a scribe for Dr. Sanda Linger.  I, Derek Jack MD, have reviewed the above documentation for accuracy and completeness, and I agree with the above.

## 2019-11-23 DIAGNOSIS — H9012 Conductive hearing loss, unilateral, left ear, with unrestricted hearing on the contralateral side: Secondary | ICD-10-CM | POA: Diagnosis not present

## 2019-11-23 DIAGNOSIS — H6982 Other specified disorders of Eustachian tube, left ear: Secondary | ICD-10-CM | POA: Diagnosis not present

## 2019-11-23 DIAGNOSIS — H6522 Chronic serous otitis media, left ear: Secondary | ICD-10-CM | POA: Diagnosis not present

## 2019-12-08 ENCOUNTER — Ambulatory Visit (HOSPITAL_COMMUNITY): Payer: PPO

## 2019-12-08 NOTE — Progress Notes (Signed)
Nutrition Follow-up:  Patient with left tonsillar cancer, p 16 positive.  Patient completed radiation (8/5) and chemotherapy (7/21).  Spoke with patient for nutrition follow-up.  Patient reports that taste buds are coming back slowly for some things.  Has been able to taste home grown tomatoes.  Has tried chicken with mushroom gravy.  Eating soups and shakes are tasting better.  Had spaghetti recently that daughter prepared that she has able to enjoy with extra tomato sauce.     Medications: reviewed  Labs: reviewed  Anthropometrics:   Weight 180 lb 12.8 oz decreased from 189 lb on 8/26   NUTRITION DIAGNOSIS: Predicted suboptimal energy intake continues    INTERVENTION:  Discussed ways to maximize flavors that patient can taste and foods to try.   Offered words of encouragement.  Discussed that LCSW available if needed.  Patient aware and will reach out to LCSW if needed.   Patient has contact information    MONITORING, EVALUATION, GOAL: weight trends, intake   NEXT VISIT: October 29 phone f/u  Kristi Weiss B. Zenia Resides, [place], [place] Registered Dietitian [phone] (mobile)

## 2019-12-13 DIAGNOSIS — H6982 Other specified disorders of Eustachian tube, left ear: Secondary | ICD-10-CM | POA: Diagnosis not present

## 2019-12-13 DIAGNOSIS — H903 Sensorineural hearing loss, bilateral: Secondary | ICD-10-CM | POA: Diagnosis not present

## 2019-12-13 DIAGNOSIS — H7202 Central perforation of tympanic membrane, left ear: Secondary | ICD-10-CM | POA: Diagnosis not present

## 2019-12-18 ENCOUNTER — Inpatient Hospital Stay (HOSPITAL_COMMUNITY): Payer: PPO | Attending: Hematology

## 2019-12-18 ENCOUNTER — Other Ambulatory Visit: Payer: Self-pay

## 2019-12-18 DIAGNOSIS — Z23 Encounter for immunization: Secondary | ICD-10-CM | POA: Insufficient documentation

## 2019-12-18 DIAGNOSIS — Z79899 Other long term (current) drug therapy: Secondary | ICD-10-CM | POA: Insufficient documentation

## 2019-12-18 DIAGNOSIS — Z87891 Personal history of nicotine dependence: Secondary | ICD-10-CM | POA: Diagnosis not present

## 2019-12-18 DIAGNOSIS — C099 Malignant neoplasm of tonsil, unspecified: Secondary | ICD-10-CM | POA: Diagnosis not present

## 2019-12-18 DIAGNOSIS — K219 Gastro-esophageal reflux disease without esophagitis: Secondary | ICD-10-CM | POA: Diagnosis not present

## 2019-12-18 DIAGNOSIS — E039 Hypothyroidism, unspecified: Secondary | ICD-10-CM | POA: Diagnosis not present

## 2019-12-18 LAB — COMPREHENSIVE METABOLIC PANEL
ALT: 13 U/L (ref 0–44)
AST: 18 U/L (ref 15–41)
Albumin: 3.9 g/dL (ref 3.5–5.0)
Alkaline Phosphatase: 78 U/L (ref 38–126)
Anion gap: 10 (ref 5–15)
BUN: 23 mg/dL (ref 8–23)
CO2: 28 mmol/L (ref 22–32)
Calcium: 9.2 mg/dL (ref 8.9–10.3)
Chloride: 97 mmol/L — ABNORMAL LOW (ref 98–111)
Creatinine, Ser: 1.14 mg/dL — ABNORMAL HIGH (ref 0.44–1.00)
GFR, Estimated: 48 mL/min — ABNORMAL LOW (ref >60)
Glucose, Bld: 110 mg/dL — ABNORMAL HIGH (ref 70–99)
Potassium: 3.9 mmol/L (ref 3.5–5.1)
Sodium: 135 mmol/L (ref 135–145)
Total Bilirubin: 0.8 mg/dL (ref 0.3–1.2)
Total Protein: 7.2 g/dL (ref 6.5–8.1)

## 2019-12-18 LAB — CBC WITH DIFFERENTIAL/PLATELET
Abs Immature Granulocytes: 0.01 10*3/uL (ref 0.00–0.07)
Basophils Absolute: 0 10*3/uL (ref 0.0–0.1)
Basophils Relative: 0 %
Eosinophils Absolute: 0.1 10*3/uL (ref 0.0–0.5)
Eosinophils Relative: 1 %
HCT: 32.3 % — ABNORMAL LOW (ref 36.0–46.0)
Hemoglobin: 10.8 g/dL — ABNORMAL LOW (ref 12.0–15.0)
Immature Granulocytes: 0 %
Lymphocytes Relative: 19 %
Lymphs Abs: 1.1 10*3/uL (ref 0.7–4.0)
MCH: 33.1 pg (ref 26.0–34.0)
MCHC: 33.4 g/dL (ref 30.0–36.0)
MCV: 99.1 fL (ref 80.0–100.0)
Monocytes Absolute: 0.4 10*3/uL (ref 0.1–1.0)
Monocytes Relative: 7 %
Neutro Abs: 4.1 10*3/uL (ref 1.7–7.7)
Neutrophils Relative %: 73 %
Platelets: 255 10*3/uL (ref 150–400)
RBC: 3.26 MIL/uL — ABNORMAL LOW (ref 3.87–5.11)
RDW: 11.9 % (ref 11.5–15.5)
WBC: 5.6 10*3/uL (ref 4.0–10.5)
nRBC: 0 % (ref 0.0–0.2)

## 2019-12-18 LAB — TSH: TSH: 2.203 u[IU]/mL (ref 0.350–4.500)

## 2019-12-25 ENCOUNTER — Other Ambulatory Visit: Payer: Self-pay

## 2019-12-25 ENCOUNTER — Ambulatory Visit (HOSPITAL_COMMUNITY)
Admission: RE | Admit: 2019-12-25 | Discharge: 2019-12-25 | Disposition: A | Discharge status: Home / Self Care | Payer: PPO | Source: Ambulatory Visit | Attending: Hematology | Admitting: Hematology

## 2019-12-25 DIAGNOSIS — C099 Malignant neoplasm of tonsil, unspecified: Secondary | ICD-10-CM | POA: Diagnosis not present

## 2019-12-25 DIAGNOSIS — R590 Localized enlarged lymph nodes: Secondary | ICD-10-CM | POA: Diagnosis not present

## 2019-12-25 DIAGNOSIS — C760 Malignant neoplasm of head, face and neck: Secondary | ICD-10-CM | POA: Diagnosis not present

## 2019-12-25 MED ORDER — FLUDEOXYGLUCOSE F - 18 (FDG) INJECTION
11.9600 | Freq: Once | INTRAVENOUS | Status: AC | PRN
  Administered 2019-12-25: 11.96 via INTRAVENOUS

## 2019-12-28 ENCOUNTER — Inpatient Hospital Stay (HOSPITAL_COMMUNITY): Payer: PPO | Admitting: Hematology

## 2019-12-28 ENCOUNTER — Other Ambulatory Visit: Payer: Self-pay

## 2019-12-28 VITALS — BP 143/62 | HR 89 | Temp 97.2°F | Resp 18 | Wt 184.0 lb

## 2019-12-28 DIAGNOSIS — C099 Malignant neoplasm of tonsil, unspecified: Secondary | ICD-10-CM | POA: Diagnosis not present

## 2019-12-28 DIAGNOSIS — Z23 Encounter for immunization: Secondary | ICD-10-CM

## 2019-12-28 MED ORDER — INFLUENZA VAC A&B SA ADJ QUAD 0.5 ML IM PRSY
0.5000 mL | PREFILLED_SYRINGE | Freq: Once | INTRAMUSCULAR | Status: AC
  Administered 2019-12-28: 0.5 mL via INTRAMUSCULAR
  Filled 2019-12-28: qty 0.5

## 2019-12-28 MED ORDER — INFLUENZA VAC A&B SA ADJ QUAD 0.5 ML IM PRSY: 0.5000 mL | PREFILLED_SYRINGE | Freq: Once | INTRAMUSCULAR | Status: DC

## 2019-12-28 NOTE — Patient Instructions (Signed)
[place] at [facility] Discharge Instructions  You were seen today by Dr. Delton Coombes. He went over your recent results and scans. Make an appointment with Dr. Benjamine Mola to have a laryngoscopy done. You will be scheduled for a PET scan before your next visit. Dr. Delton Coombes will see you back in 3 months for labs and follow up.   Thank you for choosing [place] at [facility] to provide your oncology and hematology care.  To afford each patient quality time with our provider, please arrive at least 15 minutes before your scheduled appointment time.   If you have a lab appointment with the [facility] please come in thru the Main Entrance and check in at the main information desk  You need to re-schedule your appointment should you arrive 10 or more minutes late.  We strive to give you quality time with our providers, and arriving late affects you and other patients whose appointments are after yours.  Also, if you no show three or more times for appointments you may be dismissed from the clinic at the providers discretion.     Again, thank you for choosing [facility].  Our hope is that these requests will decrease the amount of time that you wait before being seen by our physicians.       _____________________________________________________________  Should you have questions after your visit to [facility], please contact our office at [phone] between the hours of 8:00 a.m. and 4:30 p.m.  Voicemails left after 4:00 p.m. will not be returned until the following business day.  For prescription refill requests, have your pharmacy contact our office and allow 72 hours.    Cancer Center Support Programs:   > Cancer Support Group  2nd Tuesday of the month 1pm-2pm, Journey Room

## 2019-12-28 NOTE — Progress Notes (Signed)
[name], [name]   CLINIC:  Medical Oncology/Hematology  PCP:  Kristi Weiss, [place] / Kristi Weiss [phone]   REASON FOR VISIT:  Follow-up for left tonsillar cancer  PRIOR THERAPY:  1. Cisplatin & Aloxi x 6 cycles from 08/16/2019 to 09/20/2019 2. Chemoradiation therapy from to 10/05/2019.  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[place])  07/26/2019 Initial Diagnosis   Squamous cell carcinoma of left tonsil ([employer])   07/26/2019 Cancer Staging   Staging form: Pharynx - HPV-Mediated Oropharynx, AJCC 8th Edition - Clinical stage from 07/26/2019: Stage I (cT2, cN1, cM0, p16+) - Signed by Derek Jack, MD on 07/26/2019   08/16/2019 -  Chemotherapy   The patient had palonosetron (ALOXI) injection 0.25 mg, 0.25 mg, Intravenous,  Once, 6 of 7 cycles Administration: 0.25 mg (08/16/2019), 0.25 mg (08/23/2019), 0.25 mg (08/30/2019), 0.25 mg (09/06/2019), 0.25 mg (09/13/2019), 0.25 mg (09/20/2019) CISplatin (PLATINOL) 84 mg in sodium chloride 0.9 % 250 mL chemo infusion, 40 mg/m2 = 84 mg, Intravenous,  Once, 6 of 7 cycles Administration: 84 mg (08/16/2019), 84 mg (08/23/2019), 84 mg (08/30/2019), 84 mg (09/06/2019), 84 mg (09/13/2019), 84 mg (09/20/2019) fosaprepitant (EMEND) 150 mg in sodium chloride 0.9 % 145 mL IVPB, 150 mg, Intravenous,  Once, 6 of 7 cycles Administration: 150 mg (08/16/2019), 150 mg (08/23/2019), 150 mg (08/30/2019), 150 mg (09/06/2019), 150 mg (09/13/2019), 150 mg (09/20/2019)  for chemotherapy treatment.      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, a 74 y.o. female, returns for routine follow-up of her left tonsillar cancer. [name] was last seen on 11/22/2019.   Today she reports feeling okay. She reports having a fuzzy sensation in the back of her throat, though it is not painful. She went to see Dr. Benjamine Mola for her left ear tinnitus and was discovered to have middle ear effusion. Her appetite is slowly improving, drinking 2-3 shakes and soups. Her taste is still off and some textures still cause her to gag. She continues having difficulty eating bread. She denies numbness or tingling. Her energy levels are still low and is not able to do all that she wants to do.   REVIEW OF SYSTEMS:  Review of Systems  Constitutional: Positive for appetite change (50%) and fatigue (70%).  HENT:   Positive for lump/mass (mass under chin). Negative for sore throat.   Neurological: Positive for headaches. Negative for numbness.  All other systems reviewed and are negative.   PAST MEDICAL/SURGICAL HISTORY:  Past Medical History:  Diagnosis Date  . Anxiety about health 08/23/2019  . Arthritis   . GERD (gastroesophageal reflux disease)   . Hypothyroidism   . Hypothyroidism (acquired) 08/23/2019  . 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 Port-A-Cath in place 08/09/2019  . 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 Scoliosis   . Sinusitis   . 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   Past Surgical History:  Procedure  Laterality Date  . NO PAST SURGERIES    . No prior surgery    . PORTACATH PLACEMENT Right 08/01/2019   Procedure: INSERTION PORT-A-CATH;  Surgeon: Aviva Signs, MD;  Location: [facility];  Service: General;  Laterality: Right;    SOCIAL HISTORY:  Social History   Socioeconomic History  . Marital status: Widowed    Spouse name: Not on file  . Number of  children: 1  . Years of education: Not on file  . Highest education level: Not on file  Occupational History  . Occupation: RETIRED  Tobacco Use  . Smoking status: Former Research scientist (life sciences)  . Smokeless tobacco: Never Used  . Tobacco comment: some in her 23s and 30s  Vaping Use  . Vaping Use: Never used  Substance and Sexual Activity  . Alcohol use: Yes    Comment: on rare occasion "like twice a year"  . Drug use: Never  . Sexual activity: Not Currently  Other Topics Concern  . Not on file  Social History Narrative   Lives at home alone   Retired   Widow   Caffeine: about 4 cups coffee, 3 glasses of tea daily   Social Determinants of Health   Financial Resource Strain: Low Risk   . Difficulty of Paying Living Expenses: Not hard at all  Food Insecurity: No Food Insecurity  . Worried About Charity fundraiser in the Last Year: Never true  . Ran Out of Food in the Last Year: Never true  Transportation Needs: No Transportation Needs  . Lack of Transportation (Medical): No  . Lack of Transportation (Non-Medical): No  Physical Activity: Sufficiently Active  . Days of Exercise per Week: 5 days  . Minutes of Exercise per Session: 30 min  Stress: Stress Concern Present  . Feeling of Stress : Very much  Social Connections:   . Frequency of Communication with Friends and Family: Not on file  . Frequency of Social Gatherings with Friends and Family: Not on file  . Attends Religious Services: Not on file  . Active Member of Clubs or Organizations: Not on file  . Attends Archivist Meetings: Not on file  . Marital Status: Not on file  Intimate Partner Violence: Not At Risk  . Fear of Current or Ex-Partner: No  . Emotionally Abused: No  . Physically Abused: No  . Sexually Abused: No    FAMILY HISTORY:  Family History  Problem Relation Age of Onset  . Lung cancer Mother        smoker  . Cancer Mother        Throat  . Lung cancer Father        smoker  . Diabetes Father   .  CVA Brother   . Diabetes Brother   . Diabetes Maternal Grandmother   . Heart attack Paternal Grandfather   . Migraines Neg Hx   . Headache Neg Hx     CURRENT MEDICATIONS:  Current Outpatient Medications  Medication Sig Dispense Refill  . esomeprazole (NEXIUM) 40 MG capsule Take 40 mg by mouth daily.     Marland Kitchen ibuprofen (ADVIL) 200 MG tablet Take 400-600 mg by mouth every 8 (eight) hours as needed for moderate pain.     Marland Kitchen levothyroxine (SYNTHROID) 50 MCG tablet Take 50 mcg by mouth daily. BRAND    . PREVIDENT 5000 BOOSTER PLUS 1.1 % PSTE APPLY WITH TOOTHBRUSH AT BEDTIME    . lidocaine-prilocaine (EMLA) cream Apply a small amount to port a cath  site and cover with plastic wrap 1 hour prior to chemotherapy appointments (Patient not taking: Reported on 12/28/2019) 30 g 3   Current Facility-Administered Medications  Medication Dose Route Frequency Provider Last Rate Last Admin  . influenza vaccine adjuvanted (FLUAD) injection 0.5 mL  0.5 mL Intramuscular Once Derek Jack, MD        ALLERGIES:  Allergies  Allergen Reactions  . Sulfamethoxazole-Trimethoprim Other (See Comments)    Headaches  . Penicillins Rash    20 years    PHYSICAL EXAM:  Performance status (ECOG): 1 - Symptomatic but completely ambulatory  Vitals:   12/28/19 1358  BP: (!) 143/62  Pulse: 89  Resp: 18  Temp: (!) 97.2 F (36.2 C)  SpO2: 98%   Wt Readings from Last 3 Encounters:  12/28/19 184 lb (83.5 kg)  11/22/19 180 lb 12.8 oz (82 kg)  10/26/19 189 lb 6.4 oz (85.9 kg)   Physical Exam HENT:     Mouth/Throat:     Lips: No lesions.     Mouth: No oral lesions.     Dentition: No gum lesions.     Tongue: No lesions.     Palate: No mass.  Lymphadenopathy:     Head:     Right side of head: No submental, submandibular or tonsillar adenopathy.     Left side of head: No submental, submandibular or tonsillar adenopathy.     Cervical: No cervical adenopathy.     Right cervical: No superficial or  posterior cervical adenopathy.    Left cervical: No superficial or posterior cervical adenopathy.     Upper Body:     Right upper body: No supraclavicular adenopathy.     Left upper body: No supraclavicular adenopathy.      LABORATORY DATA:  I have reviewed the labs as listed.  CBC Latest Ref Rng & Units 12/18/2019 11/20/2019 10/26/2019  WBC 4.0 - 10.5 K/uL 5.6 7.9 7.4  Hemoglobin 12.0 - 15.0 g/dL 10.8(L) 10.1(L) 10.1(L)  Hematocrit 36 - 46 % 32.3(L) 31.3(L) 31.1(L)  Platelets 150 - 400 K/uL 255 243 324   CMP Latest Ref Rng & Units 12/18/2019 11/20/2019 10/26/2019  Glucose 70 - 99 mg/dL 110(H) 114(H) 126(H)  BUN 8 - 23 mg/dL 23 22 23   Creatinine 0.44 - 1.00 mg/dL 1.14(H) 1.21(H) 1.22(H)  Sodium 135 - 145 mmol/L 135 137 135  Potassium 3.5 - 5.1 mmol/L 3.9 4.0 3.9  Chloride 98 - 111 mmol/L 97(L) 99 96(L)  CO2 22 - 32 mmol/L 28 28 28   Calcium 8.9 - 10.3 mg/dL 9.2 9.2 9.1  Total Protein 6.5 - 8.1 g/dL 7.2 7.2 7.0  Total Bilirubin 0.3 - 1.2 mg/dL 0.8 0.7 0.6  Alkaline Phos 38 - 126 U/L 78 73 80  AST 15 - 41 U/L 18 18 18   ALT 0 - 44 U/L 13 15 18     DIAGNOSTIC IMAGING:  I have independently reviewed the scans and discussed with the patient. NM PET Image Restag (PS) Skull Base To Thigh  Result Date: 12/26/2019 CLINICAL DATA:  Subsequent treatment strategy for head and neck carcinoma. EXAM: NUCLEAR MEDICINE PET SKULL BASE TO THIGH TECHNIQUE: 11.96 mCi F-18 FDG was injected intravenously. Full-ring PET imaging was performed from the skull base to thigh after the radiotracer. CT data was obtained and used for attenuation correction and anatomic localization. Fasting blood glucose: 94 mg/dl COMPARISON:  Neck CT 11/20/2019 FINDINGS: Mediastinal blood pool activity: SUV max 2.53 Liver activity: SUV max 3 NECK: Hypermetabolic RIGHT level 2 lymph  node measures 5 mm short axis (image 86) with SUV max equal 5.5. Slightly more superior and superficial sternocleidomastoid (image 76) is second small  hypermetabolic node measuring 5 mm with SUV max equal 4.3. Asymmetric hypermetabolic activity in the RIGHT lingual tonsil with SUV max equal 7.8. Similar activity to comparison exam. Marked reduction in activity of the LEFT lingual tonsil region compared to prior (SUV max equal 4.9 compared SUV max equal 16.2). Resolution of the hypermetabolic enlarged LEFT level 2 lymph nodes. Asymmetric hypermetabolic tissue posterior to the RIGHT arytenoid cartilage measuring approximately 6 mm on image 97 SUV max equal 5.1. This is favored thyroid tissue. Incidental CT findings: Port in the anterior chest wall with tip in distal SVC. CHEST: No hypermetabolic mediastinal or hilar nodes. No suspicious pulmonary nodules on the CT scan. Incidental CT findings: none ABDOMEN/PELVIS: No abnormal hypermetabolic activity within the liver, pancreas, adrenal glands, or spleen. No hypermetabolic lymph nodes in the abdomen or pelvis. Incidental CT findings: none SKELETON: No focal hypermetabolic activity to suggest skeletal metastasis. Incidental CT findings: none IMPRESSION: 1. Asymmetric hypermetabolic activity in the RIGHT lingual tonsil. Activity is similar to comparison exam. Marked decreased activity in the LEFT lingual tonsil. 2. Two hypermetabolic RIGHT level 2 lymph nodes. Reactive nodes versus metastatic nodes. 3. Resolution of LEFT level 2 hypermetabolic nodes. 4. No distant metastatic disease. Electronically Signed   By: Suzy Bouchard M.D.   On: 12/26/2019 10:06     ASSESSMENT:  1.Squamous cell carcinoma, p16 positiveof the left tonsil: - 06/29/2019 Biopsy A. LYMPH NODE, LEFT NECK, NEEDLE CORE BIOPSY:  - Squamous cell carcinoma, p16 positive.  COMMENT:  The carcinoma is positive with p16, p40, cytokeratin 5/6 and p63 consistent with squamous cell carcinoma. The carcinoma is negative with Epstein-Barr virus (EBV), TTF-1 and Napsin-A. -PET scan on 07/18/2019 showed left level 2A lymph node, 1 cm. Small rounded  lymph node 8 mm at level 2B on the left side. Intense palatine tonsillar uptake with asymmetric fullness of tonsillar tissue and symmetrical FDG activity with masslike changes on the left side measuring 2.2 x 1.7 cm compared to the right. SUV 16.2. Small focus of increased activity in the right neck adjacent to or within the small nodule in the right hemithyroid. -An ultrasound of the thyroid gland showed heterogeneous thyroid with no masses. -She was evaluated by Dr.Yanagihara. -XRT with weekly cisplatin started on 08/16/2019.Week 6 of cisplatin on 09/20/2019. -XRT completed on 10/05/2019. -PET CT scan on 12/25/2019 showed asymmetric hypermetabolic activity in the right lingual tonsil, similar to pretreatment PET scan.  Hypermetabolic right level 2 lymph node measures 5 mm with SUV 5.5.  A second lymph node measures 5 mm with SUV 4.3.  Complete resolution of the left tonsil, and left level 2 lymph nodes.   PLAN:  1. 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  Asymmetric hypermetabolic activity in the right lingual tonsil is stable from prior scan.  2 hypermetabolic right level 2 lymph nodes, small and reactive rather than metastatic.  Complete resolution of left tonsillar mass and left level 2 metabolic lymph nodes. -Recommended to follow-up with Dr. Benjamine Mola for endoscopic examination. -Recommend follow-up in 3 months with PET CT scan.  2. Hypothyroidism: -Continue Synthroid daily.  TSH is 2.2.  3.Throat pain: -She does not have any pain at this time.  She occasionally feels that foreign body stuck in the back of the throat.  4. Weight  loss: -She lost about 35 pounds since the start of therapy.  However lately her weight has been stable.  She is eating certain kinds of foods without any problems.  She has trouble eating breads. -She is drinking 3 cans of boost/Ensure daily.    Orders placed this  encounter:  No orders of the defined types were placed in this encounter.    Derek Jack, MD [place] [phone]   I, Milinda Antis, am acting as a scribe for Dr. Sanda Linger.  I, Derek Jack MD, have reviewed the above documentation for accuracy and completeness, and I agree with the above.

## 2019-12-28 NOTE — Progress Notes (Signed)
Patient received high dose Flu injection per Dr. Delton Coombes orders.  Pt tolerated procedure well with no complaints verbalized.  Site covered with Band-Aid.  Pt sent home ambulatory.

## 2019-12-29 ENCOUNTER — Ambulatory Visit (HOSPITAL_COMMUNITY): Payer: PPO

## 2019-12-29 NOTE — Progress Notes (Signed)
Nutrition Follow-up:   Patient with left tonsillar cancer, p 16 positive.  Patient completed radiation (8/5) and chemotherapy (7/21).    Spoke with patient via phone for follow-up.  Patient reports that she is trying more foods.  Still does not have much of an appetite, taste is slowly coming back.  Still feels like she has to clear her throat often.  Reports trying barbecue sandwich (ate few bites), fresh tomatoes, salad but couldn't tolerate much of lettuce, pasta, pizza, cooked greens, cornbread.  Drinks about 2-3 ensure plus per day.     Medications: reviewed  Labs: reviewed  Anthropometrics:   Weight increased to 184 lb from 180 lb   NUTRITION DIAGNOSIS: Predicted suboptimal energy intake improving    INTERVENTION:  Continue high calorie shakes as able. Continue trying different foods and textures. Continue high calorie, high protein foods to prevent weight loss    MONITORING, EVALUATION, GOAL: weight trends, intake   NEXT VISIT: Dec 10 phone f/u   Elvera Almario B. Zenia Resides, [place], [place] Registered Dietitian [phone] (mobile)

## 2019-12-30 DIAGNOSIS — E063 Autoimmune thyroiditis: Secondary | ICD-10-CM | POA: Diagnosis not present

## 2019-12-30 DIAGNOSIS — E7849 Other hyperlipidemia: Secondary | ICD-10-CM | POA: Diagnosis not present

## 2019-12-30 DIAGNOSIS — I10 Essential (primary) hypertension: Secondary | ICD-10-CM | POA: Diagnosis not present

## 2020-01-01 ENCOUNTER — Other Ambulatory Visit (HOSPITAL_COMMUNITY): Payer: Self-pay | Admitting: *Deleted

## 2020-01-01 MED ORDER — PROCHLORPERAZINE MALEATE 10 MG PO TABS: 10.0000 mg | ORAL_TABLET | Freq: Four times a day (QID) | ORAL | 2 refills | Status: DC | PRN

## 2020-01-15 DIAGNOSIS — H7202 Central perforation of tympanic membrane, left ear: Secondary | ICD-10-CM | POA: Diagnosis not present

## 2020-01-15 DIAGNOSIS — Z85819 Personal history of malignant neoplasm of unspecified site of lip, oral cavity, and pharynx: Secondary | ICD-10-CM | POA: Diagnosis not present

## 2020-01-30 DIAGNOSIS — E063 Autoimmune thyroiditis: Secondary | ICD-10-CM | POA: Diagnosis not present

## 2020-01-30 DIAGNOSIS — E7849 Other hyperlipidemia: Secondary | ICD-10-CM | POA: Diagnosis not present

## 2020-01-30 DIAGNOSIS — I10 Essential (primary) hypertension: Secondary | ICD-10-CM | POA: Diagnosis not present

## 2020-02-08 DIAGNOSIS — C099 Malignant neoplasm of tonsil, unspecified: Secondary | ICD-10-CM | POA: Diagnosis not present

## 2020-02-09 ENCOUNTER — Ambulatory Visit (HOSPITAL_COMMUNITY): Payer: PPO

## 2020-02-09 NOTE — Progress Notes (Signed)
Nutrition Follow-up:  Patient with tonsillar cancer, p 16 positive.  Patient completed radiation (8/5) and chemotherapy (7/21).   Spoke with patient via phone for nutrition follow-up.  Patient reports that symptoms are about the same.  Still having taste alterations, feels like film in mouth.  MDs have looked in mouth and says everything is normal.  Not so much dry mouth.  Appetite is fair but still trying to eat.  Eating chopped meats, vegetables. Some things irritate her mouth. Drinks 2 ensure plus per day. Recently had small amount of steak.      Medications: reviewed  Labs: reviewed  Anthropometrics:   Weight 179 lb at [facility] this week  184 lb (10/28)   NUTRITION DIAGNOSIS: Predicted suboptimal energy intake stable   INTERVENTION:  Continue experimenting with different foods, flavors and textures.   Continue oral nutrition supplements. Patient has contact information    MONITORING, EVALUATION, GOAL: weight trends, intake   NEXT VISIT: Feb 4th phone f/u  Eugenio Dollins B. Zenia Resides, [place], [place] Registered Dietitian [phone] (mobile)

## 2020-03-30 DIAGNOSIS — E063 Autoimmune thyroiditis: Secondary | ICD-10-CM | POA: Diagnosis not present

## 2020-03-30 DIAGNOSIS — E7849 Other hyperlipidemia: Secondary | ICD-10-CM | POA: Diagnosis not present

## 2020-03-30 DIAGNOSIS — I10 Essential (primary) hypertension: Secondary | ICD-10-CM | POA: Diagnosis not present

## 2020-04-01 ENCOUNTER — Inpatient Hospital Stay (HOSPITAL_COMMUNITY): Payer: PPO | Attending: Hematology

## 2020-04-01 ENCOUNTER — Ambulatory Visit (HOSPITAL_COMMUNITY)
Admission: RE | Admit: 2020-04-01 | Discharge: 2020-04-01 | Disposition: A | Discharge status: Home / Self Care | Payer: PPO | Source: Ambulatory Visit | Attending: Hematology | Admitting: Hematology

## 2020-04-01 ENCOUNTER — Other Ambulatory Visit: Payer: Self-pay

## 2020-04-01 DIAGNOSIS — C099 Malignant neoplasm of tonsil, unspecified: Secondary | ICD-10-CM | POA: Insufficient documentation

## 2020-04-01 DIAGNOSIS — E039 Hypothyroidism, unspecified: Secondary | ICD-10-CM | POA: Diagnosis not present

## 2020-04-01 LAB — COMPREHENSIVE METABOLIC PANEL
ALT: 14 U/L (ref 0–44)
AST: 15 U/L (ref 15–41)
Albumin: 4 g/dL (ref 3.5–5.0)
Alkaline Phosphatase: 67 U/L (ref 38–126)
Anion gap: 7 (ref 5–15)
BUN: 19 mg/dL (ref 8–23)
CO2: 29 mmol/L (ref 22–32)
Calcium: 9.3 mg/dL (ref 8.9–10.3)
Chloride: 101 mmol/L (ref 98–111)
Creatinine, Ser: 1.09 mg/dL — ABNORMAL HIGH (ref 0.44–1.00)
GFR, Estimated: 54 mL/min — ABNORMAL LOW (ref >60)
Glucose, Bld: 94 mg/dL (ref 70–99)
Potassium: 4.1 mmol/L (ref 3.5–5.1)
Sodium: 137 mmol/L (ref 135–145)
Total Bilirubin: 0.7 mg/dL (ref 0.3–1.2)
Total Protein: 7.2 g/dL (ref 6.5–8.1)

## 2020-04-01 LAB — CBC WITH DIFFERENTIAL/PLATELET
Abs Immature Granulocytes: 0.01 10*3/uL (ref 0.00–0.07)
Basophils Absolute: 0 10*3/uL (ref 0.0–0.1)
Basophils Relative: 1 %
Eosinophils Absolute: 0.1 10*3/uL (ref 0.0–0.5)
Eosinophils Relative: 1 %
HCT: 36 % (ref 36.0–46.0)
Hemoglobin: 11.7 g/dL — ABNORMAL LOW (ref 12.0–15.0)
Immature Granulocytes: 0 %
Lymphocytes Relative: 20 %
Lymphs Abs: 1.2 10*3/uL (ref 0.7–4.0)
MCH: 30.7 pg (ref 26.0–34.0)
MCHC: 32.5 g/dL (ref 30.0–36.0)
MCV: 94.5 fL (ref 80.0–100.0)
Monocytes Absolute: 0.4 10*3/uL (ref 0.1–1.0)
Monocytes Relative: 6 %
Neutro Abs: 4.5 10*3/uL (ref 1.7–7.7)
Neutrophils Relative %: 72 %
Platelets: 234 10*3/uL (ref 150–400)
RBC: 3.81 MIL/uL — ABNORMAL LOW (ref 3.87–5.11)
RDW: 13.2 % (ref 11.5–15.5)
WBC: 6.2 10*3/uL (ref 4.0–10.5)
nRBC: 0 % (ref 0.0–0.2)

## 2020-04-01 LAB — TSH: TSH: 3.343 u[IU]/mL (ref 0.350–4.500)

## 2020-04-01 MED ORDER — FLUDEOXYGLUCOSE F - 18 (FDG) INJECTION
10.5400 | Freq: Once | INTRAVENOUS | Status: AC | PRN
  Administered 2020-04-01: 10.54 via INTRAVENOUS

## 2020-04-03 ENCOUNTER — Inpatient Hospital Stay (HOSPITAL_COMMUNITY): Payer: PPO | Attending: Hematology | Admitting: Hematology

## 2020-04-03 ENCOUNTER — Other Ambulatory Visit: Payer: Self-pay

## 2020-04-03 VITALS — BP 133/59 | HR 71 | Temp 97.3°F | Resp 18 | Wt 177.7 lb

## 2020-04-03 DIAGNOSIS — Z923 Personal history of irradiation: Secondary | ICD-10-CM | POA: Diagnosis not present

## 2020-04-03 DIAGNOSIS — Z9221 Personal history of antineoplastic chemotherapy: Secondary | ICD-10-CM | POA: Diagnosis not present

## 2020-04-03 DIAGNOSIS — Z85818 Personal history of malignant neoplasm of other sites of lip, oral cavity, and pharynx: Secondary | ICD-10-CM | POA: Diagnosis not present

## 2020-04-03 DIAGNOSIS — E041 Nontoxic single thyroid nodule: Secondary | ICD-10-CM | POA: Insufficient documentation

## 2020-04-03 DIAGNOSIS — C099 Malignant neoplasm of tonsil, unspecified: Secondary | ICD-10-CM

## 2020-04-03 NOTE — Patient Instructions (Signed)
[place] [facility] Discharge Instructions  You were seen today by Dr. Delton Coombes. He went over your recent results and scans. You will be scheduled for an ultrasound of your thyroid to check on the nodule. Drink at least 2-3 liters of water daily to keep your mouth hydrated. Dr. Delton Coombes will call you back after the ultrasound for follow up.   Thank you for choosing [place] at [facility] to provide your oncology and hematology care.  To afford each patient quality time with our provider, please arrive at least 15 minutes before your scheduled appointment time.   If you have a lab appointment with the [facility] please come in thru the Main Entrance and check in at the main information desk  You need to re-schedule your appointment should you arrive 10 or more minutes late.  We strive to give you quality time with our providers, and arriving late affects you and other patients whose appointments are after yours.  Also, if you no show three or more times for appointments you may be dismissed from the clinic at the providers discretion.     Again, thank you for choosing [facility].  Our hope is that these requests will decrease the amount of time that you wait before being seen by our physicians.       _____________________________________________________________  Should you have questions after your visit to [facility], please contact our office at [phone] between the hours of 8:00 a.m. and 4:30 p.m.  Voicemails left after 4:00 p.m. will not be returned until the following business day.  For prescription refill requests, have your pharmacy contact our office and allow 72 hours.    Cancer Center Support Programs:   > Cancer Support Group  2nd Tuesday of the month 1pm-2pm, Journey Room

## 2020-04-03 NOTE — Progress Notes (Signed)
[facility] [address] [place], [place] [address]   CLINIC:  Medical Oncology/Hematology  PCP:  Elfredia Nevins, MD [address] / [place] [address] [phone]   REASON FOR VISIT:  Follow-up for left tonsillar cancer  PRIOR THERAPY:  1. Cisplatin & Aloxi x 6 cycles from 08/16/2019 to 09/20/2019 2. Chemoradiation therapy from to 10/05/2019.  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HCC)  07/26/2019 Initial Diagnosis   Squamous cell carcinoma of left tonsil (HCC)   07/26/2019 Cancer Staging   Staging form: Pharynx - HPV-Mediated Oropharynx, AJCC 8th Edition - Clinical stage from 07/26/2019: Stage I (cT2, cN1, cM0, p16+) - Signed by Doreatha Massed, MD on 07/26/2019   08/16/2019 -  Chemotherapy   The patient had palonosetron (ALOXI) injection 0.25 mg, 0.25 mg, Intravenous,  Once, 6 of 7 cycles Administration: 0.25 mg (08/16/2019), 0.25 mg (08/23/2019), 0.25 mg (08/30/2019), 0.25 mg (09/06/2019), 0.25 mg (09/13/2019), 0.25 mg (09/20/2019) CISplatin (PLATINOL) 84 mg in sodium chloride 0.9 % 250 mL chemo infusion, 40 mg/m2 = 84 mg, Intravenous,  Once, 6 of 7 cycles Administration: 84 mg (08/16/2019), 84 mg (08/23/2019), 84 mg (08/30/2019), 84 mg (09/06/2019), 84 mg (09/13/2019), 84 mg (09/20/2019) fosaprepitant (EMEND) 150 mg in sodium chloride 0.9 % 145 mL IVPB, 150 mg, Intravenous,  Once, 6 of 7 cycles Administration: 150 mg (08/16/2019), 150 mg (08/23/2019), 150 mg (08/30/2019), 150 mg (09/06/2019), 150 mg (09/13/2019), 150 mg (09/20/2019)  for chemotherapy treatment.      CANCER STAGING: Cancer Staging Primary tonsillar squamous cell carcinoma (HCC) Staging form: Pharynx - HPV-Mediated Oropharynx, AJCC 8th Edition - Clinical stage from 07/26/2019: Stage I (cT2, cN1, cM0, p16+) - Signed by Doreatha Massed, MD on 07/26/2019   INTERVAL HISTORY:  [name], a 75 y.o. female, returns for routine follow-up of her left tonsillar cancer. Seniqua was last seen on 12/28/2019.   Today she reports feeling well. Her trouble swallowing and dry mouth are slowly improving, though bread is still difficult to swallow, though her mouth is still sensitive when eating acidic foods. She admits to not drinking enough water throughout the day. She continues having tinnitus in her left ear after the tube tympanostomy which worsened after finishing radiation.  She will see Dr. Suszanne Conners in April.  REVIEW OF SYSTEMS:  Review of Systems  Constitutional: Positive for appetite change (50%) and fatigue (50%).  HENT:   Positive for trouble swallowing (& dry mouth improving).   Neurological: Positive for headaches.  All other systems reviewed and are negative.   PAST MEDICAL/SURGICAL HISTORY:  Past Medical History:  Diagnosis Date  . Anxiety about health 08/23/2019  . Arthritis   . GERD (gastroesophageal reflux disease)   . Hypothyroidism   . Hypothyroidism (acquired) 08/23/2019  . 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 Port-A-Cath in place 08/09/2019  . 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 Scoliosis   . Sinusitis   . 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   Past Surgical History:  Procedure Laterality Date  . NO PAST SURGERIES    . No prior surgery    . PORTACATH PLACEMENT Right 08/01/2019   Procedure: INSERTION PORT-A-CATH;  Surgeon: Franky Macho, MD;  Location: [facility];  Service: General;  Laterality: Right;    SOCIAL HISTORY:  Social History   Socioeconomic History  . Marital status: Widowed    Spouse name: Not on file  . Number of children: 1  . Years of education: Not on file  . Highest education level: Not on file  Occupational History  . Occupation: RETIRED   Tobacco Use  . Smoking status: Former Research scientist (life sciences)  . Smokeless tobacco: Never Used  . Tobacco comment: some in her 30s and 30s  Vaping Use  . Vaping Use: Never used  Substance and Sexual Activity  . Alcohol use: Yes    Comment: on rare occasion "like twice a year"  . Drug use: Never  . Sexual activity: Not Currently  Other Topics Concern  . Not on file  Social History Narrative   Lives at home alone   Retired   Widow   Caffeine: about 4 cups coffee, 3 glasses of tea daily   Social Determinants of Health   Financial Resource Strain: Low Risk   . Difficulty of Paying Living Expenses: Not hard at all  Food Insecurity: No Food Insecurity  . Worried About Charity fundraiser in the Last Year: Never true  . Ran Out of Food in the Last Year: Never true  Transportation Needs: No Transportation Needs  . Lack of Transportation (Medical): No  . Lack of Transportation (Non-Medical): No  Physical Activity: Sufficiently Active  . Days of Exercise per Week: 5 days  . Minutes of Exercise per Session: 30 min  Stress: Stress Concern Present  . Feeling of Stress : Very much  Social Connections: Not on file  Intimate Partner Violence: Not At Risk  . Fear of Current or Ex-Partner: No  . Emotionally Abused: No  . Physically Abused: No  . Sexually Abused: No    FAMILY HISTORY:  Family History  Problem Relation Age of Onset  . Lung cancer Mother        smoker  . Cancer Mother        Throat  . Lung cancer Father        smoker  . Diabetes Father   . CVA Brother   . Diabetes Brother   . Diabetes Maternal Grandmother   . Heart attack Paternal Grandfather   . Migraines Neg Hx   . Headache Neg Hx     CURRENT MEDICATIONS:  Current Outpatient Medications  Medication Sig Dispense Refill  . esomeprazole (NEXIUM) 40 MG capsule Take 40 mg by mouth daily.     Marland Kitchen levothyroxine (SYNTHROID) 50 MCG tablet Take 50 mcg by mouth daily. BRAND    . PREVIDENT 5000 BOOSTER PLUS 1.1 % PSTE APPLY WITH  TOOTHBRUSH AT BEDTIME    . prochlorperazine (COMPAZINE) 10 MG tablet Take 1 tablet (10 mg total) by mouth every 6 (six) hours as needed for nausea or vomiting. 60 tablet 2  . ibuprofen (ADVIL) 200 MG tablet Take 400-600 mg by mouth every 8 (eight) hours as needed for moderate pain.  (Patient not taking: Reported on 04/03/2020)    . lidocaine-prilocaine (EMLA) cream Apply a small amount to port a cath site and cover with plastic wrap 1 hour prior to chemotherapy appointments (Patient not taking: Reported on 04/03/2020) 30 g 3   No current facility-administered medications for this visit.    ALLERGIES:  Allergies  Allergen Reactions  . Sulfamethoxazole-Trimethoprim Other (See Comments)    Headaches  . Penicillins Rash    20 years  PHYSICAL EXAM:  Performance status (ECOG): 1 - Symptomatic but completely ambulatory  Vitals:   04/03/20 1533  BP: (!) 133/59  Pulse: 71  Resp: 18  Temp: (!) 97.3 F (36.3 C)  SpO2: 97%   Wt Readings from Last 3 Encounters:  04/03/20 177 lb 11.2 oz (80.6 kg)  12/28/19 184 lb (83.5 kg)  11/22/19 180 lb 12.8 oz (82 kg)   Physical Exam Vitals reviewed.  Constitutional:      Appearance: Normal appearance.  HENT:     Mouth/Throat:     Lips: No lesions.     Mouth: No oral lesions.     Dentition: No gum lesions.     Tongue: No lesions.     Palate: No mass.     Pharynx: No posterior oropharyngeal erythema.     Tonsils: No tonsillar exudate.  Cardiovascular:     Rate and Rhythm: Normal rate and regular rhythm.     Pulses: Normal pulses.     Heart sounds: Normal heart sounds.  Pulmonary:     Effort: Pulmonary effort is normal.     Breath sounds: Normal breath sounds.  Chest:  Breasts:     Right: No supraclavicular adenopathy.     Left: No supraclavicular adenopathy.    Musculoskeletal:     Right lower leg: No edema.     Left lower leg: No edema.  Lymphadenopathy:     Cervical: No cervical adenopathy.     Right cervical: No superficial or  posterior cervical adenopathy.    Left cervical: No superficial or posterior cervical adenopathy.     Upper Body:     Right upper body: No supraclavicular adenopathy.     Left upper body: No supraclavicular adenopathy.  Neurological:     General: No focal deficit present.     Mental Status: She is alert and oriented to person, place, and time.  Psychiatric:        Mood and Affect: Mood normal.        Behavior: Behavior normal.      LABORATORY DATA:  I have reviewed the labs as listed.  CBC Latest Ref Rng & Units 04/01/2020 12/18/2019 11/20/2019  WBC 4.0 - 10.5 K/uL 6.2 5.6 7.9  Hemoglobin 12.0 - 15.0 g/dL 11.7(L) 10.8(L) 10.1(L)  Hematocrit 36.0 - 46.0 % 36.0 32.3(L) 31.3(L)  Platelets 150 - 400 K/uL 234 255 243   CMP Latest Ref Rng & Units 04/01/2020 12/18/2019 11/20/2019  Glucose 70 - 99 mg/dL 94 110(H) 114(H)  BUN 8 - 23 mg/dL 19 23 22   Creatinine 0.44 - 1.00 mg/dL 1.09(H) 1.14(H) 1.21(H)  Sodium 135 - 145 mmol/L 137 135 137  Potassium 3.5 - 5.1 mmol/L 4.1 3.9 4.0  Chloride 98 - 111 mmol/L 101 97(L) 99  CO2 22 - 32 mmol/L 29 28 28   Calcium 8.9 - 10.3 mg/dL 9.3 9.2 9.2  Total Protein 6.5 - 8.1 g/dL 7.2 7.2 7.2  Total Bilirubin 0.3 - 1.2 mg/dL 0.7 0.8 0.7  Alkaline Phos 38 - 126 U/L 67 78 73  AST 15 - 41 U/L 15 18 18   ALT 0 - 44 U/L 14 13 15     DIAGNOSTIC IMAGING:  I have independently reviewed the scans and discussed with the patient. NM PET Image Restag (PS) Skull Base To Thigh  Result Date: 04/02/2020 CLINICAL DATA:  Subsequent treatment strategy for left pharyngeal tonsillar squamous cell carcinoma status post chemotherapy and radiation therapy. EXAM: NUCLEAR MEDICINE PET SKULL BASE TO THIGH TECHNIQUE: 10.5 mCi  F-18 FDG was injected intravenously. Full-ring PET imaging was performed from the skull base to thigh after the radiotracer. CT data was obtained and used for attenuation correction and anatomic localization. Fasting blood glucose: 86 mg/dl COMPARISON:  12/25/2019  PET-CT. FINDINGS: Mediastinal blood pool activity: SUV max 3.5 Liver activity: SUV max NA NECK: No new or residual hypermetabolic lymph nodes in the neck. Previously visualized small hypermetabolic right level 2 neck lymph nodes have resolved. No recurrent focal hypermetabolism in the pharyngeal tonsils. Curvilinear muscular uptake throughout the left oropharynx and left paraspinal neck muscles, compatible with post treatment change or activity related uptake. Hypermetabolic posterosuperior right thyroid 0.9 cm nodule with max SUV 6.1 (series 3/image 96), previously 0.9 cm with max SUV 5.1, not substantially changed in size or metabolism. Incidental CT findings: none CHEST: No enlarged or hypermetabolic axillary, mediastinal or hilar lymph nodes. No hypermetabolic pulmonary findings. Incidental CT findings: Right subclavian Port-A-Cath terminates in the lower third of the SVC. Three-vessel coronary atherosclerosis. Atherosclerotic nonaneurysmal thoracic aorta. No acute consolidative airspace disease or significant pulmonary nodules. ABDOMEN/PELVIS: No abnormal hypermetabolic activity within the liver, pancreas, adrenal glands, or spleen. No hypermetabolic lymph nodes in the abdomen or pelvis. Incidental CT findings: Moderate sigmoid diverticulosis. Stable mildly enlarged myomatous uterus with coarsely calcified degenerated central uterine fibroid. Atherosclerotic nonaneurysmal abdominal aorta. SKELETON: No focal hypermetabolic activity to suggest skeletal metastasis. Incidental CT findings: Severe S-shaped thoracolumbar scoliosis. IMPRESSION: 1. No hypermetabolic metastatic disease. Previously visualized small hypermetabolic right level 2 neck lymph nodes have resolved. No recurrent focal tonsillar hypermetabolism. 2. Stable hypermetabolic 0.9 cm right thyroid nodule. Recommend thyroid US and biopsy (ref: J Am Coll Radiol. 2015 Feb;12(2): 143-50). 3. Chronic findings include: Aortic Atherosclerosis (ICD10-I70.0).  Three-vessel coronary atherosclerosis. Moderate sigmoid diverticulosis. Electronically Signed   By: Ilona Sorrel M.D.   On: 04/02/2020 09:26     ASSESSMENT:  1.Squamous cell carcinoma, p16 positiveof the left tonsil: - 06/29/2019 Biopsy A. LYMPH NODE, LEFT NECK, NEEDLE CORE BIOPSY:  - Squamous cell carcinoma, p16 positive.  COMMENT:  The carcinoma is positive with p16, p40, cytokeratin 5/6 and p63 consistent with squamous cell carcinoma. The carcinoma is negative with Epstein-Barr virus (EBV), TTF-1 and Napsin-A. -PET scan on 07/18/2019 showed left level 2A lymph node, 1 cm. Small rounded lymph node 8 mm at level 2B on the left side. Intense palatine tonsillar uptake with asymmetric fullness of tonsillar tissue and symmetrical FDG activity with masslike changes on the left side measuring 2.2 x 1.7 cm compared to the right. SUV 16.2. Small focus of increased activity in the right neck adjacent to or within the small nodule in the right hemithyroid. -An ultrasound of the thyroid gland showed heterogeneous thyroid with no masses. -She was evaluated by Dr.Yanagihara. -XRT with weekly cisplatin started on 08/16/2019.Week 6 of cisplatin on 09/20/2019. -XRT completed on 10/05/2019. -PET CT scan on 12/25/2019 showed asymmetric hypermetabolic activity in the right lingual tonsil, similar to pretreatment PET scan.  Hypermetabolic right level 2 lymph node measures 5 mm with SUV 5.5.  A second lymph node measures 5 mm with SUV 4.3.  Complete resolution of the left tonsil, and left level 2 lymph nodes. -PET scan on 04/01/2020 with no hypermetabolic metastatic disease.  Previously seen small hypermetabolic right level 2 neck lymph nodes have resolved.  No tonsillar hypermetabolism.   PLAN:  1. 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  No  oropharyngeal masses. -PET scan on 04/01/2020  showed no evidence of recurrence or metastatic disease. -However stable hypermetabolic 0.9 cm right thyroid nodule was seen. -Recommend thyroid ultrasound and follow-up by phone visit. -Continue follow-up with Dr. Benjamine Mola in April.  She is having decreased hearing in the left ear and had a tube placed by him.  2. Hypothyroidism: -Continue Synthroid daily.  TSH is 3.3.  3. Difficulty swallowing: -She has difficulty eating some foods like breads.  However she is maintaining her weight.  4. Weight loss: -Her weight has been stable since last visit.   Orders placed this encounter:  No orders of the defined types were placed in this encounter.    Derek Jack, MD [place] [phone]   I, Milinda Antis, am acting as a scribe for Dr. Sanda Linger.  I, Derek Jack MD, have reviewed the above documentation for accuracy and completeness, and I agree with the above.

## 2020-04-04 ENCOUNTER — Encounter: Payer: Self-pay | Admitting: Physician Assistant

## 2020-04-04 ENCOUNTER — Ambulatory Visit: Payer: PPO | Admitting: Physician Assistant

## 2020-04-04 DIAGNOSIS — L821 Other seborrheic keratosis: Secondary | ICD-10-CM

## 2020-04-04 DIAGNOSIS — D2262 Melanocytic nevi of left upper limb, including shoulder: Secondary | ICD-10-CM | POA: Diagnosis not present

## 2020-04-04 DIAGNOSIS — D1800 Hemangioma unspecified site: Secondary | ICD-10-CM

## 2020-04-04 DIAGNOSIS — Z85828 Personal history of other malignant neoplasm of skin: Secondary | ICD-10-CM

## 2020-04-04 DIAGNOSIS — Z86018 Personal history of other benign neoplasm: Secondary | ICD-10-CM | POA: Diagnosis not present

## 2020-04-04 DIAGNOSIS — Z87898 Personal history of other specified conditions: Secondary | ICD-10-CM

## 2020-04-04 DIAGNOSIS — Z1283 Encounter for screening for malignant neoplasm of skin: Secondary | ICD-10-CM | POA: Diagnosis not present

## 2020-04-04 DIAGNOSIS — D485 Neoplasm of uncertain behavior of skin: Secondary | ICD-10-CM

## 2020-04-04 DIAGNOSIS — Z8589 Personal history of malignant neoplasm of other organs and systems: Secondary | ICD-10-CM

## 2020-04-05 ENCOUNTER — Telehealth (HOSPITAL_COMMUNITY): Payer: Self-pay

## 2020-04-05 ENCOUNTER — Telehealth: Payer: Self-pay | Admitting: Internal Medicine

## 2020-04-05 ENCOUNTER — Encounter (HOSPITAL_COMMUNITY): Payer: PPO

## 2020-04-05 NOTE — Telephone Encounter (Signed)
Nutrition  Unable to reach patient for scheduled nutrition phone follow-up visit today. Left message with call back number.  Adore Kithcart B. Zenia Resides, [place], [place] Registered Dietitian [phone] (mobile)

## 2020-04-05 NOTE — Telephone Encounter (Signed)
Pt states she got the Moderna vaccine in Jan/Feb and got the booster  Sept '21

## 2020-04-10 ENCOUNTER — Other Ambulatory Visit: Payer: Self-pay

## 2020-04-10 ENCOUNTER — Ambulatory Visit (HOSPITAL_COMMUNITY)
Admission: RE | Admit: 2020-04-10 | Discharge: 2020-04-10 | Disposition: A | Discharge status: Home / Self Care | Payer: PPO | Source: Ambulatory Visit | Attending: Hematology | Admitting: Hematology

## 2020-04-10 DIAGNOSIS — E041 Nontoxic single thyroid nodule: Secondary | ICD-10-CM | POA: Diagnosis not present

## 2020-04-10 DIAGNOSIS — C099 Malignant neoplasm of tonsil, unspecified: Secondary | ICD-10-CM | POA: Diagnosis not present

## 2020-04-11 ENCOUNTER — Inpatient Hospital Stay (HOSPITAL_BASED_OUTPATIENT_CLINIC_OR_DEPARTMENT_OTHER): Payer: PPO | Admitting: Hematology

## 2020-04-11 ENCOUNTER — Telehealth: Payer: Self-pay | Admitting: Physician Assistant

## 2020-04-11 DIAGNOSIS — Z85818 Personal history of malignant neoplasm of other sites of lip, oral cavity, and pharynx: Secondary | ICD-10-CM | POA: Diagnosis not present

## 2020-04-11 DIAGNOSIS — Z9221 Personal history of antineoplastic chemotherapy: Secondary | ICD-10-CM

## 2020-04-11 DIAGNOSIS — E041 Nontoxic single thyroid nodule: Secondary | ICD-10-CM

## 2020-04-11 DIAGNOSIS — Z923 Personal history of irradiation: Secondary | ICD-10-CM

## 2020-04-11 NOTE — Progress Notes (Signed)
Virtual Visit via Telephone Note  I connected with Kristi Weiss on 04/11/20 at  4:15 PM EST by telephone and verified that I am speaking with the correct person using two identifiers.  Location: Patient: At home Provider: In the office   I discussed the limitations, risks, security and privacy concerns of performing an evaluation and management service by telephone and the availability of in person appointments. I also discussed with the patient that there may be a patient responsible charge related to this service. The patient expressed understanding and agreed to proceed.   History of Present Illness: She is seen in our office for follow-up of left tonsillar squamous cell carcinoma which was treated with chemoradiation therapy completed on 10/05/2019. Recent PET scan on 04/01/2020 showed no evidence of recurrence of metastatic disease.  However hypermetabolic 0.9 cm right thyroid nodule was seen.   Observations/Objective: She reports that she is continuing to have decreased hearing in the left ear.  No trouble swallowing except some foods like breads.  Assessment and Plan:  1.  Thyroid nodules: -We discussed results of ultrasound of the thyroid from 04/10/2020. -Right inferior thyroid nodule slightly decreased in size, however most suspicious for malignancy. -Interval enlargement of previously seen left inferior solid thyroid nodule, measuring 2.2 cm, previously 1 cm, biopsy recommended. -Slight interval enlargement of right mid thyroid nodule, measuring 1.2 cm, needs 1 year ultrasound surveillance. -Will arrange for biopsy of the right and left thyroid nodules.   Follow Up Instructions: RTC 6 weeks for follow-up to discuss results of the biopsy.   I discussed the assessment and treatment plan with the patient. The patient was provided an opportunity to ask questions and all were answered. The patient agreed with the plan and demonstrated an understanding of the instructions.   The  patient was advised to call back or seek an in-person evaluation if the symptoms worsen or if the condition fails to improve as anticipated.  I provided 11 minutes of non-face-to-face time during this encounter.   Derek Jack, MD

## 2020-04-11 NOTE — Telephone Encounter (Signed)
Called patient to let her know that pathology results aren't back yet and to call us next week.

## 2020-04-11 NOTE — Telephone Encounter (Signed)
Patient left message on office voice mail that she was calling for pathology results from last visit with Kelli Sheffield, PA-C. 

## 2020-04-12 ENCOUNTER — Inpatient Hospital Stay (HOSPITAL_COMMUNITY): Payer: PPO

## 2020-04-12 ENCOUNTER — Other Ambulatory Visit: Payer: Self-pay

## 2020-04-12 ENCOUNTER — Other Ambulatory Visit (HOSPITAL_COMMUNITY): Payer: Self-pay

## 2020-04-12 DIAGNOSIS — E041 Nontoxic single thyroid nodule: Secondary | ICD-10-CM | POA: Diagnosis not present

## 2020-04-12 DIAGNOSIS — C760 Malignant neoplasm of head, face and neck: Secondary | ICD-10-CM

## 2020-04-12 MED ORDER — SODIUM CHLORIDE 0.9% FLUSH
10.0000 mL | INTRAVENOUS | Status: DC | PRN
  Administered 2020-04-12: 10 mL via INTRAVENOUS

## 2020-04-12 MED ORDER — HEPARIN SOD (PORK) LOCK FLUSH 100 UNIT/ML IV SOLN
500.0000 [IU] | Freq: Once | INTRAVENOUS | Status: AC
  Administered 2020-04-12: 500 [IU] via INTRAVENOUS

## 2020-04-12 NOTE — Progress Notes (Signed)
Patient presented today for port flush.  Port to right chest accessed, blood return noted, flushing patently.  Patient tolerated well.  Discharged ambulatory and in stable condition.

## 2020-04-16 ENCOUNTER — Encounter: Payer: Self-pay | Admitting: *Deleted

## 2020-04-16 ENCOUNTER — Telehealth: Payer: Self-pay

## 2020-04-16 ENCOUNTER — Telehealth (HOSPITAL_COMMUNITY): Payer: Self-pay

## 2020-04-16 ENCOUNTER — Telehealth: Payer: Self-pay | Admitting: *Deleted

## 2020-04-16 NOTE — Telephone Encounter (Signed)
Pathology to patient-surgery appointment scheduled.  

## 2020-04-16 NOTE — Telephone Encounter (Signed)
-----   Message from Warren Danes, [place] sent at 04/16/2020 11:09 AM EST ----- ws

## 2020-04-16 NOTE — Telephone Encounter (Signed)
LEFT MESSAGE AT THE PATIENT HOME NUMBER

## 2020-04-22 ENCOUNTER — Encounter: Payer: Self-pay | Admitting: Physician Assistant

## 2020-04-22 NOTE — Progress Notes (Signed)
   Follow-Up Visit   Subjective  Kristi Weiss is a 75 y.o. female who presents for the following: Annual Exam.   The following portions of the chart were reviewed this encounter and updated as appropriate:      Objective  Well appearing patient in no apparent distress; mood and affect are within normal limits.  A full examination was performed including scalp, head, eyes, ears, nose, lips, neck, chest, axillae, abdomen, back, buttocks, bilateral upper extremities, bilateral lower extremities, hands, feet, fingers, toes, fingernails, and toenails. All findings within normal limits unless otherwise noted below.  Objective  head to toe: Full body skin examination  Objective  Mid Back: White scar- clear  Objective  Head - Anterior (Face): White scar- clear  Objective  Left Popliteal Fossa: Multiple red raised papule  Objective  Left Abdomen (side) - Upper: Multiple Stuck-on, waxy, tan-brown papules and plaques. --Discussed benign etiology and prognosis.   Objective  Left Upper Arm: Dark pigmented macule      Assessment & Plan  Screening exam for skin cancer head to toe  History of atypical nevus Mid Back  Yearly skin check  History of squamous cell carcinoma Head - Anterior (Face)  Yearly skin check  Hemangioma, unspecified site Left Popliteal Fossa  Okay to leave if stable  Seborrheic keratosis Left Abdomen (side) - Upper  Okay to leave if stable  Neoplasm of uncertain behavior of skin Left Upper Arm  Skin / nail biopsy Type of biopsy: tangential   Informed consent: discussed and consent obtained   Timeout: patient name, date of birth, surgical site, and procedure verified   Procedure prep:  Patient was prepped and draped in usual sterile fashion (Non sterile) Prep type:  Chlorhexidine Anesthesia: the lesion was anesthetized in a standard fashion   Anesthetic:  1% lidocaine w/ epinephrine 1-100,000 local infiltration Instrument used:  flexible razor blade   Outcome: patient tolerated procedure well   Post-procedure details: wound care instructions given    Specimen 1 - Surgical pathology Differential Diagnosis: r/o atypia  Check Margins: No    I, Jahlisa Rossitto, PA-C, have reviewed all documentation's for this visit.  The documentation on 04/22/20 for the exam, diagnosis, procedures and orders are all accurate and complete.

## 2020-04-29 ENCOUNTER — Telehealth: Payer: Self-pay | Admitting: Physician Assistant

## 2020-04-29 NOTE — Telephone Encounter (Signed)
Wants information about the biopsy and what needs to be done

## 2020-05-08 ENCOUNTER — Encounter (HOSPITAL_COMMUNITY): Payer: Self-pay

## 2020-05-08 ENCOUNTER — Ambulatory Visit (HOSPITAL_COMMUNITY)
Admission: RE | Admit: 2020-05-08 | Discharge: 2020-05-08 | Disposition: A | Discharge status: Home / Self Care | Payer: PPO | Source: Ambulatory Visit | Attending: Hematology | Admitting: Hematology

## 2020-05-08 ENCOUNTER — Other Ambulatory Visit: Payer: Self-pay

## 2020-05-08 ENCOUNTER — Other Ambulatory Visit (HOSPITAL_COMMUNITY): Payer: Self-pay | Admitting: Hematology

## 2020-05-08 DIAGNOSIS — C760 Malignant neoplasm of head, face and neck: Secondary | ICD-10-CM | POA: Diagnosis not present

## 2020-05-08 DIAGNOSIS — E042 Nontoxic multinodular goiter: Secondary | ICD-10-CM | POA: Diagnosis not present

## 2020-05-08 DIAGNOSIS — E041 Nontoxic single thyroid nodule: Secondary | ICD-10-CM | POA: Diagnosis not present

## 2020-05-08 MED ORDER — LIDOCAINE HCL (PF) 2 % IJ SOLN
INTRAMUSCULAR | Status: AC
  Administered 2020-05-08: 10 mL
  Filled 2020-05-08: qty 20

## 2020-05-08 NOTE — Sedation Documentation (Signed)
PT tolerated thyroid biopsy procedure well today of two areas. Labs obtained and sent for pathology/affirma. PT ambulatory at discharge with no acute distress noted and verbalized understanding of discharge instructions.

## 2020-05-08 NOTE — Discharge Instructions (Signed)
Thyroid Needle Biopsy, Care After This sheet gives you information about how to care for yourself after your procedure. Your health care provider may also give you more specific instructions. If you have problems or questions, contact your health care provider. What can I expect after the procedure? After the procedure, it is common to have:  Soreness and tenderness that lasts for a few days.  Bruising where the needle was inserted (puncture site). Follow these instructions at home:  Take over-the-counter and prescription medicines only as told by your health care provider.  To help ease discomfort, keep your head raised (elevated) when you are lying down. When you move from lying down to sitting up, use both hands to support the back of your head and neck.  Check your puncture site every day for signs of infection. Check for: ? Redness, swelling, or pain. ? Fluid or blood. ? Warmth. ? Pus or a bad smell.  Return to your normal activities as told by your health care provider. Ask your health care provider what activities are safe for you.  Keep all follow-up visits as told by your health care provider. This is important.   Contact a health care provider if:  You have redness, swelling, or pain around your puncture site.  You have fluid or blood coming from your puncture site.  Your puncture site feels warm to the touch.  You have pus or a bad smell coming from your puncture site.  You have a fever. Get help right away if:  You have severe bleeding from the puncture site.  You have difficulty swallowing.  You have swollen glands (lymph nodes) in your neck. Summary  It is common to have some bruising and soreness where the needle was inserted in your lower front neck area (puncture site).  Check your puncture site every day for signs of infection, such as redness, swelling, or pain.  Get help right away if you have severe bleeding from your puncture site. This  information is not intended to replace advice given to you by your health care provider. Make sure you discuss any questions you have with your health care provider. Document Revised: 10/26/2019 Document Reviewed: 10/26/2019 Elsevier Patient Education  2021 Elsevier Inc.  

## 2020-05-09 LAB — CYTOLOGY - NON PAP

## 2020-05-22 DIAGNOSIS — C099 Malignant neoplasm of tonsil, unspecified: Secondary | ICD-10-CM | POA: Diagnosis not present

## 2020-05-22 DIAGNOSIS — Z08 Encounter for follow-up examination after completed treatment for malignant neoplasm: Secondary | ICD-10-CM | POA: Diagnosis not present

## 2020-05-22 DIAGNOSIS — Z923 Personal history of irradiation: Secondary | ICD-10-CM | POA: Diagnosis not present

## 2020-05-22 DIAGNOSIS — Z85819 Personal history of malignant neoplasm of unspecified site of lip, oral cavity, and pharynx: Secondary | ICD-10-CM | POA: Diagnosis not present

## 2020-05-23 ENCOUNTER — Inpatient Hospital Stay (HOSPITAL_COMMUNITY): Payer: PPO | Attending: Hematology | Admitting: Hematology

## 2020-05-23 ENCOUNTER — Other Ambulatory Visit: Payer: Self-pay

## 2020-05-23 VITALS — BP 138/76 | HR 60 | Temp 97.0°F | Resp 18 | Wt 176.3 lb

## 2020-05-23 DIAGNOSIS — Z85818 Personal history of malignant neoplasm of other sites of lip, oral cavity, and pharynx: Secondary | ICD-10-CM | POA: Insufficient documentation

## 2020-05-23 DIAGNOSIS — E039 Hypothyroidism, unspecified: Secondary | ICD-10-CM | POA: Diagnosis not present

## 2020-05-23 DIAGNOSIS — Z833 Family history of diabetes mellitus: Secondary | ICD-10-CM | POA: Insufficient documentation

## 2020-05-23 DIAGNOSIS — Z9221 Personal history of antineoplastic chemotherapy: Secondary | ICD-10-CM | POA: Diagnosis not present

## 2020-05-23 DIAGNOSIS — Z8249 Family history of ischemic heart disease and other diseases of the circulatory system: Secondary | ICD-10-CM | POA: Diagnosis not present

## 2020-05-23 DIAGNOSIS — Z87891 Personal history of nicotine dependence: Secondary | ICD-10-CM | POA: Insufficient documentation

## 2020-05-23 DIAGNOSIS — Z801 Family history of malignant neoplasm of trachea, bronchus and lung: Secondary | ICD-10-CM | POA: Insufficient documentation

## 2020-05-23 DIAGNOSIS — Z79899 Other long term (current) drug therapy: Secondary | ICD-10-CM | POA: Diagnosis not present

## 2020-05-23 DIAGNOSIS — E041 Nontoxic single thyroid nodule: Secondary | ICD-10-CM | POA: Diagnosis not present

## 2020-05-23 DIAGNOSIS — C099 Malignant neoplasm of tonsil, unspecified: Secondary | ICD-10-CM | POA: Diagnosis not present

## 2020-05-23 DIAGNOSIS — R1310 Dysphagia, unspecified: Secondary | ICD-10-CM | POA: Diagnosis not present

## 2020-05-23 MED ORDER — LEVOTHYROXINE SODIUM 50 MCG PO TABS: 50.0000 ug | ORAL_TABLET | Freq: Every day | ORAL | 2 refills | Status: DC

## 2020-05-23 MED ORDER — ESOMEPRAZOLE MAGNESIUM 40 MG PO CPDR: 40.0000 mg | DELAYED_RELEASE_CAPSULE | Freq: Every day | ORAL | 2 refills | Status: DC

## 2020-05-23 NOTE — Progress Notes (Signed)
[address] [place], [place] [address]   CLINIC:  Medical Oncology/Hematology  PCP:  [facility], [place] / [place] [address] [phone]   REASON FOR VISIT:  Follow-up for left tonsillar cancer  PRIOR THERAPY:  1. Cisplatin & Aloxi x 6 cycles from 08/16/2019 to 09/20/2019 2. Chemoradiation therapy from to 10/05/2019.  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[place])   07/26/2019 Cancer Staging   Staging form: Pharynx - HPV-Mediated Oropharynx, AJCC 8th Edition - Clinical stage from 07/26/2019: Stage I (cT2, cN1, cM0, p16+) - Signed by Derek Jack, MD on 07/26/2019   08/16/2019 -  Chemotherapy   The patient had palonosetron (ALOXI) injection 0.25 mg, 0.25 mg, Intravenous,  Once, 6 of 7 cycles Administration: 0.25 mg (08/16/2019), 0.25 mg (08/23/2019), 0.25 mg (08/30/2019), 0.25 mg (09/06/2019), 0.25 mg (09/13/2019), 0.25 mg (09/20/2019) CISplatin (PLATINOL) 84 mg in sodium chloride 0.9 % 250 mL chemo infusion, 40 mg/m2 = 84 mg, Intravenous,  Once, 6 of 7 cycles Administration: 84 mg (08/16/2019), 84 mg (08/23/2019), 84 mg (08/30/2019), 84 mg (09/06/2019), 84 mg (09/13/2019), 84 mg (09/20/2019) fosaprepitant (EMEND) 150 mg in sodium chloride 0.9 % 145 mL IVPB, 150 mg, Intravenous,  Once, 6 of 7 cycles Administration: 150 mg (08/16/2019), 150 mg (08/23/2019), 150 mg (08/30/2019), 150 mg (09/06/2019), 150 mg (09/13/2019), 150 mg (09/20/2019)  for chemotherapy treatment.      CANCER STAGING: Cancer Staging Primary tonsillar squamous cell carcinoma ([facility]) Staging form: Pharynx - HPV-Mediated Oropharynx, AJCC 8th Edition - Clinical stage from 07/26/2019: Stage I (cT2, cN1, cM0, p16+) - Signed by Derek Jack, MD on 07/26/2019   INTERVAL HISTORY:  [name], a 75 y.o. female, returns for routine follow-up of her left tonsillar cancer. Hibo was last contacted via telephone on 04/11/2020.   Today she reports feeling well. She saw Dr. Adella Nissen on 03/23 and will follow-up with him in 3 months. She complains of having irritation in the back of her throat and tongue for the past 1 week, in particular worsened by vinegar and also mild burning sensation in her epigastric region when she eats and even when she does not eat. She is not drinking anymore Ensure. She is taking Nexium 40 mg daily. Her appetite is excellent and she is eating 3 meals per day, though her taste has not completely returned. She admits to not drinking enough water, though she drinks tea multiple times per day. She had occasional numbness in her hands after she uses them due to her carpal tunnel.  She continues having issues with her left ear, which started with tinnitus and now is muffled, and will see Dr. Benjamine Mola in April for follow-up.   REVIEW OF SYSTEMS:  Review of Systems  Constitutional: Negative for appetite change and fatigue.  HENT:   Positive for sore throat (back of throat irritated) and tinnitus (& muffled hearing in L ear).   Gastrointestinal: Positive for abdominal pain.  Neurological: Positive for headaches (occasional) and numbness (hands d/t carpal tunnel).  All other systems reviewed and are negative.   PAST MEDICAL/SURGICAL HISTORY:  Past Medical History:  Diagnosis Date   Anxiety about health 08/23/2019   Arthritis    Atypical mole 06/30/2001   features of dys nevus- upper left back    Atypical mole 04/04/2020  mod-left upper arm -   GERD (gastroesophageal reflux disease)    Hypothyroidism    Hypothyroidism (acquired) 08/23/2019   Melanocytic nevus with features of Dysplastic Nevus 06/30/2001   Upper Left Back   Port-A-Cath in place 08/09/2019   SCC (squamous cell carcinoma) 04/12/2012   Left Forehead (Cx3,5FU)   SCC (squamous cell carcinoma)  03/16/2018   left upper cheek cx3 52fu   Scoliosis    Sinusitis    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   Past Surgical History:  Procedure Laterality Date   NO PAST SURGERIES     No prior surgery     PORTACATH PLACEMENT Right 08/01/2019   Procedure: INSERTION PORT-A-CATH;  Surgeon: Aviva Signs, MD;  Location: [facility];  Service: General;  Laterality: Right;    SOCIAL HISTORY:  Social History   Socioeconomic History   Marital status: Widowed    Spouse name: Not on file   Number of children: 1   Years of education: Not on file   Highest education level: Not on file  Occupational History   Occupation: RETIRED  Tobacco Use   Smoking status: Former Smoker   Smokeless tobacco: Never Used   Tobacco comment: some in her 67s and 6s  Vaping Use   Vaping Use: Never used  Substance and Sexual Activity   Alcohol use: Yes    Comment: on rare occasion "like twice a year"   Drug use: Never   Sexual activity: Not Currently  Other Topics Concern   Not on file  Social History Narrative   Lives at home alone   Retired   Widow   Caffeine: about 4 cups coffee, 3 glasses of tea daily   Social Determinants of Radio broadcast assistant Strain: Low Risk    Difficulty of Paying Living Expenses: Not hard at all  Food Insecurity: No Food Insecurity   Worried About Charity fundraiser in the Last Year: Never true   Arboriculturist in the Last Year: Never true  Transportation Needs: No Transportation Needs   Lack of Transportation (Medical): No   Lack of Transportation (Non-Medical): No  Physical Activity: Sufficiently Active   Days of Exercise per Week: 5 days   Minutes of Exercise per Session: 30 min  Stress: Stress Concern Present   Feeling of Stress : Very much  Social Connections: Not on file  Intimate Partner Violence: Not At Risk   Fear of  Current or Ex-Partner: No   Emotionally Abused: No   Physically Abused: No   Sexually Abused: No    FAMILY HISTORY:  Family History  Problem Relation Age of Onset   Lung cancer Mother        smoker   Cancer Mother        Throat   Lung cancer Father        smoker   Diabetes Father    CVA Brother    Diabetes Brother    Diabetes Maternal Grandmother    Heart attack Paternal Grandfather    Migraines Neg Hx    Headache Neg Hx     CURRENT MEDICATIONS:  Current Outpatient Medications  Medication Sig Dispense Refill   ibuprofen (ADVIL) 200 MG tablet Take 400-600 mg by mouth every 8 (eight) hours as needed for moderate pain.     PREVIDENT 5000 BOOSTER PLUS 1.1 % PSTE  APPLY WITH TOOTHBRUSH AT BEDTIME     esomeprazole (NEXIUM) 40 MG capsule Take 1 capsule (40 mg total) by mouth daily. 90 capsule 2   levothyroxine (SYNTHROID) 50 MCG tablet Take 1 tablet (50 mcg total) by mouth daily. BRAND 90 tablet 2   lidocaine-prilocaine (EMLA) cream Apply a small amount to port a cath site and cover with plastic wrap 1 hour prior to chemotherapy appointments (Patient not taking: Reported on 05/23/2020) 30 g 3   prochlorperazine (COMPAZINE) 10 MG tablet Take 1 tablet (10 mg total) by mouth every 6 (six) hours as needed for nausea or vomiting. (Patient not taking: Reported on 05/23/2020) 60 tablet 2   No current facility-administered medications for this visit.    ALLERGIES:  Allergies  Allergen Reactions   Sulfamethoxazole-Trimethoprim Other (See Comments)    Headaches   Penicillins Rash    20 years    PHYSICAL EXAM:  Performance status (ECOG): 1 - Symptomatic but completely ambulatory  Vitals:   05/23/20 1439  BP: 138/76  Pulse: 60  Resp: 18  Temp: (!) 97 F (36.1 C)  SpO2: 94%   Wt Readings from Last 3 Encounters:  05/23/20 176 lb 4.8 oz (80 kg)  04/03/20 177 lb 11.2 oz (80.6 kg)  12/28/19 184 lb (83.5 kg)   Physical Exam Vitals reviewed.  Constitutional:       Appearance: Normal appearance.  HENT:     Mouth/Throat:     Lips: No lesions.     Mouth: No oral lesions.     Dentition: No gum lesions.     Tongue: No lesions.     Palate: No mass.     Pharynx: No posterior oropharyngeal erythema.     Tonsils: No tonsillar exudate or tonsillar abscesses.  Cardiovascular:     Rate and Rhythm: Normal rate and regular rhythm.     Pulses: Normal pulses.     Heart sounds: Normal heart sounds.  Pulmonary:     Effort: Pulmonary effort is normal.     Breath sounds: Normal breath sounds.  Neurological:     General: No focal deficit present.     Mental Status: She is alert and oriented to person, place, and time.  Psychiatric:        Mood and Affect: Mood normal.        Behavior: Behavior normal.      LABORATORY DATA:  I have reviewed the labs as listed.  CBC Latest Ref Rng & Units 04/01/2020 12/18/2019 11/20/2019  WBC 4.0 - 10.5 K/uL 6.2 5.6 7.9  Hemoglobin 12.0 - 15.0 g/dL 11.7(L) 10.8(L) 10.1(L)  Hematocrit 36.0 - 46.0 % 36.0 32.3(L) 31.3(L)  Platelets 150 - 400 K/uL 234 255 243   CMP Latest Ref Rng & Units 04/01/2020 12/18/2019 11/20/2019  Glucose 70 - 99 mg/dL 94 110(H) 114(H)  BUN 8 - 23 mg/dL 19 23 22   Creatinine 0.44 - 1.00 mg/dL 1.09(H) 1.14(H) 1.21(H)  Sodium 135 - 145 mmol/L 137 135 137  Potassium 3.5 - 5.1 mmol/L 4.1 3.9 4.0  Chloride 98 - 111 mmol/L 101 97(L) 99  CO2 22 - 32 mmol/L 29 28 28   Calcium 8.9 - 10.3 mg/dL 9.3 9.2 9.2  Total Protein 6.5 - 8.1 g/dL 7.2 7.2 7.2  Total Bilirubin 0.3 - 1.2 mg/dL 0.7 0.8 0.7  Alkaline Phos 38 - 126 U/L 67 78 73  AST 15 - 41 U/L 15 18 18   ALT 0 - 44 U/L 14 13 15     DIAGNOSTIC IMAGING:  I have independently reviewed the scans and discussed with the patient. [place] FNA BX THYROID 1ST LESION AFIRMA  Result Date: 05/08/2020 INDICATION: Indeterminate thyroid nodule EXAM: ULTRASOUND GUIDED FINE NEEDLE ASPIRATION OF INDETERMINATE THYROID NODULE COMPARISON:  Thyroid ultrasound 04/10/2020.  MEDICATIONS: None COMPLICATIONS: None immediate. TECHNIQUE: Informed written consent was obtained from the patient after a discussion of the risks, benefits and alternatives to treatment. Questions regarding the procedure were encouraged and answered. A timeout was performed prior to the initiation of the procedure. Pre-procedural ultrasound scanning demonstrated unchanged size and appearance of the indeterminate nodule within the left thyroid lobe. The procedure was planned. The neck was prepped in the usual sterile fashion, and a sterile drape was applied covering the operative field. A timeout was performed prior to the initiation of the procedure. Local anesthesia was provided with 1% lidocaine. Under direct ultrasound guidance, 4 FNA biopsies were performed of the left thyroid nodule with a 25 gauge needle. Multiple ultrasound images were saved for procedural documentation purposes. The samples were prepared and submitted to pathology. Limited post procedural scanning was negative for hematoma or additional complication. Dressings were placed. The patient tolerated the above procedures procedure well without immediate postprocedural complication. FINDINGS: Nodule reference number based on prior diagnostic ultrasound: Nodule 3. Maximum size: 2.2 cm Location: Left; inferior ACR TI-RADS risk category: TR 4 Reason for biopsy: Suspicious nodule Ultrasound imaging confirms appropriate placement of the needles within the thyroid nodule. IMPRESSION: Technically successful ultrasound guided fine needle aspiration of indeterminate left thyroid nodule. Electronically Signed   By: Marcello Moores  Register   On: 05/08/2020 13:09   [place] FNA BX THYROID EA ADD LESION AFIRMA  Result Date: 05/08/2020 INDICATION: Indeterminate thyroid nodule EXAM: ULTRASOUND GUIDED FINE NEEDLE ASPIRATION OF INDETERMINATE THYROID NODULE COMPARISON:  Ultrasound 04/10/2020. MEDICATIONS: Noted. COMPLICATIONS: None immediate. TECHNIQUE: Informed written  consent was obtained from the patient after a discussion of the risks, benefits and alternatives to treatment. Questions regarding the procedure were encouraged and answered. A timeout was performed prior to the initiation of the procedure. Pre-procedural ultrasound scanning demonstrated unchanged size and appearance of the indeterminate nodule within the right thyroid lobe The procedure was planned. The neck was prepped in the usual sterile fashion, and a sterile drape was applied covering the operative field. A timeout was performed prior to the initiation of the procedure. Local anesthesia was provided with 1% lidocaine. Under direct ultrasound guidance, 4 FNA biopsies were performed of the right thyroid lobe nodule with a 25 gauge needle. Multiple ultrasound images were saved for procedural documentation purposes. The samples were prepared and submitted to pathology. Limited post procedural scanning was negative for hematoma or additional complication. Dressings were placed. The patient tolerated the above procedures procedure well without immediate postprocedural complication. FINDINGS: Nodule reference number based on prior diagnostic ultrasound: 2. Maximum size: 1.0 cm Location: Right; inferior ACR TI-RADS risk category: TR 5 Reason for biopsy: Suspicious nodule Ultrasound imaging confirms appropriate placement of the needles within the thyroid nodule. IMPRESSION: Technically successful ultrasound guided fine needle aspiration of right thyroid nodule. Electronically Signed   By: Marcello Moores  Register   On: 05/08/2020 13:25     ASSESSMENT:  1.Squamous cell carcinoma, p16 positiveof the left tonsil: - 06/29/2019 Biopsy A. LYMPH NODE, LEFT NECK, NEEDLE CORE BIOPSY:  - Squamous cell carcinoma, p16 positive.  COMMENT:  The carcinoma is positive with p16, p40, cytokeratin 5/6 and p63 consistent with squamous cell carcinoma. The carcinoma is negative with Epstein-Barr virus (EBV), TTF-1 and Napsin-A. -PET  scan on 07/18/2019 showed left level 2A lymph node, 1 cm. Small rounded lymph node 8 mm at level 2B on the left side. Intense palatine tonsillar uptake with asymmetric fullness of tonsillar tissue and symmetrical FDG activity with masslike changes on the left side measuring 2.2 x 1.7 cm compared to the right. SUV 16.2. Small focus of increased activity in the right neck adjacent to or within the small nodule in the right hemithyroid. -An ultrasound of the thyroid gland showed heterogeneous thyroid with no masses. -She was evaluated by Dr.Yanagihara. -XRT with weekly cisplatin started on 08/16/2019.Week 6 of cisplatin on 09/20/2019. -XRT completed on 10/05/2019. -PET CT scan on 12/25/2019 showed asymmetric hypermetabolic activity in the right lingual tonsil, similar to pretreatment PET scan. Hypermetabolic right level 2 lymph node measures 5 mm with SUV 5.5. A second lymph node measures 5 mm with SUV 4.3. Complete resolution of the left tonsil, and left level 2 lymph nodes. -PET scan on 04/01/2020 with no hypermetabolic metastatic disease.  Previously seen small hypermetabolic right level 2 neck lymph nodes have resolved.  No tonsillar hypermetabolism.   PLAN:  1. 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 -Vinegar-based cell dressings irritates back of throat.  She reported epigastric gnawing and burning sensation for the last 1 week.  She has been taking Nexium 40 mg for many years.  We will give a refill.  She was told to take Maalox.  She was told to avoid any foods irritating her stomach. -Physical examination today did not reveal any palpable masses. -She will follow up with Dr. Benjamine Mola for decreased hearing in the left ear. -Follow-up in 3 months with repeat labs.  Plan to repeat CT neck in 6 months.   2. Hypothyroidism: -Continue Synthroid daily.  TSH is 3.3.  We will send  refill.  3. Difficulty swallowing: -She has some difficulty eating foods like breads.  However she is maintaining her weight.  4.  Thyroid nodules: -She was found to have thyroid nodules on PET scan.  We followed it with ultrasound. -Left thyroid nodule biopsy on 05/08/2020 consistent with benign follicular nodule, Bethesda category type II.  We discussed pathology reports in detail. -Right thyroid FNA was BI-RADS Category 1. -We will continue to monitor closely.   Orders placed this encounter:  Orders Placed This Encounter  Procedures   CBC with Differential/Platelet   Comprehensive metabolic panel   TSH     Derek Jack, MD [place] [phone]   I, Milinda Antis, am acting as a scribe for Dr. Sanda Linger.  I, Derek Jack MD, have reviewed the above documentation for accuracy and completeness, and I agree with the above.

## 2020-05-23 NOTE — Patient Instructions (Signed)
[place] at [facility] Discharge Instructions  You were seen today by Dr. Delton Coombes. He went over your recent results. Try taking Maalox or Mylanta to see if your throat irritation and stomach burning improve. Keep your follow-ups with Dr. Adella Nissen and Benjamine Mola. Dr. Delton Coombes will see you back in 3 months for labs and follow up.   Thank you for choosing [place] at [facility] to provide your oncology and hematology care.  To afford each patient quality time with our provider, please arrive at least 15 minutes before your scheduled appointment time.   If you have a lab appointment with the [facility] please come in thru the Main Entrance and check in at the main information desk  You need to re-schedule your appointment should you arrive 10 or more minutes late.  We strive to give you quality time with our providers, and arriving late affects you and other patients whose appointments are after yours.  Also, if you no show three or more times for appointments you may be dismissed from the clinic at the providers discretion.     Again, thank you for choosing [facility].  Our hope is that these requests will decrease the amount of time that you wait before being seen by our physicians.       _____________________________________________________________  Should you have questions after your visit to [facility], please contact our office at [phone] between the hours of 8:00 a.m. and 4:30 p.m.  Voicemails left after 4:00 p.m. will not be returned until the following business day.  For prescription refill requests, have your pharmacy contact our office and allow 72 hours.    Cancer Center Support Programs:   > Cancer Support Group  2nd Tuesday of the month 1pm-2pm, Journey Room

## 2020-05-29 DIAGNOSIS — I10 Essential (primary) hypertension: Secondary | ICD-10-CM | POA: Diagnosis not present

## 2020-05-29 DIAGNOSIS — E7849 Other hyperlipidemia: Secondary | ICD-10-CM | POA: Diagnosis not present

## 2020-05-31 ENCOUNTER — Telehealth (HOSPITAL_COMMUNITY): Payer: Self-pay

## 2020-05-31 NOTE — Telephone Encounter (Signed)
Nutrition  Called patient for nutrition follow-up (2nd attempt).  No answer on home phone or option to leave voicemail.  No answer on mobile number as well or option to leave voicemail.   Chart reviewed. Patient has completed treatment. Noted patient seen on 3/24 and appetite was excellent with some irritation in back of throat and tongue for the past week.  Patient not drinking ensure anymore. Taste has still not returned.    Weight 176 lb 4.8 oz on 3/24  2/2 177 lb 11.2 oz 10/28 184 lb  Please refer patient back to RD if needed.  RD available as needed.  Caiden Monsivais B. Zenia Resides, [place], [place] Registered Dietitian [phone] (mobile)

## 2020-06-12 DIAGNOSIS — Z85819 Personal history of malignant neoplasm of unspecified site of lip, oral cavity, and pharynx: Secondary | ICD-10-CM | POA: Diagnosis not present

## 2020-06-12 DIAGNOSIS — H6123 Impacted cerumen, bilateral: Secondary | ICD-10-CM | POA: Diagnosis not present

## 2020-06-12 DIAGNOSIS — H9313 Tinnitus, bilateral: Secondary | ICD-10-CM | POA: Diagnosis not present

## 2020-06-12 DIAGNOSIS — H903 Sensorineural hearing loss, bilateral: Secondary | ICD-10-CM | POA: Diagnosis not present

## 2020-06-12 DIAGNOSIS — H7202 Central perforation of tympanic membrane, left ear: Secondary | ICD-10-CM | POA: Diagnosis not present

## 2020-07-09 ENCOUNTER — Encounter: Payer: PPO | Admitting: Physician Assistant

## 2020-07-12 ENCOUNTER — Encounter (HOSPITAL_COMMUNITY): Payer: PPO

## 2020-07-15 ENCOUNTER — Encounter (INDEPENDENT_AMBULATORY_CARE_PROVIDER_SITE_OTHER): Payer: Self-pay

## 2020-07-15 ENCOUNTER — Ambulatory Visit (INDEPENDENT_AMBULATORY_CARE_PROVIDER_SITE_OTHER): Payer: PPO | Admitting: Physician Assistant

## 2020-07-15 ENCOUNTER — Encounter: Payer: Self-pay | Admitting: Physician Assistant

## 2020-07-15 ENCOUNTER — Other Ambulatory Visit: Payer: Self-pay

## 2020-07-15 DIAGNOSIS — D2262 Melanocytic nevi of left upper limb, including shoulder: Secondary | ICD-10-CM

## 2020-07-15 DIAGNOSIS — L988 Other specified disorders of the skin and subcutaneous tissue: Secondary | ICD-10-CM | POA: Diagnosis not present

## 2020-07-15 DIAGNOSIS — D229 Melanocytic nevi, unspecified: Secondary | ICD-10-CM

## 2020-07-15 NOTE — Progress Notes (Signed)
   Follow-Up Visit   Subjective  Kristi Weiss is a 75 y.o. female who presents for the following: Procedure (Patient here today for wider shave on left upper arm).   The following portions of the chart were reviewed this encounter and updated as appropriate:      Objective  Well appearing patient in no apparent distress; mood and affect are within normal limits.  A focused examination was performed including left  upper arm. Relevant physical exam findings are noted in the Assessment and Plan.  Objective  Left Upper Arm: Pink scar   Assessment & Plan  Atypical mole Left Upper Arm  Epidermal / dermal shaving - Left Upper Arm  Lesion diameter (cm):  1.4 Informed consent: discussed and consent obtained   Timeout: patient name, date of birth, surgical site, and procedure verified   Procedure prep:  Patient was prepped and draped in usual sterile fashion Prep type:  Chlorhexidine Anesthesia: the lesion was anesthetized in a standard fashion   Anesthetic:  1% lidocaine w/ epinephrine 1-100,000 local infiltration Instrument used: DermaBlade   Hemostasis achieved with: aluminum chloride and electrodesiccation   Outcome: patient tolerated procedure well   Post-procedure details: sterile dressing applied and wound care instructions given   Dressing type: petrolatum and bandage    Specimen 1 - Surgical pathology Differential Diagnosis: R/O Atypia - cautery after biopsy [id number] Check Margins: Yes    I, Ashiyah Pavlak, PA-C, have reviewed all documentation's for this visit.  The documentation on 07/15/20 for the exam, diagnosis, procedures and orders are all accurate and complete.

## 2020-07-15 NOTE — Patient Instructions (Signed)

## 2020-07-30 ENCOUNTER — Telehealth: Payer: Self-pay | Admitting: Physician Assistant

## 2020-07-30 DIAGNOSIS — I10 Essential (primary) hypertension: Secondary | ICD-10-CM | POA: Diagnosis not present

## 2020-07-30 DIAGNOSIS — E7849 Other hyperlipidemia: Secondary | ICD-10-CM | POA: Diagnosis not present

## 2020-07-30 NOTE — Telephone Encounter (Signed)
Path to patient margin free

## 2020-07-30 NOTE — Telephone Encounter (Signed)
Results, KRS 

## 2020-08-21 ENCOUNTER — Inpatient Hospital Stay (HOSPITAL_COMMUNITY): Payer: PPO | Attending: Hematology

## 2020-08-21 ENCOUNTER — Other Ambulatory Visit: Payer: Self-pay

## 2020-08-21 DIAGNOSIS — Z923 Personal history of irradiation: Secondary | ICD-10-CM | POA: Insufficient documentation

## 2020-08-21 DIAGNOSIS — H9192 Unspecified hearing loss, left ear: Secondary | ICD-10-CM | POA: Diagnosis not present

## 2020-08-21 DIAGNOSIS — Z9221 Personal history of antineoplastic chemotherapy: Secondary | ICD-10-CM | POA: Insufficient documentation

## 2020-08-21 DIAGNOSIS — R1310 Dysphagia, unspecified: Secondary | ICD-10-CM | POA: Diagnosis not present

## 2020-08-21 DIAGNOSIS — I890 Lymphedema, not elsewhere classified: Secondary | ICD-10-CM | POA: Diagnosis not present

## 2020-08-21 DIAGNOSIS — Z79899 Other long term (current) drug therapy: Secondary | ICD-10-CM | POA: Insufficient documentation

## 2020-08-21 DIAGNOSIS — C099 Malignant neoplasm of tonsil, unspecified: Secondary | ICD-10-CM | POA: Insufficient documentation

## 2020-08-21 LAB — COMPREHENSIVE METABOLIC PANEL
ALT: 15 U/L (ref 0–44)
AST: 19 U/L (ref 15–41)
Albumin: 4 g/dL (ref 3.5–5.0)
Alkaline Phosphatase: 80 U/L (ref 38–126)
Anion gap: 8 (ref 5–15)
BUN: 17 mg/dL (ref 8–23)
CO2: 29 mmol/L (ref 22–32)
Calcium: 9 mg/dL (ref 8.9–10.3)
Chloride: 101 mmol/L (ref 98–111)
Creatinine, Ser: 0.92 mg/dL (ref 0.44–1.00)
GFR, Estimated: >60 mL/min (ref >60)
Glucose, Bld: 86 mg/dL (ref 70–99)
Potassium: 3.8 mmol/L (ref 3.5–5.1)
Sodium: 138 mmol/L (ref 135–145)
Total Bilirubin: 0.8 mg/dL (ref 0.3–1.2)
Total Protein: 6.9 g/dL (ref 6.5–8.1)

## 2020-08-21 LAB — CBC WITH DIFFERENTIAL/PLATELET
Abs Immature Granulocytes: 0.01 10*3/uL (ref 0.00–0.07)
Basophils Absolute: 0 10*3/uL (ref 0.0–0.1)
Basophils Relative: 1 %
Eosinophils Absolute: 0 10*3/uL (ref 0.0–0.5)
Eosinophils Relative: 1 %
HCT: 36.6 % (ref 36.0–46.0)
Hemoglobin: 11.9 g/dL — ABNORMAL LOW (ref 12.0–15.0)
Immature Granulocytes: 0 %
Lymphocytes Relative: 23 %
Lymphs Abs: 1.2 10*3/uL (ref 0.7–4.0)
MCH: 30.7 pg (ref 26.0–34.0)
MCHC: 32.5 g/dL (ref 30.0–36.0)
MCV: 94.3 fL (ref 80.0–100.0)
Monocytes Absolute: 0.4 10*3/uL (ref 0.1–1.0)
Monocytes Relative: 7 %
Neutro Abs: 3.7 10*3/uL (ref 1.7–7.7)
Neutrophils Relative %: 68 %
Platelets: 228 10*3/uL (ref 150–400)
RBC: 3.88 MIL/uL (ref 3.87–5.11)
RDW: 13.3 % (ref 11.5–15.5)
WBC: 5.4 10*3/uL (ref 4.0–10.5)
nRBC: 0 % (ref 0.0–0.2)

## 2020-08-21 LAB — TSH: TSH: 2.885 u[IU]/mL (ref 0.350–4.500)

## 2020-08-22 DIAGNOSIS — C099 Malignant neoplasm of tonsil, unspecified: Secondary | ICD-10-CM | POA: Diagnosis not present

## 2020-08-28 ENCOUNTER — Encounter (HOSPITAL_COMMUNITY): Payer: Self-pay | Admitting: Hematology and Oncology

## 2020-08-28 ENCOUNTER — Other Ambulatory Visit: Payer: Self-pay

## 2020-08-28 ENCOUNTER — Inpatient Hospital Stay (HOSPITAL_COMMUNITY): Payer: PPO | Admitting: Hematology and Oncology

## 2020-08-28 VITALS — BP 156/68 | HR 69 | Temp 98.4°F | Resp 16 | Wt 179.8 lb

## 2020-08-28 DIAGNOSIS — H9192 Unspecified hearing loss, left ear: Secondary | ICD-10-CM

## 2020-08-28 DIAGNOSIS — C099 Malignant neoplasm of tonsil, unspecified: Secondary | ICD-10-CM

## 2020-08-28 DIAGNOSIS — Z95828 Presence of other vascular implants and grafts: Secondary | ICD-10-CM | POA: Diagnosis not present

## 2020-08-28 DIAGNOSIS — R1310 Dysphagia, unspecified: Secondary | ICD-10-CM | POA: Diagnosis not present

## 2020-08-28 DIAGNOSIS — I890 Lymphedema, not elsewhere classified: Secondary | ICD-10-CM | POA: Diagnosis not present

## 2020-08-28 MED ORDER — HEPARIN SOD (PORK) LOCK FLUSH 100 UNIT/ML IV SOLN
500.0000 [IU] | Freq: Once | INTRAVENOUS | Status: AC
  Administered 2020-08-28: 16:00:00 500 [IU] via INTRAVENOUS

## 2020-08-28 MED ORDER — SODIUM CHLORIDE 0.9% FLUSH
10.0000 mL | INTRAVENOUS | Status: AC | PRN
  Administered 2020-08-28: 16:00:00 10 mL via INTRAVENOUS

## 2020-08-28 NOTE — Progress Notes (Signed)
[facility] progress notes  Patient Care Team: Redmond School, MD as PCP - General (Internal Medicine) Satira Sark, MD as PCP - Cardiology (Cardiology) Gala Romney Cristopher Estimable, MD as Consulting Physician (Gastroenterology) Derek Jack, MD as Consulting Physician (Medical Oncology) Warren Danes, PA-C as Physician Assistant (Dermatology)  CHIEF COMPLAINTS/PURPOSE OF VISIT:  Tonsil cancer, for further evaluation  HISTORY OF PRESENTING ILLNESS:  Kristi Weiss 75 y.o. female is seen today because her primary oncologist is not available She has completed chemoradiation therapy last year She returns for further follow-up She has intermittent neck discomfort She has intermittent left-sided hearing changes She has chronic dysphagia, stable.  No recent choking She has noted some limitation of range of motion on her neck No recent dentition issue She had recent follow-up with radiation oncologist No new lymphadenopathy  I reviewed the patient's records extensive and collaborated the history with the patient. 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[place])    07/26/2019 Cancer Staging   Staging form: Pharynx - HPV-Mediated Oropharynx, AJCC 8th Edition - Clinical stage from 07/26/2019: Stage I (cT2, cN1, cM0, p16+) - Signed by Derek Jack, MD on 07/26/2019    08/16/2019 -  Chemotherapy   The patient had palonosetron (ALOXI) injection 0.25 mg, 0.25 mg, Intravenous,  Once, 6 of 7 cycles Administration: 0.25 mg (08/16/2019), 0.25 mg (08/23/2019), 0.25 mg (08/30/2019), 0.25 mg (09/06/2019), 0.25 mg (09/13/2019), 0.25 mg (09/20/2019) CISplatin (PLATINOL) 84 mg in sodium chloride 0.9 % 250 mL chemo infusion, 40 mg/m2 = 84 mg, Intravenous,  Once, 6 of 7 cycles Administration: 84 mg (08/16/2019), 84 mg (08/23/2019), 84 mg  (08/30/2019), 84 mg (09/06/2019), 84 mg (09/13/2019), 84 mg (09/20/2019) fosaprepitant (EMEND) 150 mg in sodium chloride 0.9 % 145 mL IVPB, 150 mg, Intravenous,  Once, 6 of 7 cycles Administration: 150 mg (08/16/2019), 150 mg (08/23/2019), 150 mg (08/30/2019), 150 mg (09/06/2019), 150 mg (09/13/2019), 150 mg (09/20/2019)   for chemotherapy treatment.       MEDICAL HISTORY:  Past Medical History:  Diagnosis Date   Anxiety about health 08/23/2019   Arthritis    Atypical mole 06/30/2001   features of dys nevus- upper left back    Atypical mole 04/04/2020   mod-left upper arm -   GERD (gastroesophageal reflux disease)    Hypothyroidism    Hypothyroidism (acquired) 08/23/2019   Melanocytic nevus with features of Dysplastic Nevus 06/30/2001   Upper Left Back   Port-A-Cath in place 08/09/2019   SCC (squamous cell carcinoma) 04/12/2012   Left Forehead (Cx3,5FU)   SCC (squamous cell carcinoma) 03/16/2018   left upper cheek cx3 83fu   Scoliosis    Sinusitis    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    SURGICAL HISTORY: Past Surgical History:  Procedure Laterality Date   NO PAST SURGERIES     No prior surgery     PORTACATH PLACEMENT Right 08/01/2019   Procedure: INSERTION PORT-A-CATH;  Surgeon: Aviva Signs, MD;  Location: [facility];  Service: General;  Laterality: Right;    SOCIAL HISTORY: Social History   Socioeconomic History   Marital status: Widowed    Spouse name: Not on file   Number of children: 1   Years of education: Not on file  Highest education level: Not on file  Occupational History   Occupation: RETIRED  Tobacco Use   Smoking status: Former    Pack years: 0.00   Smokeless tobacco: Never   Tobacco comments:    some in her 54s and 21s  Vaping Use   Vaping Use: Never used  Substance and Sexual Activity   Alcohol use: Yes    Comment: on rare occasion "like twice a  year"   Drug use: Never   Sexual activity: Not Currently  Other Topics Concern   Not on file  Social History Narrative   Lives at home alone   Retired   Widow   Caffeine: about 4 cups coffee, 3 glasses of tea daily   Social Determinants of Radio broadcast assistant Strain: Not on file  Food Insecurity: Not on file  Transportation Needs: Not on file  Physical Activity: Not on file  Stress: Not on file  Social Connections: Not on file  Intimate Partner Violence: Not on file    FAMILY HISTORY: Family History  Problem Relation Age of Onset   Lung cancer Mother        smoker   Cancer Mother        Throat   Lung cancer Father        smoker   Diabetes Father    CVA Brother    Diabetes Brother    Diabetes Maternal Grandmother    Heart attack Paternal Grandfather    Migraines Neg Hx    Headache Neg Hx     ALLERGIES:  is allergic to sulfamethoxazole-trimethoprim and penicillins.  MEDICATIONS:  Current Outpatient Medications  Medication Sig Dispense Refill   esomeprazole (NEXIUM) 40 MG capsule Take 1 capsule (40 mg total) by mouth daily. 90 capsule 2   levothyroxine (SYNTHROID) 50 MCG tablet Take 1 tablet (50 mcg total) by mouth daily. BRAND 90 tablet 2   ibuprofen (ADVIL) 200 MG tablet Take 400-600 mg by mouth every 8 (eight) hours as needed for moderate pain. (Patient not taking: Reported on 08/28/2020)     No current facility-administered medications for this visit.    REVIEW OF SYSTEMS:   Constitutional: Denies fevers, chills or abnormal night sweats Eyes: Denies blurriness of vision, double vision or watery eyes Ears, nose, mouth, throat, and face: Denies mucositis or sore throat Respiratory: Denies cough, dyspnea or wheezes Cardiovascular: Denies palpitation, chest discomfort or lower extremity swelling Gastrointestinal:  Denies nausea, heartburn or change in bowel habits Skin: Denies abnormal skin rashes Lymphatics: Denies new lymphadenopathy or easy  bruising Neurological:Denies numbness, tingling or new weaknesses Behavioral/Psych: Mood is stable, no new changes  All other systems were reviewed with the patient and are negative.  PHYSICAL EXAMINATION: ECOG PERFORMANCE STATUS: 1 - Symptomatic but completely ambulatory  Vitals:   08/28/20 1513  BP: (!) 156/68  Pulse: 69  Resp: 16  Temp: 98.4 F (36.9 C)  SpO2: 98%   Filed Weights   08/28/20 1513  Weight: 179 lb 12.8 oz (81.6 kg)    GENERAL:alert, no distress and comfortable SKIN: skin color, texture, turgor are normal, no rashes or significant lesions EYES: normal, conjunctiva are pink and non-injected, sclera clear OROPHARYNX:no exudate, normal lips, buccal mucosa, and tongue  NECK: Noted radiation induced fibrosis and acquired lymphedema around her neck. LYMPH:  no palpable lymphadenopathy in the cervical, axillary or inguinal LUNGS: clear to auscultation and percussion with normal breathing effort HEART: regular rate & rhythm and no murmurs without lower extremity edema ABDOMEN:abdomen  soft, non-tender and normal bowel sounds Musculoskeletal:no cyanosis of digits and no clubbing  PSYCH: alert & oriented x 3 with fluent speech NEURO: no focal motor/sensory deficits  LABORATORY DATA:  I have reviewed the data as listed Lab Results  Component Value Date   WBC 5.4 08/21/2020   HGB 11.9 (L) 08/21/2020   HCT 36.6 08/21/2020   MCV 94.3 08/21/2020   PLT 228 08/21/2020   Recent Labs    10/11/19 0928 10/26/19 0925 11/20/19 1450 12/18/19 1343 04/01/20 1423 08/21/20 1351  NA 136 135 137 135 137 138  K 3.9 3.9 4.0 3.9 4.1 3.8  CL 95* 96* 99 97* 101 101  CO2 30 28 28 28 29 29   GLUCOSE 108* 126* 114* 110* 94 86  BUN 13 23 22 23 19 17   CREATININE 0.95 1.22* 1.21* 1.14* 1.09* 0.92  CALCIUM 8.9 9.1 9.2 9.2 9.3 9.0  GFRNONAA 60* 44* 45* 48* 54* >60  GFRAA >60 51* 52*  --   --   --   PROT 6.4* 7.0 7.2 7.2 7.2 6.9  ALBUMIN 3.6 3.9 3.9 3.9 4.0 4.0  AST 14* 18 18 18 15  19   ALT 12 18 15 13 14 15   ALKPHOS 80 80 73 78 67 80  BILITOT 0.5 0.6 0.7 0.8 0.7 0.8   ASSESSMENT & PLAN:  Primary tonsillar squamous cell carcinoma (HCC) Her examination is benign She is reminded to continue close follow-up with ENT I will schedule her port flush today and she will return again in 3 months for further follow-up  Acquired lymphedema She has thickening of skin around her neck from lymphedema, secondary to side effects of radiation We discussed the importance of frequent neck movement daily to reduce limitation of range of movement  I suspect her neck discomfort is related to this  Dysphagia She denies recent choking Observe closely  Left ear hearing loss Her left-sided hearing loss is not consistent It is unilateral I do not believe this is due to side effects of chemotherapy or radiation   No orders of the defined types were placed in this encounter.   All questions were answered. The patient knows to call the clinic with any problems, questions or concerns. The total time spent in the appointment was 20 minutes encounter with patients including review of chart and various tests results, discussions about plan of care and coordination of care plan   Heath Lark, MD 08/28/2020 3:31 PM

## 2020-08-28 NOTE — Assessment & Plan Note (Signed)
Her left-sided hearing loss is not consistent It is unilateral I do not believe this is due to side effects of chemotherapy or radiation

## 2020-08-28 NOTE — Addendum Note (Signed)
Addended by: Joie Bimler on: 08/28/2020 03:43 PM   Modules accepted: Orders, SmartSet

## 2020-08-28 NOTE — Assessment & Plan Note (Signed)
She denies recent choking Observe closely

## 2020-08-28 NOTE — Assessment & Plan Note (Signed)
She has thickening of skin around her neck from lymphedema, secondary to side effects of radiation We discussed the importance of frequent neck movement daily to reduce limitation of range of movement  I suspect her neck discomfort is related to this

## 2020-08-28 NOTE — Assessment & Plan Note (Signed)
Her examination is benign She is reminded to continue close follow-up with ENT I will schedule her port flush today and she will return again in 3 months for further follow-up

## 2020-11-14 ENCOUNTER — Inpatient Hospital Stay (HOSPITAL_COMMUNITY): Payer: PPO | Attending: Hematology | Admitting: Hematology

## 2020-11-14 VITALS — BP 131/70 | HR 79 | Temp 97.6°F | Resp 18 | Wt 178.1 lb

## 2020-11-14 DIAGNOSIS — E039 Hypothyroidism, unspecified: Secondary | ICD-10-CM | POA: Diagnosis not present

## 2020-11-14 DIAGNOSIS — Z9221 Personal history of antineoplastic chemotherapy: Secondary | ICD-10-CM | POA: Diagnosis not present

## 2020-11-14 DIAGNOSIS — R1310 Dysphagia, unspecified: Secondary | ICD-10-CM | POA: Diagnosis not present

## 2020-11-14 DIAGNOSIS — Z79899 Other long term (current) drug therapy: Secondary | ICD-10-CM | POA: Diagnosis not present

## 2020-11-14 DIAGNOSIS — C099 Malignant neoplasm of tonsil, unspecified: Secondary | ICD-10-CM

## 2020-11-14 DIAGNOSIS — Z923 Personal history of irradiation: Secondary | ICD-10-CM | POA: Insufficient documentation

## 2020-11-14 DIAGNOSIS — Z85818 Personal history of malignant neoplasm of other sites of lip, oral cavity, and pharynx: Secondary | ICD-10-CM | POA: Diagnosis not present

## 2020-11-14 DIAGNOSIS — E041 Nontoxic single thyroid nodule: Secondary | ICD-10-CM | POA: Insufficient documentation

## 2020-11-14 NOTE — Patient Instructions (Addendum)
[place] at [facility] Discharge Instructions  You were seen today by Dr. Delton Coombes. He went over your recent results. You will be referred to Dr. Jenetta Downer for a consultation concerning your stomach discomfort. You will be scheduled for a CT of your neck prior to your next appointment. Dr. Delton Coombes will see you back in at your next scheduled appointment for labs and follow up.   Thank you for choosing [place] at [facility] to provide your oncology and hematology care.  To afford each patient quality time with our provider, please arrive at least 15 minutes before your scheduled appointment time.   If you have a lab appointment with the [facility] please come in thru the Main Entrance and check in at the main information desk  You need to re-schedule your appointment should you arrive 10 or more minutes late.  We strive to give you quality time with our providers, and arriving late affects you and other patients whose appointments are after yours.  Also, if you no show three or more times for appointments you may be dismissed from the clinic at the providers discretion.     Again, thank you for choosing [facility].  Our hope is that these requests will decrease the amount of time that you wait before being seen by our physicians.       _____________________________________________________________  Should you have questions after your visit to [facility], please contact our office at [phone] between the hours of 8:00 a.m. and 4:30 p.m.  Voicemails left after 4:00 p.m. will not be returned until the following business day.  For prescription refill requests, have your pharmacy contact our office and allow 72 hours.    Cancer Center Support Programs:   > Cancer Support Group  2nd Tuesday of the month 1pm-2pm, Journey Room

## 2020-11-14 NOTE — Progress Notes (Signed)
[place], [place] [address]   CLINIC:  Medical Oncology/Hematology  PCP:  Kristi Weiss, Kristi Weiss [id number] [phone]   REASON FOR VISIT:  Follow-up for left tonsillar cancer  PRIOR THERAPY:  1. Cisplatin & Aloxi x 6 cycles from 08/16/2019 to 09/20/2019 2. Chemoradiation therapy from to 10/05/2019.  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[place])   07/26/2019 Cancer Staging   Staging form: Pharynx - HPV-Mediated Oropharynx, AJCC 8th Edition - Clinical stage from 07/26/2019: Stage I (cT2, cN1, cM0, p16+) - Signed by Derek Jack, MD on 07/26/2019   08/16/2019 -  Chemotherapy   The patient had palonosetron (ALOXI) injection 0.25 mg, 0.25 mg, Intravenous,  Once, 6 of 7 cycles Administration: 0.25 mg (08/16/2019), 0.25 mg (08/23/2019), 0.25 mg (08/30/2019), 0.25 mg (09/06/2019), 0.25 mg (09/13/2019), 0.25 mg (09/20/2019) CISplatin (PLATINOL) 84 mg in sodium chloride 0.9 % 250 mL chemo infusion, 40 mg/m2 = 84 mg, Intravenous,  Once, 6 of 7 cycles Administration: 84 mg (08/16/2019), 84 mg (08/23/2019), 84 mg (08/30/2019), 84 mg (09/06/2019), 84 mg (09/13/2019), 84 mg (09/20/2019) fosaprepitant (EMEND) 150 mg in sodium chloride 0.9 % 145 mL IVPB, 150 mg, Intravenous,  Once, 6 of 7 cycles Administration: 150 mg (08/16/2019), 150 mg (08/23/2019), 150 mg (08/30/2019), 150 mg (09/06/2019), 150 mg (09/13/2019), 150 mg (09/20/2019)   for chemotherapy treatment.       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, a 75 y.o. female, returns for routine follow-up of her tonsillar cancer. [name] was last seen on 05/23/2020.   Today she reports feeling good. She reports red streaks in the back of her throat, and she denies hemoptysis. She reports stomach pain with irritation after eating. She denies history of stomach ulcers or other stomach problems. She denies indigestion. She reports difficulty swallowing larger pills and foods.   REVIEW OF SYSTEMS:  Review of Systems  Constitutional:  Negative for appetite change (75%) and fatigue.  HENT:   Positive for trouble swallowing.   Respiratory:  Positive for hemoptysis.   Gastrointestinal:  Positive for abdominal pain (stomach).  Neurological:  Positive for dizziness and headaches.  Psychiatric/Behavioral:  The patient is nervous/anxious.   All other systems reviewed and are negative.  PAST MEDICAL/SURGICAL HISTORY:  Past Medical History:  Diagnosis Date   Anxiety about health 08/23/2019   Arthritis    Atypical mole 06/30/2001   features of dys nevus- upper left back    Atypical mole 04/04/2020   mod-left upper arm -   GERD (gastroesophageal reflux disease)    Hypothyroidism    Hypothyroidism (acquired) 08/23/2019   Melanocytic nevus with features of Dysplastic Nevus 06/30/2001   Upper Left Back   Port-A-Cath in place 08/09/2019   SCC (squamous cell carcinoma) 04/12/2012   Left Forehead (Cx3,5FU)   SCC (squamous cell carcinoma) 03/16/2018   left upper cheek cx3 60f   Scoliosis    Sinusitis    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   Past Surgical History:  Procedure Laterality Date   NO PAST SURGERIES     No prior surgery     PORTACATH PLACEMENT Right 08/01/2019   Procedure: INSERTION PORT-A-CATH;  Surgeon: Aviva Signs, MD;  Location: [facility];  Service: General;  Laterality: Right;    SOCIAL HISTORY:  Social History    Socioeconomic History   Marital status: Widowed    Spouse name: Not on file   Number of children: 1   Years of education: Not on file   Highest education level: Not on file  Occupational History   Occupation: RETIRED  Tobacco Use   Smoking status: Former   Smokeless tobacco: Never   Tobacco comments:    some in her 21s and 27s  Vaping Use   Vaping Use: Never used  Substance and Sexual Activity   Alcohol use: Yes    Comment: on rare occasion "like twice a year"   Drug use: Never   Sexual activity: Not Currently  Other Topics Concern   Not on file  Social History Narrative   Lives at home alone   Retired   Widow   Caffeine: about 4 cups coffee, 3 glasses of tea daily   Social Determinants of Radio broadcast assistant Strain: Not on file  Food Insecurity: Not on file  Transportation Needs: Not on file  Physical Activity: Not on file  Stress: Not on file  Social Connections: Not on file  Intimate Partner Violence: Not on file    FAMILY HISTORY:  Family History  Problem Relation Age of Onset   Lung cancer Mother        smoker   Cancer Mother        Throat   Lung cancer Father        smoker   Diabetes Father    CVA Brother    Diabetes Brother    Diabetes Maternal Grandmother    Heart attack Paternal Grandfather    Migraines Neg Hx    Headache Neg Hx     CURRENT MEDICATIONS:  Current Outpatient Medications  Medication Sig Dispense Refill   esomeprazole (NEXIUM) 40 MG capsule Take 1 capsule (40 mg total) by mouth daily. 90 capsule 2   ibuprofen (ADVIL) 200 MG tablet Take 400-600 mg by mouth every 8 (eight) hours as needed for moderate pain. (Patient not taking: Reported on 08/28/2020)     levothyroxine (SYNTHROID) 50 MCG tablet Take 1 tablet (50 mcg total) by mouth daily. BRAND 90 tablet 2   No current facility-administered medications for this visit.   Facility-Administered Medications Ordered in Other Visits  Medication Dose Route Frequency Provider  Last Rate Last Admin   sodium chloride flush (NS) 0.9 % injection 10 mL  10 mL Intravenous PRN Alvy Bimler, Ni, MD   10 mL at 08/28/20 1541    ALLERGIES:  Allergies  Allergen Reactions   Sulfamethoxazole-Trimethoprim Other (See Comments)    Headaches   Penicillins Rash    20 years    PHYSICAL EXAM:  Performance status (ECOG): 1 - Symptomatic but completely ambulatory  There were no vitals filed for this visit. Wt Readings from Last 3 Encounters:  08/28/20 179 lb 12.8 oz (81.6 kg)  05/23/20 176 lb 4.8 oz (80 kg)  04/03/20 177 lb 11.2 oz (80.6 kg)   Physical Exam Vitals reviewed.  Constitutional:      Appearance: Normal appearance.  HENT:     Mouth/Throat:  Comments: L side red streak Cardiovascular:     Rate and Rhythm: Normal rate and regular rhythm.     Pulses: Normal pulses.     Heart sounds: Normal heart sounds.  Pulmonary:     Effort: Pulmonary effort is normal.     Breath sounds: Normal breath sounds.  Lymphadenopathy:     Cervical: No cervical adenopathy.     Right cervical: No superficial, deep or posterior cervical adenopathy.    Left cervical: No superficial, deep or posterior cervical adenopathy.  Neurological:     General: No focal deficit present.     Mental Status: She is alert and oriented to person, place, and time.  Psychiatric:        Mood and Affect: Mood normal.        Behavior: Behavior normal.     LABORATORY DATA:  I have reviewed the labs as listed.  CBC Latest Ref Rng & Units 08/21/2020 04/01/2020 12/18/2019  WBC 4.0 - 10.5 K/uL 5.4 6.2 5.6  Hemoglobin 12.0 - 15.0 g/dL 11.9(L) 11.7(L) 10.8(L)  Hematocrit 36.0 - 46.0 % 36.6 36.0 32.3(L)  Platelets 150 - 400 K/uL 228 234 255   CMP Latest Ref Rng & Units 08/21/2020 04/01/2020 12/18/2019  Glucose 70 - 99 mg/dL 86 94 110(H)  BUN 8 - 23 mg/dL '17 19 23  '$ Creatinine 0.44 - 1.00 mg/dL 0.92 1.09(H) 1.14(H)  Sodium 135 - 145 mmol/L 138 137 135  Potassium 3.5 - 5.1 mmol/L 3.8 4.1 3.9  Chloride 98 -  111 mmol/L 101 101 97(L)  CO2 22 - 32 mmol/L '29 29 28  '$ Calcium 8.9 - 10.3 mg/dL 9.0 9.3 9.2  Total Protein 6.5 - 8.1 g/dL 6.9 7.2 7.2  Total Bilirubin 0.3 - 1.2 mg/dL 0.8 0.7 0.8  Alkaline Phos 38 - 126 U/L 80 67 78  AST 15 - 41 U/L '19 15 18  '$ ALT 0 - 44 U/L '15 14 13    '$ DIAGNOSTIC IMAGING:  I have independently reviewed the scans and discussed with the patient. No results found.   ASSESSMENT:  1.  Squamous cell carcinoma, p16 positive of the left tonsil: - 06/29/2019 Biopsy A. LYMPH NODE, LEFT NECK, NEEDLE CORE BIOPSY:  - Squamous cell carcinoma, p16 positive.  COMMENT:  The carcinoma is positive with p16, p40, cytokeratin 5/6 and p63 consistent with squamous cell carcinoma.  The carcinoma is negative with Epstein-Barr virus (EBV), TTF-1 and Napsin-A.  -PET scan on 07/18/2019 showed left level 2A lymph node, 1 cm.  Small rounded lymph node 8 mm at level 2B on the left side.  Intense palatine tonsillar uptake with asymmetric fullness of tonsillar tissue and symmetrical FDG activity with masslike changes on the left side measuring 2.2 x 1.7 cm compared to the right.  SUV 16.2.  Small focus of increased activity in the right neck adjacent to or within the small nodule in the right hemithyroid. -An ultrasound of the thyroid gland showed heterogeneous thyroid with no masses. -She was evaluated by Dr.Yanagihara. -XRT with weekly cisplatin started on 08/16/2019.  Week 6 of cisplatin on 09/20/2019. -XRT completed on 10/05/2019. -PET CT scan on 12/25/2019 showed asymmetric hypermetabolic activity in the right lingual tonsil, similar to pretreatment PET scan.  Hypermetabolic right level 2 lymph node measures 5 mm with SUV 5.5.  A second lymph node measures 5 mm with SUV 4.3.  Complete resolution of the left tonsil, and left level 2 lymph nodes. -PET scan on 04/01/2020 with no hypermetabolic metastatic disease.  Previously  seen small hypermetabolic right level 2 neck lymph nodes have resolved.  No tonsillar  hypermetabolism.   PLAN:  1.  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 - She reportedly saw a red streak in her throat and she came in to check it out today. - Physical examination showed mild erythema at the left tonsillar fossa.  This is slightly more erythematous than the right side.  Likely postradiation change.  No masses in the oropharynx.  No neck palpable adenopathy.  There is thickening of the neck on the left side likely from radiation changes. - She reports discomfort in her stomach for the past few months.  She has been taking Nexium for many years.  This discomfort does not improve with Nexium.  We will make a referral to GI. - She will come back end of this month.  We plan to repeat a CT scan of the neck with contrast prior to next visit along with labs.      2.  Hypothyroidism: - Continue Synthroid daily.  Labs will be checked at next visit.   3.  Difficulty swallowing: - She has some difficulty eating foods like breads.  However she is maintaining her weight.   4.  Thyroid nodules: - Left thyroid nodule biopsy on 05/08/2020 consistent with benign follicular nodule, Bethesda category type II.  Right thyroid FNA was Bethesda category 1.    Orders placed this encounter:  No orders of the defined types were placed in this encounter.    Derek Jack, MD [place] [phone]   I, Thana Ates, am acting as a scribe for Dr. Derek Jack.  I, Derek Jack MD, have reviewed the above documentation for accuracy and completeness, and I agree with the above.

## 2020-11-16 ENCOUNTER — Encounter (HOSPITAL_COMMUNITY): Payer: Self-pay | Admitting: Hematology

## 2020-11-18 ENCOUNTER — Encounter (INDEPENDENT_AMBULATORY_CARE_PROVIDER_SITE_OTHER): Payer: Self-pay | Admitting: *Deleted

## 2020-11-19 ENCOUNTER — Other Ambulatory Visit: Payer: Self-pay

## 2020-11-19 ENCOUNTER — Ambulatory Visit: Payer: PPO | Admitting: Physician Assistant

## 2020-11-19 DIAGNOSIS — Z87898 Personal history of other specified conditions: Secondary | ICD-10-CM

## 2020-11-19 DIAGNOSIS — Z86018 Personal history of other benign neoplasm: Secondary | ICD-10-CM | POA: Diagnosis not present

## 2020-11-20 ENCOUNTER — Encounter: Payer: Self-pay | Admitting: Physician Assistant

## 2020-11-20 NOTE — Progress Notes (Signed)
   Follow-Up Visit   Subjective  Kristi Weiss is a 75 y.o. female who presents for the following: Follow-up (Patient here today for follow up from wider shave on left upper arm per patient the area did heal well. No new concerns. ).   The following portions of the chart were reviewed this encounter and updated as appropriate:  Tobacco  Allergies  Meds  Problems  Med Hx  Surg Hx  Fam Hx      Objective  Well appearing patient in no apparent distress; mood and affect are within normal limits.  All skin waist up examined.  Left Upper Arm - Anterior Dyspigmented scar.    Assessment & Plan  History of atypical nevus Left Upper Arm - Anterior  Scar is clear. Patient will follow up with yearly skin exam.     I, Falen Lehrmann, PA-C, have reviewed all documentation's for this visit.  The documentation on 11/20/20 for the exam, diagnosis, procedures and orders are all accurate and complete.

## 2020-11-26 DIAGNOSIS — R109 Unspecified abdominal pain: Secondary | ICD-10-CM | POA: Diagnosis not present

## 2020-11-26 DIAGNOSIS — E039 Hypothyroidism, unspecified: Secondary | ICD-10-CM | POA: Diagnosis not present

## 2020-11-26 DIAGNOSIS — K219 Gastro-esophageal reflux disease without esophagitis: Secondary | ICD-10-CM | POA: Diagnosis not present

## 2020-11-26 DIAGNOSIS — I10 Essential (primary) hypertension: Secondary | ICD-10-CM | POA: Diagnosis not present

## 2020-11-27 ENCOUNTER — Encounter (HOSPITAL_COMMUNITY): Payer: Self-pay

## 2020-11-27 ENCOUNTER — Inpatient Hospital Stay (HOSPITAL_COMMUNITY): Payer: PPO

## 2020-11-27 ENCOUNTER — Other Ambulatory Visit: Payer: Self-pay

## 2020-11-27 DIAGNOSIS — C099 Malignant neoplasm of tonsil, unspecified: Secondary | ICD-10-CM

## 2020-11-27 DIAGNOSIS — Z85818 Personal history of malignant neoplasm of other sites of lip, oral cavity, and pharynx: Secondary | ICD-10-CM | POA: Diagnosis not present

## 2020-11-27 LAB — CBC WITH DIFFERENTIAL/PLATELET
Abs Immature Granulocytes: 0.02 10*3/uL (ref 0.00–0.07)
Basophils Absolute: 0 10*3/uL (ref 0.0–0.1)
Basophils Relative: 1 %
Eosinophils Absolute: 0.1 10*3/uL (ref 0.0–0.5)
Eosinophils Relative: 2 %
HCT: 36.8 % (ref 36.0–46.0)
Hemoglobin: 11.9 g/dL — ABNORMAL LOW (ref 12.0–15.0)
Immature Granulocytes: 0 %
Lymphocytes Relative: 21 %
Lymphs Abs: 1.3 10*3/uL (ref 0.7–4.0)
MCH: 30.5 pg (ref 26.0–34.0)
MCHC: 32.3 g/dL (ref 30.0–36.0)
MCV: 94.4 fL (ref 80.0–100.0)
Monocytes Absolute: 0.4 10*3/uL (ref 0.1–1.0)
Monocytes Relative: 7 %
Neutro Abs: 4.3 10*3/uL (ref 1.7–7.7)
Neutrophils Relative %: 69 %
Platelets: 249 10*3/uL (ref 150–400)
RBC: 3.9 MIL/uL (ref 3.87–5.11)
RDW: 13.1 % (ref 11.5–15.5)
WBC: 6.1 10*3/uL (ref 4.0–10.5)
nRBC: 0 % (ref 0.0–0.2)

## 2020-11-27 LAB — COMPREHENSIVE METABOLIC PANEL
ALT: 15 U/L (ref 0–44)
AST: 21 U/L (ref 15–41)
Albumin: 4 g/dL (ref 3.5–5.0)
Alkaline Phosphatase: 85 U/L (ref 38–126)
Anion gap: 8 (ref 5–15)
BUN: 15 mg/dL (ref 8–23)
CO2: 27 mmol/L (ref 22–32)
Calcium: 8.8 mg/dL — ABNORMAL LOW (ref 8.9–10.3)
Chloride: 99 mmol/L (ref 98–111)
Creatinine, Ser: 1.1 mg/dL — ABNORMAL HIGH (ref 0.44–1.00)
GFR, Estimated: 53 mL/min — ABNORMAL LOW (ref >60)
Glucose, Bld: 102 mg/dL — ABNORMAL HIGH (ref 70–99)
Potassium: 3.9 mmol/L (ref 3.5–5.1)
Sodium: 134 mmol/L — ABNORMAL LOW (ref 135–145)
Total Bilirubin: 0.4 mg/dL (ref 0.3–1.2)
Total Protein: 6.7 g/dL (ref 6.5–8.1)

## 2020-11-27 LAB — TSH: TSH: 2.888 u[IU]/mL (ref 0.350–4.500)

## 2020-11-27 MED ORDER — SODIUM CHLORIDE 0.9% FLUSH
10.0000 mL | Freq: Once | INTRAVENOUS | Status: AC
  Administered 2020-11-27: 10 mL via INTRAVENOUS

## 2020-11-27 MED ORDER — HEPARIN SOD (PORK) LOCK FLUSH 100 UNIT/ML IV SOLN
500.0000 [IU] | Freq: Once | INTRAVENOUS | Status: AC
  Administered 2020-11-27: 500 [IU] via INTRAVENOUS

## 2020-11-27 NOTE — Progress Notes (Signed)
Port flushed with good blood return noted. Labs drawn. No bruising or swelling at site. Bandaid applied and patient discharged in satisfactory condition. VVS stable with no signs or symptoms of distressed noted.  

## 2020-11-27 NOTE — Patient Instructions (Signed)
[facility]  Discharge Instructions: Thank you for choosing [facility] to provide your oncology and hematology care.  If you have a lab appointment with the Cancer Center, please come in thru the Main Entrance and check in at the main information desk.  Wear comfortable clothing and clothing appropriate for easy access to any Portacath or PICC line.   We strive to give you quality time with your provider. You may need to reschedule your appointment if you arrive late (15 or more minutes).  Arriving late affects you and other patients whose appointments are after yours.  Also, if you miss three or more appointments without notifying the office, you may be dismissed from the clinic at the provider's discretion.      For prescription refill requests, have your pharmacy contact our office and allow 72 hours for refills to be completed.    Today your port was flushed and labs were drawn, return as scheduled.   To help prevent nausea and vomiting after your treatment, we encourage you to take your nausea medication as directed.  BELOW ARE SYMPTOMS THAT SHOULD BE REPORTED IMMEDIATELY: *FEVER GREATER THAN 100.4 F (38 C) OR HIGHER *CHILLS OR SWEATING *NAUSEA AND VOMITING THAT IS NOT CONTROLLED WITH YOUR NAUSEA MEDICATION *UNUSUAL SHORTNESS OF BREATH *UNUSUAL BRUISING OR BLEEDING *URINARY PROBLEMS (pain or burning when urinating, or frequent urination) *BOWEL PROBLEMS (unusual diarrhea, constipation, pain near the anus) TENDERNESS IN MOUTH AND THROAT WITH OR WITHOUT PRESENCE OF ULCERS (sore throat, sores in mouth, or a toothache) UNUSUAL RASH, SWELLING OR PAIN  UNUSUAL VAGINAL DISCHARGE OR ITCHING   Items with * indicate a potential emergency and should be followed up as soon as possible or go to the Emergency Department if any problems should occur.  Please show the CHEMOTHERAPY ALERT CARD or IMMUNOTHERAPY ALERT CARD at check-in to the Emergency Department and triage  nurse.  Should you have questions after your visit or need to cancel or reschedule your appointment, please contact [facility] [phone]  and follow the prompts.  Office hours are 8:00 a.m. to 4:30 p.m. Monday - Friday. Please note that voicemails left after 4:00 p.m. may not be returned until the following business day.  We are closed weekends and major holidays. You have access to a nurse at all times for urgent questions. Please call the main number to the clinic [phone] and follow the prompts.  For any non-urgent questions, you may also contact your provider using MyChart. We now offer e-Visits for anyone 18 and older to request care online for non-urgent symptoms. For details visit [email or web address].   Also download the MyChart app! Go to the app store, search "MyChart", open the app, select [facility], and log in with your MyChart username and password.  Due to Covid, a mask is required upon entering the hospital/clinic. If you do not have a mask, one will be given to you upon arrival. For doctor visits, patients may have 1 support person aged 18 or older with them. For treatment visits, patients cannot have anyone with them due to current Covid guidelines and our immunocompromised population.  

## 2020-11-28 ENCOUNTER — Other Ambulatory Visit (HOSPITAL_COMMUNITY): Payer: PPO

## 2020-11-29 ENCOUNTER — Ambulatory Visit (HOSPITAL_COMMUNITY): Payer: PPO

## 2020-11-29 ENCOUNTER — Other Ambulatory Visit (HOSPITAL_COMMUNITY): Payer: Self-pay | Admitting: Internal Medicine

## 2020-11-29 ENCOUNTER — Ambulatory Visit (HOSPITAL_COMMUNITY)
Admission: RE | Admit: 2020-11-29 | Discharge: 2020-11-29 | Disposition: A | Discharge status: Home / Self Care | Payer: PPO | Source: Ambulatory Visit | Attending: Hematology | Admitting: Hematology

## 2020-11-29 ENCOUNTER — Other Ambulatory Visit: Payer: Self-pay

## 2020-11-29 DIAGNOSIS — C099 Malignant neoplasm of tonsil, unspecified: Secondary | ICD-10-CM | POA: Insufficient documentation

## 2020-11-29 DIAGNOSIS — Z5111 Encounter for antineoplastic chemotherapy: Secondary | ICD-10-CM | POA: Diagnosis not present

## 2020-11-29 DIAGNOSIS — M419 Scoliosis, unspecified: Secondary | ICD-10-CM | POA: Diagnosis not present

## 2020-11-29 DIAGNOSIS — Z1231 Encounter for screening mammogram for malignant neoplasm of breast: Secondary | ICD-10-CM

## 2020-11-29 DIAGNOSIS — E041 Nontoxic single thyroid nodule: Secondary | ICD-10-CM | POA: Diagnosis not present

## 2020-11-29 MED ORDER — IOHEXOL 350 MG/ML SOLN
60.0000 mL | Freq: Once | INTRAVENOUS | Status: AC | PRN
  Administered 2020-11-29: 60 mL via INTRAVENOUS

## 2020-12-04 NOTE — Progress Notes (Signed)
Kristi Weiss, [place] [address]   CLINIC:  Medical Oncology/Hematology  PCP:  [facility], [address] / [place] [address] [phone]   REASON FOR VISIT:  Follow-up for left tonsillar cancer  PRIOR THERAPY:  1. Cisplatin & Aloxi x 6 cycles from 08/16/2019 to 09/20/2019 2. Chemoradiation therapy from to 10/05/2019.  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[place])   07/26/2019 Cancer Staging   Staging form: Pharynx - HPV-Mediated Oropharynx, AJCC 8th Edition - Clinical stage from 07/26/2019: Stage I (cT2, cN1, cM0, p16+) - Signed by Derek Jack, MD on 07/26/2019   08/16/2019 -  Chemotherapy   The patient had palonosetron (ALOXI) injection 0.25 mg, 0.25 mg, Intravenous,  Once, 6 of 7 cycles Administration: 0.25 mg (08/16/2019), 0.25 mg (08/23/2019), 0.25 mg (08/30/2019), 0.25 mg (09/06/2019), 0.25 mg (09/13/2019), 0.25 mg (09/20/2019) CISplatin (PLATINOL) 84 mg in sodium chloride 0.9 % 250 mL chemo infusion, 40 mg/m2 = 84 mg, Intravenous,  Once, 6 of 7 cycles Administration: 84 mg (08/16/2019), 84 mg (08/23/2019), 84 mg (08/30/2019), 84 mg (09/06/2019), 84 mg (09/13/2019), 84 mg (09/20/2019) fosaprepitant (EMEND) 150 mg in sodium chloride 0.9 % 145 mL IVPB, 150 mg, Intravenous,  Once, 6 of 7 cycles Administration: 150 mg (08/16/2019), 150 mg (08/23/2019), 150 mg (08/30/2019), 150 mg (09/06/2019), 150 mg (09/13/2019), 150 mg (09/20/2019)   for chemotherapy treatment.       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, a 75 y.o. female, returns for routine follow-up of her left tonsillar cancer. Kristi Weiss was last seen on 11/14/2020.   Today she reports feeling good. She reports continued loss of taste, but reports good appetite. She feels a thickening at the back of her throat but denies trouble swallowing and difficulty opening her mouth. She reports occasional stomach upset. She reports occasional popping when opening her mouth. She reports decreased focus, dizziness, and headaches; she denies fatigue. The dizziness is temporarily alleviated after popping her jaw.   REVIEW OF SYSTEMS:  Review of Systems  Constitutional:  Negative for appetite change and fatigue (75%).  HENT:   Negative for trouble swallowing.   Gastrointestinal:  Positive for abdominal pain (stomach upset).  Neurological:  Positive for dizziness and headaches.  Psychiatric/Behavioral:  Positive for decreased concentration (decreased focus). The patient is nervous/anxious.   All other systems reviewed and are negative.  PAST MEDICAL/SURGICAL HISTORY:  Past Medical History:  Diagnosis Date   Anxiety about health 08/23/2019   Arthritis    Atypical mole 06/30/2001   features of dys nevus- upper left back    Atypical mole 04/04/2020   mod-left upper arm -   GERD (gastroesophageal reflux disease)    Hypothyroidism    Hypothyroidism (acquired) 08/23/2019   Melanocytic nevus with features of Dysplastic Nevus 06/30/2001   Upper Left Back   Port-A-Cath in place 08/09/2019   SCC (squamous cell carcinoma) 04/12/2012   Left Forehead (Cx3,5FU)   SCC (squamous cell carcinoma) 03/16/2018   left upper cheek cx3 34fu   Scoliosis    Sinusitis    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   Past Surgical History:  Procedure Laterality Date   NO PAST SURGERIES     No prior surgery     PORTACATH PLACEMENT Right 08/01/2019   Procedure:  INSERTION PORT-A-CATH;  Surgeon: Aviva Signs, MD;  Location: [facility];  Service: General;  Laterality: Right;    SOCIAL HISTORY:  Social History   Socioeconomic History   Marital status: Widowed    Spouse name: Not on file   Number of children: 1   Years of education: Not on file   Highest education level: Not on file  Occupational History   Occupation: RETIRED  Tobacco Use   Smoking status: Former   Smokeless tobacco: Never   Tobacco comments:    some in her 56s and 55s  Vaping Use   Vaping Use: Never used  Substance and Sexual Activity   Alcohol use: Yes    Comment: on rare occasion "like twice a year"   Drug use: Never   Sexual activity: Not Currently  Other Topics Concern   Not on file  Social History Narrative   Lives at home alone   Retired   Widow   Caffeine: about 4 cups coffee, 3 glasses of tea daily   Social Determinants of Radio broadcast assistant Strain: Not on file  Food Insecurity: Not on file  Transportation Needs: Not on file  Physical Activity: Not on file  Stress: Not on file  Social Connections: Not on file  Intimate Partner Violence: Not on file    FAMILY HISTORY:  Family History  Problem Relation Age of Onset   Lung cancer Mother        smoker   Cancer Mother        Throat   Lung cancer Father        smoker   Diabetes Father    CVA Brother    Diabetes Brother    Diabetes Maternal Grandmother    Heart attack Paternal Grandfather    Migraines Neg Hx    Headache Neg Hx     CURRENT MEDICATIONS:  Current Outpatient Medications  Medication Sig Dispense Refill   azithromycin (ZITHROMAX) 250 MG tablet Take 250 mg by mouth as directed.     esomeprazole (NEXIUM) 40 MG capsule Take 1 capsule (40 mg total) by mouth daily. 90 capsule 2   ibuprofen (ADVIL) 200 MG tablet Take 400-600 mg by mouth every 8 (eight) hours as needed for moderate pain.     levothyroxine (SYNTHROID) 50 MCG tablet Take 1 tablet (50 mcg total) by mouth daily. BRAND  90 tablet 2   sucralfate (CARAFATE) 1 g tablet Take 1 g by mouth at bedtime.     No current facility-administered medications for this visit.   Facility-Administered Medications Ordered in Other Visits  Medication Dose Route Frequency Provider Last Rate Last Admin   sodium chloride flush (NS) 0.9 % injection 10 mL  10 mL Intravenous PRN Alvy Bimler, Ni, MD   10 mL at 08/28/20 1541    ALLERGIES:  Allergies  Allergen Reactions   Sulfamethoxazole-Trimethoprim Other (See Comments)    Headaches   Penicillins Rash    20 years    PHYSICAL EXAM:  Performance status (ECOG): 1 - Symptomatic but completely ambulatory  There were no vitals filed for this visit. Wt Readings from Last 3 Encounters:  11/14/20 178 lb 1.6 oz (  80.8 kg)  08/28/20 179 lb 12.8 oz (81.6 kg)  05/23/20 176 lb 4.8 oz (80 kg)   Physical Exam Vitals reviewed.  Constitutional:      Appearance: Normal appearance.  HENT:     Mouth/Throat:     Comments: Mild erythema in L tonsil area Cardiovascular:     Rate and Rhythm: Normal rate and regular rhythm.     Pulses: Normal pulses.     Heart sounds: Normal heart sounds.  Pulmonary:     Effort: Pulmonary effort is normal.     Breath sounds: Normal breath sounds.  Neurological:     General: No focal deficit present.     Mental Status: She is alert and oriented to person, place, and time.  Psychiatric:        Mood and Affect: Mood normal.        Behavior: Behavior normal.     LABORATORY DATA:  I have reviewed the labs as listed.  CBC Latest Ref Rng & Units 11/27/2020 08/21/2020 04/01/2020  WBC 4.0 - 10.5 K/uL 6.1 5.4 6.2  Hemoglobin 12.0 - 15.0 g/dL 11.9(L) 11.9(L) 11.7(L)  Hematocrit 36.0 - 46.0 % 36.8 36.6 36.0  Platelets 150 - 400 K/uL 249 228 234   CMP Latest Ref Rng & Units 11/27/2020 08/21/2020 04/01/2020  Glucose 70 - 99 mg/dL 102(H) 86 94  BUN 8 - 23 mg/dL 15 17 19   Creatinine 0.44 - 1.00 mg/dL 1.10(H) 0.92 1.09(H)  Sodium 135 - 145 mmol/L 134(L) 138 137   Potassium 3.5 - 5.1 mmol/L 3.9 3.8 4.1  Chloride 98 - 111 mmol/L 99 101 101  CO2 22 - 32 mmol/L 27 29 29   Calcium 8.9 - 10.3 mg/dL 8.8(L) 9.0 9.3  Total Protein 6.5 - 8.1 g/dL 6.7 6.9 7.2  Total Bilirubin 0.3 - 1.2 mg/dL 0.4 0.8 0.7  Alkaline Phos 38 - 126 U/L 85 80 67  AST 15 - 41 U/L 21 19 15   ALT 0 - 44 U/L 15 15 14     DIAGNOSTIC IMAGING:  I have independently reviewed the scans and discussed with the patient. CT SOFT TISSUE NECK W CONTRAST  Result Date: 11/30/2020 CLINICAL DATA:  Squamous cell carcinoma of the tonsil. Status post chemotherapy and radiation. EXAM: CT NECK WITH CONTRAST TECHNIQUE: Multidetector CT imaging of the neck was performed using the standard protocol following the bolus administration of intravenous contrast. CONTRAST:  75mL OMNIPAQUE IOHEXOL 350 MG/ML SOLN COMPARISON:  CT neck with contrast 11/20/2019 and 06/16/2019. PET scan 04/01/2020. FINDINGS: Pharynx and larynx: Post radiation changes noted in the oropharynx. No residual or recurrent tumor. Nasopharyngeal mucosa within normal limits. Soft tissue prominence in the left tonsillar region extending inferiorly along the left aryepiglottic fold is stable. No mass lesion. Vocal cords are visualized and within normal limits. Trachea is unremarkable. Salivary glands: Post radiation changes noted with asymmetric fatty infiltration of the left submandibular and parotid glands. No focal lesions. Right submandibular and parotid glands within normal limits. Thyroid: Posteroinferior left thyroid nodule is stable. This is the nodule that was biopsied. No new nodules are present. Lymph nodes: No significant cervical adenopathy is present. Bilateral subcentimeter level 2 lymph nodes are smaller than on the prior exam. No new or enlarging nodes are present. Vascular: Atherosclerotic calcifications are again noted at the aortic arch. Calcifications are present at the carotid bifurcations bilaterally without significant stenosis. Venous  structures are within normal limits. Limited intracranial: Within normal limits. Visualized orbits: Bilateral lens replacements are noted. Globes and orbits  are otherwise unremarkable. Mastoids and visualized paranasal sinuses: Mild circumferential mucosal thickening is present in the inferior right maxillary sinus. No fluid levels are present. The paranasal sinuses and mastoid air cells are otherwise clear. Skeleton: Asymmetric right-sided degenerative changes are again noted in the cervical spine. Asymmetric degenerative changes also noted in the upper thoracic spine, associated with scoliosis. No focal osseous lesions are present. Upper chest: Lung apices are clear. Thoracic inlet is within normal limits. Right subclavian Port-A-Cath noted. IMPRESSION: 1. Post radiation changes in the oropharynx and larynx without evidence for residual or recurrent tumor. 2. No significant cervical adenopathy. 3. Stable appearance of left thyroid nodule. This is the nodule that was biopsied. No new nodules are present. This has been evaluated on previous imaging. No follow-up necessary. (Ref: J Am Coll Radiol. 2015 Feb;12(2): 143-50). 4. Aortic Atherosclerosis (ICD10-I70.0). Electronically Signed   By: San Morelle M.D.   On: 11/30/2020 06:09     ASSESSMENT:  1.  Squamous cell carcinoma, p16 positive of the left tonsil: - 06/29/2019 Biopsy A. LYMPH NODE, LEFT NECK, NEEDLE CORE BIOPSY:  - Squamous cell carcinoma, p16 positive.  COMMENT:  The carcinoma is positive with p16, p40, cytokeratin 5/6 and p63 consistent with squamous cell carcinoma.  The carcinoma is negative with Epstein-Barr virus (EBV), TTF-1 and Napsin-A.  -PET scan on 07/18/2019 showed left level 2A lymph node, 1 cm.  Small rounded lymph node 8 mm at level 2B on the left side.  Intense palatine tonsillar uptake with asymmetric fullness of tonsillar tissue and symmetrical FDG activity with masslike changes on the left side measuring 2.2 x 1.7 cm  compared to the right.  SUV 16.2.  Small focus of increased activity in the right neck adjacent to or within the small nodule in the right hemithyroid. -An ultrasound of the thyroid gland showed heterogeneous thyroid with no masses. -She was evaluated by Dr.Yanagihara. -XRT with weekly cisplatin started on 08/16/2019.  Week 6 of cisplatin on 09/20/2019. -XRT completed on 10/05/2019. -PET CT scan on 12/25/2019 showed asymmetric hypermetabolic activity in the right lingual tonsil, similar to pretreatment PET scan.  Hypermetabolic right level 2 lymph node measures 5 mm with SUV 5.5.  A second lymph node measures 5 mm with SUV 4.3.  Complete resolution of the left tonsil, and left level 2 lymph nodes. -PET scan on 04/01/2020 with no hypermetabolic metastatic disease.  Previously seen small hypermetabolic right level 2 neck lymph nodes have resolved.  No tonsillar hypermetabolism.   PLAN:  1.  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  Postradiation skin thickening in the left side of the neck and submandibular region.  No palpable adenopathy. - Reviewed CT soft tissue neck from 11/29/2020 which showed postradiation changes in the oropharynx and larynx without evidence of residual tumor.  No significant adenopathy.  Stable left thyroid nodule. - She reports gastric discomfort which is not improved with Nexium.  Dr. Gerarda Fraction give her Carafate which she has not started yet.  She has an appointment to see GI early next year.  She was told to call us back if symptoms does not get better. - She will continue follow-ups with Dr. Benjamine Mola. - RTC 6 months for follow-up with repeat CT soft tissue neck, TSH and other labs.     2.  Hypothyroidism: - TSH today is 2.88.  Continue Synthroid daily.   3.  Difficulty  swallowing: - She has some difficulty eating foods like breads.  She is maintaining her  weight.   4.  Thyroid nodules: - Left thyroid nodule biopsy on 05/08/2020 consistent with benign follicular nodule, Bethesda category type II.  Right thyroid FNA was Bethesda category 1.   Orders placed this encounter:  No orders of the defined types were placed in this encounter.    Derek Jack, MD [facility] [phone]   I, Thana Ates, am acting as a scribe for Dr. Derek Jack.  I, Derek Jack MD, have reviewed the above documentation for accuracy and completeness, and I agree with the above.

## 2020-12-05 ENCOUNTER — Other Ambulatory Visit: Payer: Self-pay

## 2020-12-05 ENCOUNTER — Ambulatory Visit (HOSPITAL_COMMUNITY)
Admission: RE | Admit: 2020-12-05 | Discharge: 2020-12-05 | Disposition: A | Discharge status: Home / Self Care | Payer: PPO | Source: Ambulatory Visit | Attending: Internal Medicine | Admitting: Internal Medicine

## 2020-12-05 ENCOUNTER — Inpatient Hospital Stay (HOSPITAL_COMMUNITY): Payer: PPO | Attending: Hematology | Admitting: Hematology

## 2020-12-05 VITALS — BP 138/65 | HR 67 | Temp 97.0°F | Resp 18 | Wt 180.7 lb

## 2020-12-05 DIAGNOSIS — Z1231 Encounter for screening mammogram for malignant neoplasm of breast: Secondary | ICD-10-CM | POA: Diagnosis not present

## 2020-12-05 DIAGNOSIS — Z923 Personal history of irradiation: Secondary | ICD-10-CM | POA: Insufficient documentation

## 2020-12-05 DIAGNOSIS — R1310 Dysphagia, unspecified: Secondary | ICD-10-CM | POA: Insufficient documentation

## 2020-12-05 DIAGNOSIS — Z79899 Other long term (current) drug therapy: Secondary | ICD-10-CM | POA: Insufficient documentation

## 2020-12-05 DIAGNOSIS — E041 Nontoxic single thyroid nodule: Secondary | ICD-10-CM | POA: Insufficient documentation

## 2020-12-05 DIAGNOSIS — C099 Malignant neoplasm of tonsil, unspecified: Secondary | ICD-10-CM | POA: Diagnosis not present

## 2020-12-05 DIAGNOSIS — E039 Hypothyroidism, unspecified: Secondary | ICD-10-CM | POA: Insufficient documentation

## 2020-12-05 DIAGNOSIS — Z85818 Personal history of malignant neoplasm of other sites of lip, oral cavity, and pharynx: Secondary | ICD-10-CM | POA: Diagnosis not present

## 2020-12-05 DIAGNOSIS — Z9221 Personal history of antineoplastic chemotherapy: Secondary | ICD-10-CM | POA: Diagnosis not present

## 2020-12-05 NOTE — Patient Instructions (Addendum)
[place] [facility] at [facility] Discharge Instructions  You were seen and examined today by Dr. Delton Coombes. Please follow up as scheduled in 6 months with labs.   Thank you for choosing [place] at [facility] to provide your oncology and hematology care.  To afford each patient quality time with our provider, please arrive at least 15 minutes before your scheduled appointment time.   If you have a lab appointment with the [facility] please come in thru the Main Entrance and check in at the main information desk.  You need to re-schedule your appointment should you arrive 10 or more minutes late.  We strive to give you quality time with our providers, and arriving late affects you and other patients whose appointments are after yours.  Also, if you no show three or more times for appointments you may be dismissed from the clinic at the providers discretion.     Again, thank you for choosing [facility].  Our hope is that these requests will decrease the amount of time that you wait before being seen by our physicians.       _____________________________________________________________  Should you have questions after your visit to [facility], please contact our office at [phone] and follow the prompts.  Our office hours are 8:00 a.m. and 4:30 p.m. Monday - Friday.  Please note that voicemails left after 4:00 p.m. may not be returned until the following business day.  We are closed weekends and major holidays.  You do have access to a nurse 24-7, just call the main number to the clinic [phone] and do not press any options, hold on the line and a nurse will answer the phone.    For prescription refill requests, have your pharmacy contact our office and allow 72 hours.    Due to Covid, you will need to wear a mask upon entering the hospital. If you do not have a mask, a mask will be given to you at the Main Entrance  upon arrival. For doctor visits, patients may have 1 support person age 73 or older with them. For treatment visits, patients can not have anyone with them due to social distancing guidelines and our immunocompromised population.

## 2020-12-11 ENCOUNTER — Other Ambulatory Visit (HOSPITAL_COMMUNITY): Payer: Self-pay | Admitting: Internal Medicine

## 2020-12-11 DIAGNOSIS — R928 Other abnormal and inconclusive findings on diagnostic imaging of breast: Secondary | ICD-10-CM

## 2020-12-16 DIAGNOSIS — M1991 Primary osteoarthritis, unspecified site: Secondary | ICD-10-CM | POA: Diagnosis not present

## 2020-12-16 DIAGNOSIS — Z6827 Body mass index (BMI) 27.0-27.9, adult: Secondary | ICD-10-CM | POA: Diagnosis not present

## 2020-12-16 DIAGNOSIS — E6609 Other obesity due to excess calories: Secondary | ICD-10-CM | POA: Diagnosis not present

## 2020-12-16 DIAGNOSIS — I10 Essential (primary) hypertension: Secondary | ICD-10-CM | POA: Diagnosis not present

## 2020-12-16 DIAGNOSIS — J329 Chronic sinusitis, unspecified: Secondary | ICD-10-CM | POA: Diagnosis not present

## 2020-12-25 ENCOUNTER — Ambulatory Visit (HOSPITAL_COMMUNITY)
Admission: RE | Admit: 2020-12-25 | Discharge: 2020-12-25 | Disposition: A | Discharge status: Home / Self Care | Payer: PPO | Source: Ambulatory Visit | Attending: Internal Medicine | Admitting: Internal Medicine

## 2020-12-25 ENCOUNTER — Other Ambulatory Visit: Payer: Self-pay

## 2020-12-25 ENCOUNTER — Encounter (HOSPITAL_COMMUNITY): Payer: Self-pay

## 2020-12-25 DIAGNOSIS — R928 Other abnormal and inconclusive findings on diagnostic imaging of breast: Secondary | ICD-10-CM | POA: Insufficient documentation

## 2020-12-25 DIAGNOSIS — R922 Inconclusive mammogram: Secondary | ICD-10-CM | POA: Diagnosis not present

## 2020-12-25 DIAGNOSIS — R921 Mammographic calcification found on diagnostic imaging of breast: Secondary | ICD-10-CM | POA: Diagnosis not present

## 2021-01-08 DIAGNOSIS — R1310 Dysphagia, unspecified: Secondary | ICD-10-CM | POA: Diagnosis not present

## 2021-01-08 DIAGNOSIS — Z79890 Hormone replacement therapy (postmenopausal): Secondary | ICD-10-CM | POA: Diagnosis not present

## 2021-01-08 DIAGNOSIS — Z85818 Personal history of malignant neoplasm of other sites of lip, oral cavity, and pharynx: Secondary | ICD-10-CM | POA: Diagnosis not present

## 2021-01-08 DIAGNOSIS — H9042 Sensorineural hearing loss, unilateral, left ear, with unrestricted hearing on the contralateral side: Secondary | ICD-10-CM | POA: Diagnosis not present

## 2021-01-08 DIAGNOSIS — E042 Nontoxic multinodular goiter: Secondary | ICD-10-CM | POA: Diagnosis not present

## 2021-01-08 DIAGNOSIS — Z08 Encounter for follow-up examination after completed treatment for malignant neoplasm: Secondary | ICD-10-CM | POA: Diagnosis not present

## 2021-01-08 DIAGNOSIS — Z9221 Personal history of antineoplastic chemotherapy: Secondary | ICD-10-CM | POA: Diagnosis not present

## 2021-01-08 DIAGNOSIS — Z923 Personal history of irradiation: Secondary | ICD-10-CM | POA: Diagnosis not present

## 2021-01-08 DIAGNOSIS — K117 Disturbances of salivary secretion: Secondary | ICD-10-CM | POA: Diagnosis not present

## 2021-01-08 DIAGNOSIS — C099 Malignant neoplasm of tonsil, unspecified: Secondary | ICD-10-CM | POA: Diagnosis not present

## 2021-01-08 DIAGNOSIS — E039 Hypothyroidism, unspecified: Secondary | ICD-10-CM | POA: Diagnosis not present

## 2021-01-15 DIAGNOSIS — Z23 Encounter for immunization: Secondary | ICD-10-CM | POA: Diagnosis not present

## 2021-01-15 DIAGNOSIS — S90859A Superficial foreign body, unspecified foot, initial encounter: Secondary | ICD-10-CM | POA: Diagnosis not present

## 2021-01-15 DIAGNOSIS — Z6826 Body mass index (BMI) 26.0-26.9, adult: Secondary | ICD-10-CM | POA: Diagnosis not present

## 2021-01-15 DIAGNOSIS — E663 Overweight: Secondary | ICD-10-CM | POA: Diagnosis not present

## 2021-01-17 DIAGNOSIS — H9313 Tinnitus, bilateral: Secondary | ICD-10-CM | POA: Diagnosis not present

## 2021-01-17 DIAGNOSIS — Z85819 Personal history of malignant neoplasm of unspecified site of lip, oral cavity, and pharynx: Secondary | ICD-10-CM | POA: Diagnosis not present

## 2021-01-17 DIAGNOSIS — H903 Sensorineural hearing loss, bilateral: Secondary | ICD-10-CM | POA: Diagnosis not present

## 2021-01-17 DIAGNOSIS — H7202 Central perforation of tympanic membrane, left ear: Secondary | ICD-10-CM | POA: Diagnosis not present

## 2021-02-12 ENCOUNTER — Ambulatory Visit (INDEPENDENT_AMBULATORY_CARE_PROVIDER_SITE_OTHER): Payer: PPO | Admitting: Gastroenterology

## 2021-03-06 ENCOUNTER — Other Ambulatory Visit: Payer: Self-pay

## 2021-03-06 ENCOUNTER — Inpatient Hospital Stay (HOSPITAL_COMMUNITY): Payer: PPO | Attending: Hematology

## 2021-03-06 DIAGNOSIS — Z85818 Personal history of malignant neoplasm of other sites of lip, oral cavity, and pharynx: Secondary | ICD-10-CM | POA: Insufficient documentation

## 2021-03-06 DIAGNOSIS — R450 Nervousness: Secondary | ICD-10-CM | POA: Diagnosis not present

## 2021-03-06 DIAGNOSIS — C099 Malignant neoplasm of tonsil, unspecified: Secondary | ICD-10-CM

## 2021-03-06 LAB — CBC WITH DIFFERENTIAL/PLATELET
Abs Immature Granulocytes: 0.01 10*3/uL (ref 0.00–0.07)
Basophils Absolute: 0 10*3/uL (ref 0.0–0.1)
Basophils Relative: 1 %
Eosinophils Absolute: 0.1 10*3/uL (ref 0.0–0.5)
Eosinophils Relative: 2 %
HCT: 38.1 % (ref 36.0–46.0)
Hemoglobin: 12.5 g/dL (ref 12.0–15.0)
Immature Granulocytes: 0 %
Lymphocytes Relative: 26 %
Lymphs Abs: 1.5 10*3/uL (ref 0.7–4.0)
MCH: 30.6 pg (ref 26.0–34.0)
MCHC: 32.8 g/dL (ref 30.0–36.0)
MCV: 93.4 fL (ref 80.0–100.0)
Monocytes Absolute: 0.4 10*3/uL (ref 0.1–1.0)
Monocytes Relative: 7 %
Neutro Abs: 3.7 10*3/uL (ref 1.7–7.7)
Neutrophils Relative %: 64 %
Platelets: 269 10*3/uL (ref 150–400)
RBC: 4.08 MIL/uL (ref 3.87–5.11)
RDW: 13.1 % (ref 11.5–15.5)
WBC: 5.8 10*3/uL (ref 4.0–10.5)
nRBC: 0 % (ref 0.0–0.2)

## 2021-03-06 LAB — COMPREHENSIVE METABOLIC PANEL
ALT: 13 U/L (ref 0–44)
AST: 17 U/L (ref 15–41)
Albumin: 4.1 g/dL (ref 3.5–5.0)
Alkaline Phosphatase: 79 U/L (ref 38–126)
Anion gap: 11 (ref 5–15)
BUN: 14 mg/dL (ref 8–23)
CO2: 26 mmol/L (ref 22–32)
Calcium: 8.9 mg/dL (ref 8.9–10.3)
Chloride: 103 mmol/L (ref 98–111)
Creatinine, Ser: 1.15 mg/dL — ABNORMAL HIGH (ref 0.44–1.00)
GFR, Estimated: 50 mL/min — ABNORMAL LOW (ref >60)
Glucose, Bld: 94 mg/dL (ref 70–99)
Potassium: 3.8 mmol/L (ref 3.5–5.1)
Sodium: 140 mmol/L (ref 135–145)
Total Bilirubin: 0.4 mg/dL (ref 0.3–1.2)
Total Protein: 6.8 g/dL (ref 6.5–8.1)

## 2021-03-06 LAB — TSH: TSH: 2.807 u[IU]/mL (ref 0.350–4.500)

## 2021-03-06 MED ORDER — HEPARIN SOD (PORK) LOCK FLUSH 100 UNIT/ML IV SOLN
500.0000 [IU] | Freq: Once | INTRAVENOUS | Status: AC
  Administered 2021-03-06: 500 [IU] via INTRAVENOUS

## 2021-03-06 MED ORDER — SODIUM CHLORIDE 0.9% FLUSH
10.0000 mL | Freq: Once | INTRAVENOUS | Status: AC
  Administered 2021-03-06: 10 mL via INTRAVENOUS

## 2021-03-06 NOTE — Progress Notes (Signed)
Patients port flushed without difficulty.  Good blood return noted with no bruising or swelling noted at site.  Band aid applied.  VSS with discharge and left in satisfactory condition with no s/s of distress noted.   

## 2021-03-06 NOTE — Patient Instructions (Signed)
[facility]  Discharge Instructions: Thank you for choosing [facility] to provide your oncology and hematology care.  If you have a lab appointment with the Cancer Center, please come in thru the Main Entrance and check in at the main information desk.  Wear comfortable clothing and clothing appropriate for easy access to any Portacath or PICC line.   We strive to give you quality time with your provider. You may need to reschedule your appointment if you arrive late (15 or more minutes).  Arriving late affects you and other patients whose appointments are after yours.  Also, if you miss three or more appointments without notifying the office, you may be dismissed from the clinic at the provider's discretion.      For prescription refill requests, have your pharmacy contact our office and allow 72 hours for refills to be completed.    Today you received the following chemotherapy and/or immunotherapy agents PORT flush labs      To help prevent nausea and vomiting after your treatment, we encourage you to take your nausea medication as directed.  BELOW ARE SYMPTOMS THAT SHOULD BE REPORTED IMMEDIATELY: *FEVER GREATER THAN 100.4 F (38 C) OR HIGHER *CHILLS OR SWEATING *NAUSEA AND VOMITING THAT IS NOT CONTROLLED WITH YOUR NAUSEA MEDICATION *UNUSUAL SHORTNESS OF BREATH *UNUSUAL BRUISING OR BLEEDING *URINARY PROBLEMS (pain or burning when urinating, or frequent urination) *BOWEL PROBLEMS (unusual diarrhea, constipation, pain near the anus) TENDERNESS IN MOUTH AND THROAT WITH OR WITHOUT PRESENCE OF ULCERS (sore throat, sores in mouth, or a toothache) UNUSUAL RASH, SWELLING OR PAIN  UNUSUAL VAGINAL DISCHARGE OR ITCHING   Items with * indicate a potential emergency and should be followed up as soon as possible or go to the Emergency Department if any problems should occur.  Please show the CHEMOTHERAPY ALERT CARD or IMMUNOTHERAPY ALERT CARD at check-in to the Emergency  Department and triage nurse.  Should you have questions after your visit or need to cancel or reschedule your appointment, please contact [facility] [phone]  and follow the prompts.  Office hours are 8:00 a.m. to 4:30 p.m. Monday - Friday. Please note that voicemails left after 4:00 p.m. may not be returned until the following business day.  We are closed weekends and major holidays. You have access to a nurse at all times for urgent questions. Please call the main number to the clinic [phone] and follow the prompts.  For any non-urgent questions, you may also contact your provider using MyChart. We now offer e-Visits for anyone 18 and older to request care online for non-urgent symptoms. For details visit [email or web address].   Also download the MyChart app! Go to the app store, search "MyChart", open the app, select [facility], and log in with your MyChart username and password.  Due to Covid, a mask is required upon entering the hospital/clinic. If you do not have a mask, one will be given to you upon arrival. For doctor visits, patients may have 1 support person aged 18 or older with them. For treatment visits, patients cannot have anyone with them due to current Covid guidelines and our immunocompromised population.  

## 2021-03-19 ENCOUNTER — Ambulatory Visit (INDEPENDENT_AMBULATORY_CARE_PROVIDER_SITE_OTHER): Payer: PPO | Admitting: Gastroenterology

## 2021-03-19 ENCOUNTER — Other Ambulatory Visit: Payer: Self-pay

## 2021-03-19 ENCOUNTER — Encounter (INDEPENDENT_AMBULATORY_CARE_PROVIDER_SITE_OTHER): Payer: Self-pay | Admitting: Gastroenterology

## 2021-03-19 DIAGNOSIS — K219 Gastro-esophageal reflux disease without esophagitis: Secondary | ICD-10-CM

## 2021-03-19 DIAGNOSIS — G8929 Other chronic pain: Secondary | ICD-10-CM | POA: Diagnosis not present

## 2021-03-19 DIAGNOSIS — R1013 Epigastric pain: Secondary | ICD-10-CM | POA: Diagnosis not present

## 2021-03-19 DIAGNOSIS — R432 Parageusia: Secondary | ICD-10-CM

## 2021-03-19 MED ORDER — OMEPRAZOLE 40 MG PO CPDR: 40.0000 mg | DELAYED_RELEASE_CAPSULE | Freq: Every day | ORAL | 3 refills | Status: DC

## 2021-03-19 NOTE — Progress Notes (Signed)
Maylon Peppers, M.D. Gastroenterology & Hepatology [facility] [address] [place], [place] [address] Primary Care Physician: Redmond School, MD [address] [place] [address]  Referring MD: Derek Jack, MD  Chief Complaint:  abdominal pain, GERD  History of Present Illness: Kristi Weiss is a 76 y.o. 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  Patient reports that after she was diagnosed with tonsil cancer she was on liquid diet as she developed significant side effects from treatment. She states that after finishing radiation Tx in 10/2019, she progressively advanced her diet and was able to eat as usual with time. Since then she has noticed that she has had significant abdominal discomfort intermittently - possibly the symptoms started at the end of 2021. She describes the pain as a burning sensation which is worse after eating but she has also felt some pain and "hunger sensation" in the morning prior to eating. Sometimes, the pain can actually improve with food intake. No nighttime symptoms. States that her taste change significantly after she had radiation and chemotherapy. She has also noticed her symptoms worsen when she eats tomatoes, bacon, potato chips.  Patient reports that her PCP put her on Nexium 40 mg BID but she felt the pain was worse with this medication and stopped it. Has tried taking Peptobismol which helps -she takes it intermittently.  Also reports that when she eats she has mild soreness in her throat. No dysphagia, even though in the past she used to have significant dysphagia but this resolved multiple months ago. Has presented recurrent heartburn sensation and reflux for which has been taking Nexium on and off. Has episodes of heartburn a couple of  times a week now.  Was prescribed Carafate recently by her PCP but has not started it yet.  The patient denies having any nausea, vomiting, fever, chills, hematochezia, melena, hematemesis, abdominal distention, diarrhea, jaundice, pruritus . Has gained a few lb recently.  Last SKA:JGOTL Last Colonoscopy:states had one multiple years ago, also reports she had a negative Cologuard last year but no results are available  ADDENDUM:  I received the results of stool testing - she had a negative FOBT on 3/1-05/2018, no Cologuard was performed (test name is ColoCARE)  FHx: neg for any gastrointestinal/liver disease, parents lun cancer Social: smoked for short time in her early eyars, neg alcohol or illicit drug use Surgical: no abdominal surgeries  Past Medical History: Past Medical History:  Diagnosis Date   Anxiety about health 08/23/2019   Arthritis    Atypical mole 06/30/2001   features of dys nevus- upper left back    Atypical mole 04/04/2020   mod-left upper arm -   GERD (gastroesophageal reflux disease)    Hypothyroidism    Hypothyroidism (acquired) 08/23/2019   Melanocytic nevus with features of Dysplastic Nevus 06/30/2001   Upper Left Back   Port-A-Cath in place 08/09/2019   SCC (squamous cell carcinoma) 04/12/2012   Left Forehead (Cx3,5FU)   SCC (squamous cell carcinoma) 03/16/2018   left upper cheek cx3 12fu   Scoliosis    Sinusitis    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    Past Surgical History: Past Surgical History:  Procedure Laterality Date   NO PAST SURGERIES  No prior surgery     PORTACATH PLACEMENT Right 08/01/2019   Procedure: INSERTION PORT-A-CATH;  Surgeon: Aviva Signs, MD;  Location: [facility];  Service: General;  Laterality: Right;    Family History: Family History  Problem Relation Age of Onset   Lung cancer Mother        smoker   Cancer  Mother        Throat   Lung cancer Father        smoker   Diabetes Father    CVA Brother    Diabetes Brother    Diabetes Maternal Grandmother    Heart attack Paternal Grandfather    Migraines Neg Hx    Headache Neg Hx     Social History: Social History   Tobacco Use  Smoking Status Former  Smokeless Tobacco Never  Tobacco Comments   some in her 96s and 30s   Social History   Substance and Sexual Activity  Alcohol Use Yes   Comment: once every 2 years   Social History   Substance and Sexual Activity  Drug Use Never    Allergies: Allergies  Allergen Reactions   Sulfamethoxazole-Trimethoprim Other (See Comments)    Headaches   Penicillins Rash    20 years    Medications: Current Outpatient Medications  Medication Sig Dispense Refill   levothyroxine (SYNTHROID) 50 MCG tablet Take 1 tablet (50 mcg total) by mouth daily. BRAND 90 tablet 2   No current facility-administered medications for this visit.   Facility-Administered Medications Ordered in Other Visits  Medication Dose Route Frequency Provider Last Rate Last Admin   sodium chloride flush (NS) 0.9 % injection 10 mL  10 mL Intravenous PRN Alvy Bimler, Ni, MD   10 mL at 08/28/20 1541    Review of Systems: GENERAL: negative for malaise, night sweats HEENT: No changes in hearing or vision, no nose bleeds or other nasal problems. NECK: Negative for lumps, goiter, pain and significant neck swelling RESPIRATORY: Negative for cough, wheezing CARDIOVASCULAR: Negative for chest pain, leg swelling, palpitations, orthopnea GI: SEE HPI MUSCULOSKELETAL: Negative for joint pain or swelling, back pain, and muscle pain. SKIN: Negative for lesions, rash PSYCH: Negative for sleep disturbance, mood disorder and recent psychosocial stressors. HEMATOLOGY Negative for prolonged bleeding, bruising easily, and swollen nodes. ENDOCRINE: Negative for cold or heat intolerance, polyuria, polydipsia and goiter. NEURO: negative for  tremor, gait imbalance, syncope and seizures. The remainder of the review of systems is noncontributory.   Physical Exam: BP 138/80 (BP Location: Left Arm, Patient Position: Sitting, Cuff Size: Large)    Pulse 82    Temp 98.6 F (37 C) (Oral)    Ht 5\' 7"  (1.702 m)    Wt 173 lb 12.8 oz (78.8 kg)    BMI 27.22 kg/m  GENERAL: The patient is AO x3, in no acute distress. HEENT: Head is normocephalic and atraumatic. EOMI are intact. Mouth is well hydrated and without lesions. NECK: Supple. No masses LUNGS: Clear to auscultation. No presence of rhonchi/wheezing/rales. Adequate chest expansion HEART: RRR, normal s1 and s2. ABDOMEN: Soft, nontender, no guarding, no peritoneal signs, and nondistended. BS +. No masses. EXTREMITIES: Without any cyanosis, clubbing, rash, lesions or edema. NEUROLOGIC: AOx3, no focal motor deficit. SKIN: no jaundice, no rashes   Imaging/Labs: as above  I personally reviewed and interpreted the available labs, imaging and endoscopic files.  Impression and Plan: Kristi Weiss is a 76 y.o. 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  Patient has presented recurrent episodes of vaginal pain, which started after she started her chemoradiation therapy.  Fortunately, she has not presented any red flag signs but the symptoms have not improved.  She has also presented significant dysgeusia.  I explained to her that it is possible her symptoms could be related to the medications and radiation she received in the past and it is possible that they may resolve with time, but we will evaluate her symptoms further with an EGD.  As she has presented recurrent heartburn episodes, we will try another PPI (omeprazole 40 mg every day) and assess her symptom control.  If the patient has a negative work-up and presents persistent symptoms, we can  consider performing a CT of the abdomen and pelvis with IV contrast.  She had a PET scan in 12/25/2019 that did not show any presence of metastatic disease but this may not be the best imaging modality to assess for other alterations causing her symptoms.  - Schedule EGD  - Will request records of Cologuard - Start omeprazole 40 mg qday  All questions were answered.      Maylon Peppers, MD Gastroenterology and Hepatology [facility]  ADDENDUM: I called the patient requesting to send me the report of her upcoming stool test for colorectal cancer screening as she possibly will have a Cologuard performed.  However, she is pick up the phone and I left a message asking to fax these records.

## 2021-03-19 NOTE — Patient Instructions (Addendum)
Schedule EGD  Will request records of Cologuard Start omeprazole 40 mg qday

## 2021-03-20 DIAGNOSIS — W57XXXA Bitten or stung by nonvenomous insect and other nonvenomous arthropods, initial encounter: Secondary | ICD-10-CM | POA: Diagnosis not present

## 2021-03-20 DIAGNOSIS — E663 Overweight: Secondary | ICD-10-CM | POA: Diagnosis not present

## 2021-03-20 DIAGNOSIS — Z6826 Body mass index (BMI) 26.0-26.9, adult: Secondary | ICD-10-CM | POA: Diagnosis not present

## 2021-03-24 ENCOUNTER — Encounter (INDEPENDENT_AMBULATORY_CARE_PROVIDER_SITE_OTHER): Payer: Self-pay

## 2021-04-03 ENCOUNTER — Ambulatory Visit (INDEPENDENT_AMBULATORY_CARE_PROVIDER_SITE_OTHER): Payer: PPO | Admitting: Gastroenterology

## 2021-04-07 DIAGNOSIS — Z6826 Body mass index (BMI) 26.0-26.9, adult: Secondary | ICD-10-CM | POA: Diagnosis not present

## 2021-04-07 DIAGNOSIS — T361X5A Adverse effect of cephalosporins and other beta-lactam antibiotics, initial encounter: Secondary | ICD-10-CM | POA: Diagnosis not present

## 2021-04-07 DIAGNOSIS — E663 Overweight: Secondary | ICD-10-CM | POA: Diagnosis not present

## 2021-04-08 ENCOUNTER — Ambulatory Visit: Payer: PPO | Admitting: Physician Assistant

## 2021-04-09 ENCOUNTER — Ambulatory Visit: Payer: PPO | Admitting: Physician Assistant

## 2021-04-10 ENCOUNTER — Encounter: Payer: Self-pay | Admitting: Physician Assistant

## 2021-04-10 ENCOUNTER — Other Ambulatory Visit: Payer: Self-pay

## 2021-04-10 ENCOUNTER — Ambulatory Visit: Payer: PPO | Admitting: Physician Assistant

## 2021-04-10 DIAGNOSIS — Z85828 Personal history of other malignant neoplasm of skin: Secondary | ICD-10-CM | POA: Diagnosis not present

## 2021-04-10 DIAGNOSIS — Z1283 Encounter for screening for malignant neoplasm of skin: Secondary | ICD-10-CM | POA: Diagnosis not present

## 2021-04-10 DIAGNOSIS — Z86018 Personal history of other benign neoplasm: Secondary | ICD-10-CM

## 2021-04-10 DIAGNOSIS — W57XXXA Bitten or stung by nonvenomous insect and other nonvenomous arthropods, initial encounter: Secondary | ICD-10-CM

## 2021-04-14 DIAGNOSIS — Z1211 Encounter for screening for malignant neoplasm of colon: Secondary | ICD-10-CM | POA: Diagnosis not present

## 2021-05-04 ENCOUNTER — Encounter: Payer: Self-pay | Admitting: Physician Assistant

## 2021-05-04 NOTE — Progress Notes (Signed)
° °  Follow-Up Visit   Subjective  Kristi Weiss is a 76 y.o. female who presents for the following: Annual Exam (Full body skin check, personal history of scc and atypia. No new concerns ).   The following portions of the chart were reviewed this encounter and updated as appropriate:  Tobacco   Allergies   Meds   Problems   Med Hx   Surg Hx   Fam Hx       Objective  Well appearing patient in no apparent distress; mood and affect are within normal limits.  A full examination was performed including scalp, head, eyes, ears, nose, lips, neck, chest, axillae, abdomen, back, buttocks, bilateral upper extremities, bilateral lower extremities, hands, feet, fingers, toes, fingernails, and toenails. All findings within normal limits unless otherwise noted below.  head to toe No atypical nevi No signs of non-mole skin cancer.    Assessment & Plan  Encounter for screening for malignant neoplasm of skin head to toe  Yearly skin examination.   Tick bite, unspecified site, initial encounter  Gave patient Doxy samples for possible tick bites in summer.  No atypical nevi noted at the time of the visit.  I, Shanica Castellanos, PA-C, have reviewed all documentation's for this visit.  The documentation on 05/04/21 for the exam, diagnosis, procedures and orders are all accurate and complete.

## 2021-05-14 ENCOUNTER — Other Ambulatory Visit (HOSPITAL_COMMUNITY): Payer: Self-pay

## 2021-05-14 DIAGNOSIS — C099 Malignant neoplasm of tonsil, unspecified: Secondary | ICD-10-CM

## 2021-05-14 NOTE — Progress Notes (Signed)
Orders placed for labs and CT scan to be done prior to next visit.   ?

## 2021-05-26 DIAGNOSIS — Z6826 Body mass index (BMI) 26.0-26.9, adult: Secondary | ICD-10-CM | POA: Diagnosis not present

## 2021-05-26 DIAGNOSIS — E663 Overweight: Secondary | ICD-10-CM | POA: Diagnosis not present

## 2021-05-26 DIAGNOSIS — B029 Zoster without complications: Secondary | ICD-10-CM | POA: Diagnosis not present

## 2021-06-05 ENCOUNTER — Other Ambulatory Visit (HOSPITAL_COMMUNITY): Payer: PPO

## 2021-06-09 ENCOUNTER — Ambulatory Visit: Payer: PPO

## 2021-06-09 ENCOUNTER — Ambulatory Visit (HOSPITAL_COMMUNITY)
Admission: RE | Admit: 2021-06-09 | Discharge: 2021-06-09 | Disposition: A | Discharge status: Home / Self Care | Payer: PPO | Source: Ambulatory Visit | Attending: Hematology | Admitting: Hematology

## 2021-06-09 ENCOUNTER — Inpatient Hospital Stay (HOSPITAL_COMMUNITY): Payer: PPO | Attending: Hematology

## 2021-06-09 DIAGNOSIS — Z9221 Personal history of antineoplastic chemotherapy: Secondary | ICD-10-CM | POA: Diagnosis not present

## 2021-06-09 DIAGNOSIS — Z85818 Personal history of malignant neoplasm of other sites of lip, oral cavity, and pharynx: Secondary | ICD-10-CM | POA: Diagnosis not present

## 2021-06-09 DIAGNOSIS — M419 Scoliosis, unspecified: Secondary | ICD-10-CM | POA: Diagnosis not present

## 2021-06-09 DIAGNOSIS — M5030 Other cervical disc degeneration, unspecified cervical region: Secondary | ICD-10-CM | POA: Diagnosis not present

## 2021-06-09 DIAGNOSIS — C099 Malignant neoplasm of tonsil, unspecified: Secondary | ICD-10-CM | POA: Diagnosis not present

## 2021-06-09 LAB — CBC WITH DIFFERENTIAL/PLATELET
Abs Immature Granulocytes: 0.01 10*3/uL (ref 0.00–0.07)
Basophils Absolute: 0 10*3/uL (ref 0.0–0.1)
Basophils Relative: 0 %
Eosinophils Absolute: 0.1 10*3/uL (ref 0.0–0.5)
Eosinophils Relative: 2 %
HCT: 38.6 % (ref 36.0–46.0)
Hemoglobin: 12.5 g/dL (ref 12.0–15.0)
Immature Granulocytes: 0 %
Lymphocytes Relative: 17 %
Lymphs Abs: 0.9 10*3/uL (ref 0.7–4.0)
MCH: 30.2 pg (ref 26.0–34.0)
MCHC: 32.4 g/dL (ref 30.0–36.0)
MCV: 93.2 fL (ref 80.0–100.0)
Monocytes Absolute: 0.4 10*3/uL (ref 0.1–1.0)
Monocytes Relative: 8 %
Neutro Abs: 3.5 10*3/uL (ref 1.7–7.7)
Neutrophils Relative %: 73 %
Platelets: 222 10*3/uL (ref 150–400)
RBC: 4.14 MIL/uL (ref 3.87–5.11)
RDW: 14.4 % (ref 11.5–15.5)
WBC: 4.9 10*3/uL (ref 4.0–10.5)
nRBC: 0 % (ref 0.0–0.2)

## 2021-06-09 LAB — COMPREHENSIVE METABOLIC PANEL
ALT: 15 U/L (ref 0–44)
AST: 16 U/L (ref 15–41)
Albumin: 3.9 g/dL (ref 3.5–5.0)
Alkaline Phosphatase: 73 U/L (ref 38–126)
Anion gap: 8 (ref 5–15)
BUN: 14 mg/dL (ref 8–23)
CO2: 27 mmol/L (ref 22–32)
Calcium: 8.7 mg/dL — ABNORMAL LOW (ref 8.9–10.3)
Chloride: 103 mmol/L (ref 98–111)
Creatinine, Ser: 0.94 mg/dL (ref 0.44–1.00)
GFR, Estimated: >60 mL/min (ref >60)
Glucose, Bld: 98 mg/dL (ref 70–99)
Potassium: 3.8 mmol/L (ref 3.5–5.1)
Sodium: 138 mmol/L (ref 135–145)
Total Bilirubin: 0.8 mg/dL (ref 0.3–1.2)
Total Protein: 6.5 g/dL (ref 6.5–8.1)

## 2021-06-09 LAB — TSH: TSH: 3.584 u[IU]/mL (ref 0.350–4.500)

## 2021-06-09 MED ORDER — IOHEXOL 300 MG/ML  SOLN
100.0000 mL | Freq: Once | INTRAMUSCULAR | Status: AC | PRN
  Administered 2021-06-09: 75 mL via INTRAVENOUS

## 2021-06-09 MED ORDER — HEPARIN SOD (PORK) LOCK FLUSH 100 UNIT/ML IV SOLN
INTRAVENOUS | Status: AC
  Filled 2021-06-09: qty 5

## 2021-06-09 MED ORDER — LEVOTHYROXINE SODIUM 50 MCG PO TABS: 50.0000 ug | ORAL_TABLET | Freq: Every day | ORAL | 2 refills | Status: DC

## 2021-06-09 NOTE — Progress Notes (Signed)
Pt presents today for port flush with labs per provider's order. Vital signs stable and pt voiced no new complaints at this time. Port flushed easily without difficulty with good blood return. No bruising or swelling noted at the site. Power port used and patient left accessed for CT scan at [facility] today.  ? ?Discharged from clinic ambulatory in stable condition. Alert and oriented x 3. F/U with [facility] as scheduled.   ?

## 2021-06-12 ENCOUNTER — Ambulatory Visit (HOSPITAL_COMMUNITY): Payer: PPO | Admitting: Hematology

## 2021-06-19 ENCOUNTER — Inpatient Hospital Stay (HOSPITAL_COMMUNITY): Payer: PPO | Admitting: Hematology

## 2021-06-19 VITALS — BP 142/77 | HR 78 | Temp 98.5°F | Resp 16 | Ht 67.0 in | Wt 182.5 lb

## 2021-06-19 DIAGNOSIS — E039 Hypothyroidism, unspecified: Secondary | ICD-10-CM | POA: Insufficient documentation

## 2021-06-19 DIAGNOSIS — C099 Malignant neoplasm of tonsil, unspecified: Secondary | ICD-10-CM | POA: Diagnosis not present

## 2021-06-19 DIAGNOSIS — Z85818 Personal history of malignant neoplasm of other sites of lip, oral cavity, and pharynx: Secondary | ICD-10-CM | POA: Diagnosis not present

## 2021-06-19 DIAGNOSIS — Z79899 Other long term (current) drug therapy: Secondary | ICD-10-CM | POA: Insufficient documentation

## 2021-06-19 NOTE — Progress Notes (Signed)
? ?[name] ?[address] ?[place], [place] [address] ? ? ?CLINIC:  ?Medical Oncology/Hematology ? ?PCP:  ?Redmond School, MD ?[address] / [place] [address] ?[phone] ? ? ?REASON FOR VISIT:  ?Follow-up for squamous cell carcinoma, p16 positive of the left tonsil ? ?PRIOR THERAPY:  ?1. Cisplatin & Aloxi x 6 cycles from 08/16/2019 to 09/20/2019 ?2. Chemoradiation therapy from to 10/05/2019. ? ?NGS Results: not done ? ?CURRENT THERAPY: surveillance ? ?BRIEF ONCOLOGIC HISTORY:  ?Oncology History  ?Primary tonsillar squamous cell carcinoma ([place])  ?07/26/2019 Initial Diagnosis  ? Squamous cell carcinoma of left tonsil (HCC) ? ?  ?07/26/2019 Cancer Staging  ? Staging form: Pharynx - HPV-Mediated Oropharynx, AJCC 8th Edition ?- Clinical stage from 07/26/2019: Stage I (cT2, cN1, cM0, p16+) - Signed by Derek Jack, MD on 07/26/2019 ? ?  ?08/16/2019 -  Chemotherapy  ? The patient had palonosetron (ALOXI) injection 0.25 mg, 0.25 mg, Intravenous,  Once, 6 of 7 cycles ?Administration: 0.25 mg (08/16/2019), 0.25 mg (08/23/2019), 0.25 mg (08/30/2019), 0.25 mg (09/06/2019), 0.25 mg (09/13/2019), 0.25 mg (09/20/2019) ?CISplatin (PLATINOL) 84 mg in sodium chloride 0.9 % 250 mL chemo infusion, 40 mg/m2 = 84 mg, Intravenous,  Once, 6 of 7 cycles ?Administration: 84 mg (08/16/2019), 84 mg (08/23/2019), 84 mg (08/30/2019), 84 mg (09/06/2019), 84 mg (09/13/2019), 84 mg (09/20/2019) ?fosaprepitant (EMEND) 150 mg in sodium chloride 0.9 % 145 mL IVPB, 150 mg, Intravenous,  Once, 6 of 7 cycles ?Administration: 150 mg (08/16/2019), 150 mg (08/23/2019), 150 mg (08/30/2019), 150 mg (09/06/2019), 150 mg (09/13/2019), 150 mg (09/20/2019) ? ? for chemotherapy treatment.  ? ?  ? ? ?CANCER STAGING: ? Cancer Staging  ?Primary tonsillar squamous cell carcinoma ([place]) ?Staging form: Pharynx - HPV-Mediated Oropharynx, AJCC 8th Edition ?- Clinical stage from 07/26/2019: Stage I (cT2, cN1, cM0, p16+) - Signed by Derek Jack, MD on  07/26/2019 ? ? ?INTERVAL HISTORY:  ?[name], a 76 y.o. female, returns for routine follow-up of her squamous cell carcinoma, p16 positive of the left tonsil. Kristi Weiss was last seen on 12/05/2020.  ? ?Today she reports feeling good. She reports sore throat, and she denies trouble swallowing. She denies tingling/numbness.  ? ?REVIEW OF SYSTEMS:  ?Review of Systems  ?Constitutional:  Negative for appetite change and fatigue.  ?HENT:   Positive for sore throat. Negative for trouble swallowing.   ?Neurological:  Positive for headaches. Negative for numbness.  ?All other systems reviewed and are negative. ? ?PAST MEDICAL/SURGICAL HISTORY:  ?Past Medical History:  ?Diagnosis Date  ? Anxiety about health 08/23/2019  ? Arthritis   ? Atypical mole 06/30/2001  ? features of dys nevus- upper left back   ? Atypical mole 04/04/2020  ? mod-left upper arm -  ? GERD (gastroesophageal reflux disease)   ? Hypothyroidism   ? Hypothyroidism (acquired) 08/23/2019  ? Melanocytic nevus with features of Dysplastic Nevus 06/30/2001  ? Upper Left Back  ? Port-A-Cath in place 08/09/2019  ? SCC (squamous cell carcinoma) 04/12/2012  ? Left Forehead (Cx3,5FU)  ? SCC (squamous cell carcinoma) 03/16/2018  ? left upper cheek cx3 31f  ? Scoliosis   ? Sinusitis   ? 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 Left Upper Cheek (Cx3,5FU)  ? Vulvar dysplasia   ? "many many years ago" at 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  ? ?Past Surgical History:  ?Procedure Laterality Date  ? NO PAST SURGERIES    ? No prior surgery    ? PORTACATH PLACEMENT Right  08/01/2019  ? Procedure: INSERTION PORT-A-CATH;  Surgeon: Aviva Signs, MD;  Location: [facility];  Service: General;  Laterality: Right;  ? ? ?SOCIAL HISTORY:  ?Social History  ? ?Socioeconomic History  ? Marital status: Widowed  ?  Spouse name: Not on file  ? Number of children: 1  ? Years of education: Not on file  ? Highest education level: Not on file  ?Occupational History  ? Occupation: RETIRED  ?Tobacco Use  ?  Smoking status: Former  ? Smokeless tobacco: Never  ? Tobacco comments:  ?  some in her 74s and 30s  ?Vaping Use  ? Vaping Use: Never used  ?Substance and Sexual Activity  ? Alcohol use: Yes  ?  Comment: once every 2 years  ? Drug use: Never  ? Sexual activity: Not Currently  ?Other Topics Concern  ? Not on file  ?Social History Narrative  ? Lives at home alone  ? Retired  ? Widow  ? Caffeine: about 4 cups coffee, 3 glasses of tea daily  ? ?Social Determinants of Health  ? ?Financial Resource Strain: Not on file  ?Food Insecurity: Not on file  ?Transportation Needs: Not on file  ?Physical Activity: Not on file  ?Stress: Not on file  ?Social Connections: Not on file  ?Intimate Partner Violence: Not on file  ? ? ?FAMILY HISTORY:  ?Family History  ?Problem Relation Age of Onset  ? Lung cancer Mother   ?     smoker  ? Cancer Mother   ?     Throat  ? Lung cancer Father   ?     smoker  ? Diabetes Father   ? CVA Brother   ? Diabetes Brother   ? Diabetes Maternal Grandmother   ? Heart attack Paternal Grandfather   ? Migraines Neg Hx   ? Headache Neg Hx   ? ? ?CURRENT MEDICATIONS:  ?Current Outpatient Medications  ?Medication Sig Dispense Refill  ? levothyroxine (SYNTHROID) 50 MCG tablet Take 1 tablet (50 mcg total) by mouth daily. BRAND 90 tablet 2  ? omeprazole (PRILOSEC) 40 MG capsule Take 1 capsule (40 mg total) by mouth daily. 90 capsule 3  ? ?No current facility-administered medications for this visit.  ? ?Facility-Administered Medications Ordered in Other Visits  ?Medication Dose Route Frequency Provider Last Rate Last Admin  ? sodium chloride flush (NS) 0.9 % injection 10 mL  10 mL Intravenous PRN Alvy Bimler, Ni, MD   10 mL at 08/28/20 1541  ? ? ?ALLERGIES:  ?Allergies  ?Allergen Reactions  ? Sulfamethoxazole-Trimethoprim Other (See Comments)  ?  Headaches  ? Keflex [Cephalexin]   ?  Rash post neck  ? Penicillins Rash  ?  20 years  ? ? ?PHYSICAL EXAM:  ?Performance status (ECOG): 1 - Symptomatic but completely  ambulatory ? ?Vitals:  ? 06/19/21 1301  ?BP: (!) 142/77  ?Pulse: 78  ?Resp: 16  ?Temp: 98.5 ?F (36.9 ?C)  ?SpO2: 97%  ? ?Wt Readings from Last 3 Encounters:  ?06/19/21 182 lb 8.7 oz (82.8 kg)  ?03/19/21 173 lb 12.8 oz (78.8 kg)  ?12/05/20 180 lb 11.2 oz (82 kg)  ? ?Physical Exam ?Vitals reviewed.  ?Constitutional:   ?   Appearance: Normal appearance.  ?HENT:  ?   Mouth/Throat:  ?   Comments: Erythema on soft palate ?Cardiovascular:  ?   Rate and Rhythm: Normal rate and regular rhythm.  ?   Pulses: Normal pulses.  ?   Heart sounds: Normal heart sounds.  ?Pulmonary:  ?  Effort: Pulmonary effort is normal.  ?   Breath sounds: Normal breath sounds.  ?Lymphadenopathy:  ?   Cervical: No cervical adenopathy.  ?   Right cervical: No superficial, deep or posterior cervical adenopathy. ?   Left cervical: No superficial, deep or posterior cervical adenopathy.  ?Neurological:  ?   General: No focal deficit present.  ?   Mental Status: She is alert and oriented to person, place, and time.  ?Psychiatric:     ?   Mood and Affect: Mood normal.     ?   Behavior: Behavior normal.  ?  ? ?LABORATORY DATA:  ?I have reviewed the labs as listed.  ? ?  Latest Ref Rng & Units 06/09/2021  ?  8:25 AM 03/06/2021  ?  3:12 PM 11/27/2020  ?  3:22 PM  ?CBC  ?WBC 4.0 - 10.5 K/uL 4.9   5.8   6.1    ?Hemoglobin 12.0 - 15.0 g/dL 12.5   12.5   11.9    ?Hematocrit 36.0 - 46.0 % 38.6   38.1   36.8    ?Platelets 150 - 400 K/uL 222   269   249    ? ? ?  Latest Ref Rng & Units 06/09/2021  ?  8:25 AM 03/06/2021  ?  3:12 PM 11/27/2020  ?  3:22 PM  ?CMP  ?Glucose 70 - 99 mg/dL 98   94   102    ?BUN 8 - 23 mg/dL '14   14   15    '$ ?Creatinine 0.44 - 1.00 mg/dL 0.94   1.15   1.10    ?Sodium 135 - 145 mmol/L 138   140   134    ?Potassium 3.5 - 5.1 mmol/L 3.8   3.8   3.9    ?Chloride 98 - 111 mmol/L 103   103   99    ?CO2 22 - 32 mmol/L '27   26   27    '$ ?Calcium 8.9 - 10.3 mg/dL 8.7   8.9   8.8    ?Total Protein 6.5 - 8.1 g/dL 6.5   6.8   6.7    ?Total Bilirubin 0.3 - 1.2  mg/dL 0.8   0.4   0.4    ?Alkaline Phos 38 - 126 U/L 73   79   85    ?AST 15 - 41 U/L '16   17   21    '$ ?ALT 0 - 44 U/L '15   13   15    '$ ? ? ?DIAGNOSTIC IMAGING:  ?I have independently reviewed the scans and discussed wi

## 2021-06-19 NOTE — Patient Instructions (Signed)
[place] at [facility] ?Discharge Instructions ? ? ?You were seen and examined today by Dr. Delton Coombes. ? ?He reviewed the results of your lab work and CT scan. All results are normal/stable.  ? ?We will check back with you in 6 months with a repeat CT scan and lab work.  ? ? ?Thank you for choosing [place] at [facility] to provide your oncology and hematology care.  To afford each patient quality time with our provider, please arrive at least 15 minutes before your scheduled appointment time.  ? ?If you have a lab appointment with the [facility] please come in thru the Main Entrance and check in at the main information desk. ? ?You need to re-schedule your appointment should you arrive 10 or more minutes late.  We strive to give you quality time with our providers, and arriving late affects you and other patients whose appointments are after yours.  Also, if you no show three or more times for appointments you may be dismissed from the clinic at the providers discretion.     ?Again, thank you for choosing [facility].  Our hope is that these requests will decrease the amount of time that you wait before being seen by our physicians.       ?_____________________________________________________________ ? ?Should you have questions after your visit to [facility], please contact our office at [phone] and follow the prompts.  Our office hours are 8:00 a.m. and 4:30 p.m. Monday - Friday.  Please note that voicemails left after 4:00 p.m. may not be returned until the following business day.  We are closed weekends and major holidays.  You do have access to a nurse 24-7, just call the main number to the clinic [phone] and do not press any options, hold on the line and a nurse will answer the phone.   ? ?For prescription refill requests, have your pharmacy contact our office and allow 72 hours.   ? ?Due to Covid, you will need to  wear a mask upon entering the hospital. If you do not have a mask, a mask will be given to you at the Main Entrance upon arrival. For doctor visits, patients may have 1 support person age 49 or older with them. For treatment visits, patients can not have anyone with them due to social distancing guidelines and our immunocompromised population.  ? ?   ?

## 2021-07-03 IMAGING — CT CT PARANASAL SINUSES LIMITED
1 series · 11 of 13 positions shown, 14 images · non-contrast
Comparison: None.

CLINICAL DATA: Headaches

EXAM:
CT PARANASAL SINUS LIMITED WITHOUT CONTRAST
TECHNIQUE: Non-contiguous multidetector CT images of the paranasal sinuses were
obtained in a single plane without contrast.

[Series 3: cor soft · axial · 0.36mm/px · z∈[+1557,+1657]mm · 11 of 13 slices shown, 14 images]
[im 2/13  brain]
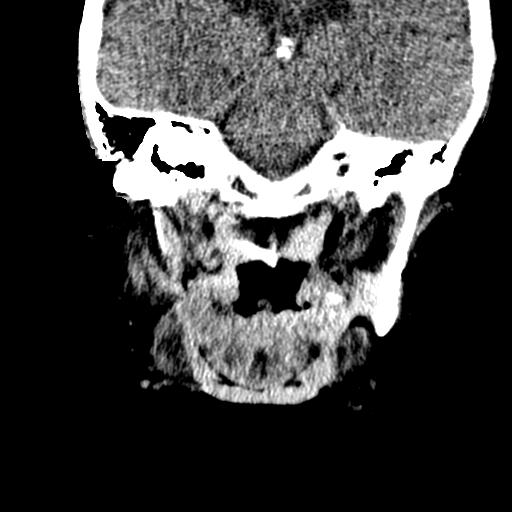
[im 2/13  bone]
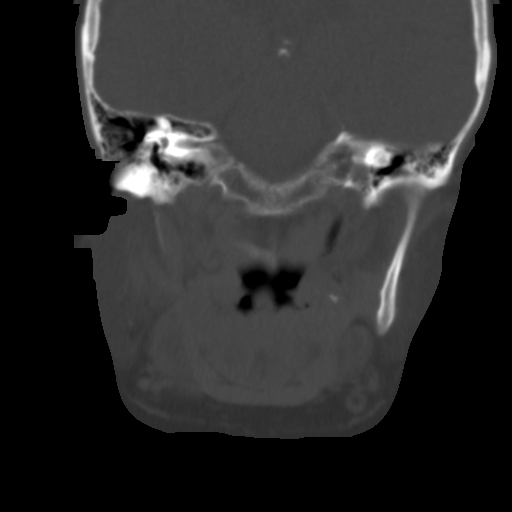
[im 3/13  bone]
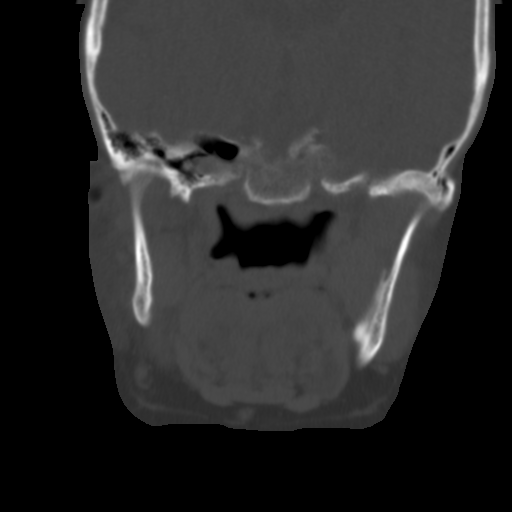
[im 4/13  bone]
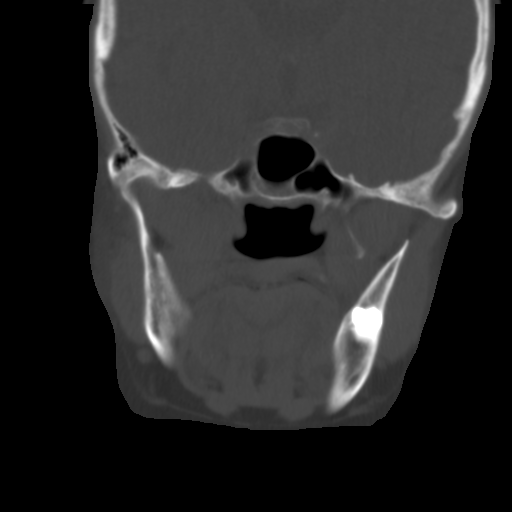
[im 5/13  bone]
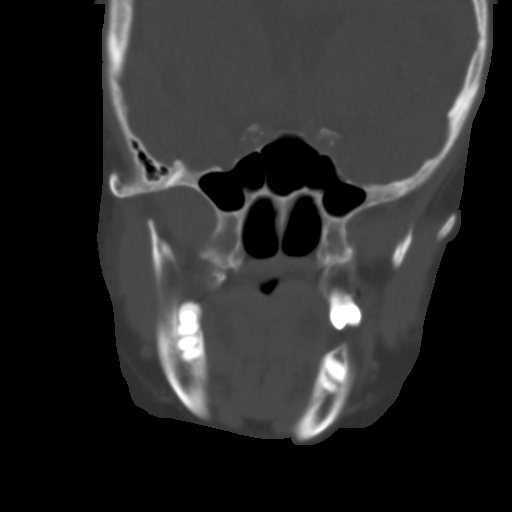
[im 6/13  brain]
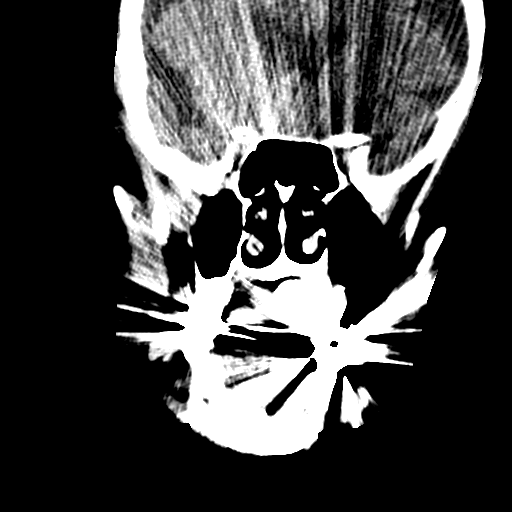
[im 6/13  bone]
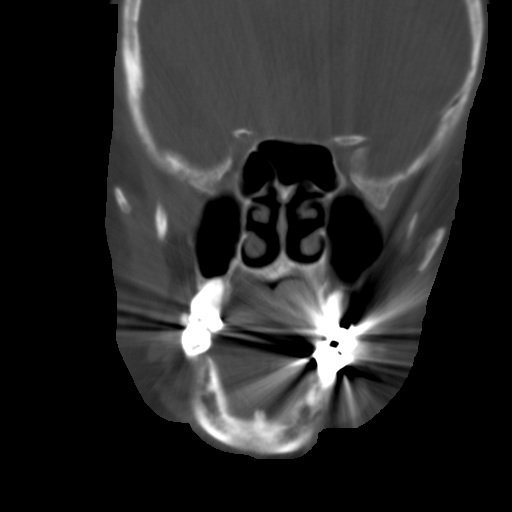
[im 7/13  bone]
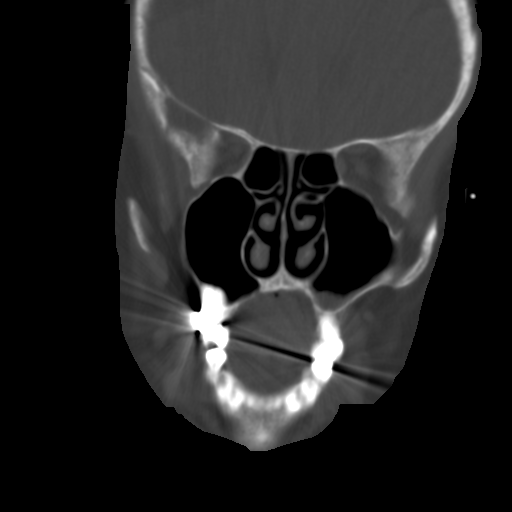
[im 8/13  bone]
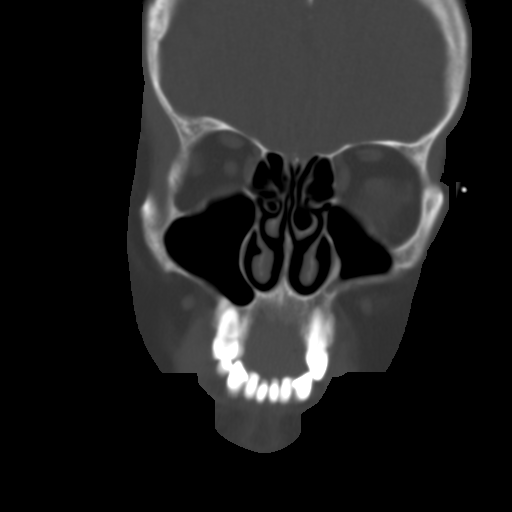
[im 9/13  bone]
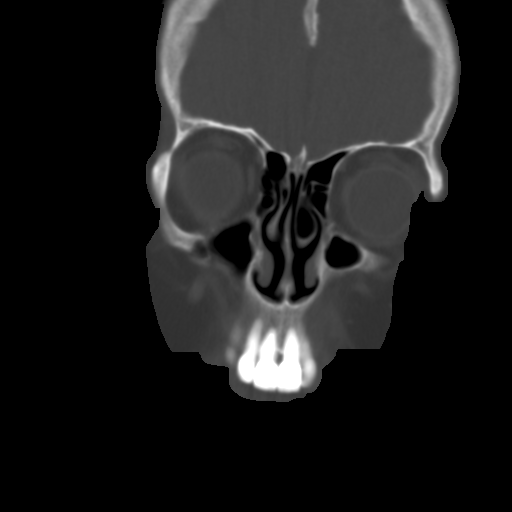
[im 10/13  brain]
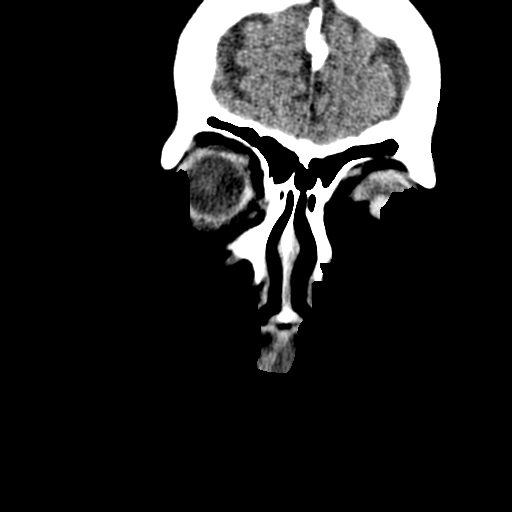
[im 10/13  bone]
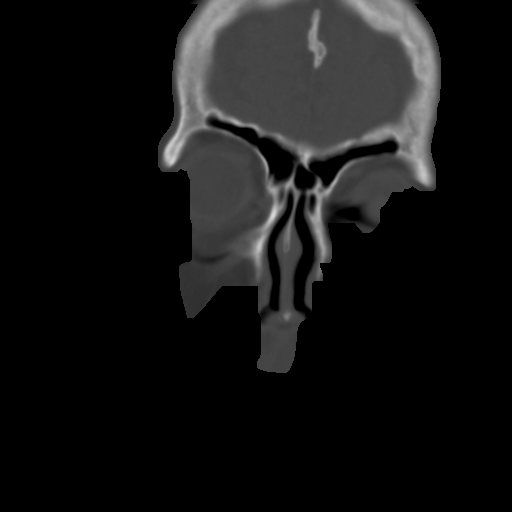
[im 11/13  bone]
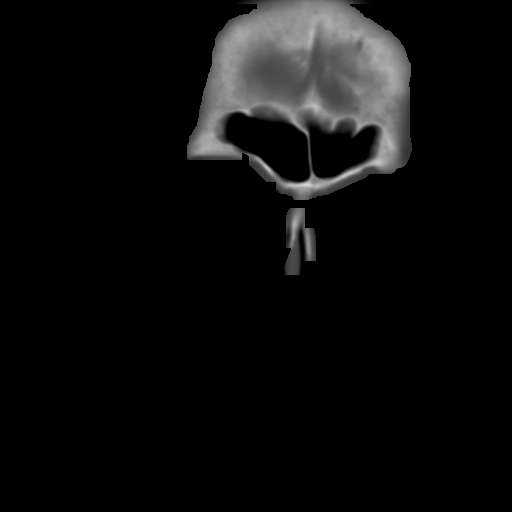
[im 12/13  bone]
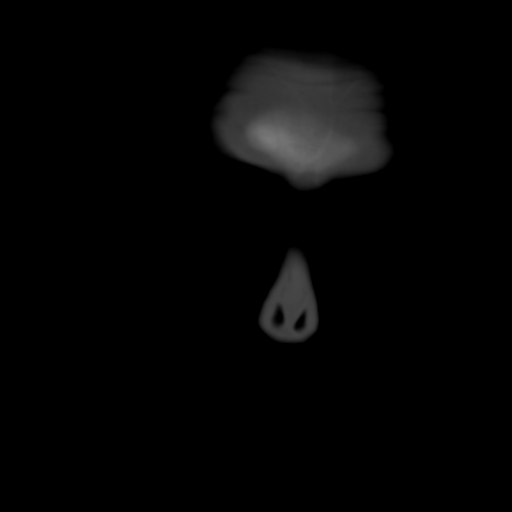

[11 of 13 positions shown; findings below may reference images not displayed]

FINDINGS: Paranasal sinuses well developed. Mild mucosal edema base of left
maxillary sinus. Remaining sinuses clear. No air-fluid level.

No soft tissue mass. Negative orbit. Limited intracranial imaging
negative
IMPRESSION: Mucosal edema base of left maxillary sinus otherwise normally
aerated paranasal sinuses.

## 2021-07-04 DIAGNOSIS — H1010 Acute atopic conjunctivitis, unspecified eye: Secondary | ICD-10-CM | POA: Diagnosis not present

## 2021-07-04 DIAGNOSIS — Z6827 Body mass index (BMI) 27.0-27.9, adult: Secondary | ICD-10-CM | POA: Diagnosis not present

## 2021-07-04 DIAGNOSIS — E663 Overweight: Secondary | ICD-10-CM | POA: Diagnosis not present

## 2021-07-17 DIAGNOSIS — H524 Presbyopia: Secondary | ICD-10-CM | POA: Diagnosis not present

## 2021-07-17 DIAGNOSIS — H25813 Combined forms of age-related cataract, bilateral: Secondary | ICD-10-CM | POA: Diagnosis not present

## 2021-07-17 DIAGNOSIS — H5203 Hypermetropia, bilateral: Secondary | ICD-10-CM | POA: Diagnosis not present

## 2021-07-17 DIAGNOSIS — H52203 Unspecified astigmatism, bilateral: Secondary | ICD-10-CM | POA: Diagnosis not present

## 2021-07-17 DIAGNOSIS — H401131 Primary open-angle glaucoma, bilateral, mild stage: Secondary | ICD-10-CM | POA: Diagnosis not present

## 2021-08-25 DIAGNOSIS — H401131 Primary open-angle glaucoma, bilateral, mild stage: Secondary | ICD-10-CM | POA: Diagnosis not present

## 2021-08-25 DIAGNOSIS — T1502XA Foreign body in cornea, left eye, initial encounter: Secondary | ICD-10-CM | POA: Diagnosis not present

## 2021-08-25 DIAGNOSIS — Y93H2 Activity, gardening and landscaping: Secondary | ICD-10-CM | POA: Diagnosis not present

## 2021-09-10 ENCOUNTER — Encounter (HOSPITAL_COMMUNITY): Payer: Self-pay | Admitting: Hematology

## 2021-09-12 ENCOUNTER — Inpatient Hospital Stay (HOSPITAL_COMMUNITY): Payer: PPO | Attending: Internal Medicine

## 2021-09-22 ENCOUNTER — Other Ambulatory Visit: Payer: Self-pay

## 2021-10-20 DIAGNOSIS — H5713 Ocular pain, bilateral: Secondary | ICD-10-CM | POA: Diagnosis not present

## 2021-10-20 DIAGNOSIS — H401131 Primary open-angle glaucoma, bilateral, mild stage: Secondary | ICD-10-CM | POA: Diagnosis not present

## 2021-10-23 DIAGNOSIS — H6122 Impacted cerumen, left ear: Secondary | ICD-10-CM | POA: Diagnosis not present

## 2021-10-23 DIAGNOSIS — H903 Sensorineural hearing loss, bilateral: Secondary | ICD-10-CM | POA: Diagnosis not present

## 2021-10-23 DIAGNOSIS — Z85819 Personal history of malignant neoplasm of unspecified site of lip, oral cavity, and pharynx: Secondary | ICD-10-CM | POA: Diagnosis not present

## 2021-10-23 DIAGNOSIS — H6982 Other specified disorders of Eustachian tube, left ear: Secondary | ICD-10-CM | POA: Diagnosis not present

## 2021-11-20 DIAGNOSIS — Z23 Encounter for immunization: Secondary | ICD-10-CM | POA: Diagnosis not present

## 2021-11-20 DIAGNOSIS — E039 Hypothyroidism, unspecified: Secondary | ICD-10-CM | POA: Diagnosis not present

## 2021-11-20 DIAGNOSIS — E538 Deficiency of other specified B group vitamins: Secondary | ICD-10-CM | POA: Diagnosis not present

## 2021-11-20 DIAGNOSIS — Z6827 Body mass index (BMI) 27.0-27.9, adult: Secondary | ICD-10-CM | POA: Diagnosis not present

## 2021-11-20 DIAGNOSIS — Z0001 Encounter for general adult medical examination with abnormal findings: Secondary | ICD-10-CM | POA: Diagnosis not present

## 2021-11-20 DIAGNOSIS — E119 Type 2 diabetes mellitus without complications: Secondary | ICD-10-CM | POA: Diagnosis not present

## 2021-11-20 DIAGNOSIS — D518 Other vitamin B12 deficiency anemias: Secondary | ICD-10-CM | POA: Diagnosis not present

## 2021-11-20 DIAGNOSIS — E559 Vitamin D deficiency, unspecified: Secondary | ICD-10-CM | POA: Diagnosis not present

## 2021-11-20 DIAGNOSIS — I700 Atherosclerosis of aorta: Secondary | ICD-10-CM | POA: Diagnosis not present

## 2021-11-20 DIAGNOSIS — Z1331 Encounter for screening for depression: Secondary | ICD-10-CM | POA: Diagnosis not present

## 2021-11-20 DIAGNOSIS — M810 Age-related osteoporosis without current pathological fracture: Secondary | ICD-10-CM | POA: Diagnosis not present

## 2021-11-20 DIAGNOSIS — C099 Malignant neoplasm of tonsil, unspecified: Secondary | ICD-10-CM | POA: Diagnosis not present

## 2021-11-20 DIAGNOSIS — I10 Essential (primary) hypertension: Secondary | ICD-10-CM | POA: Diagnosis not present

## 2021-11-20 DIAGNOSIS — R5383 Other fatigue: Secondary | ICD-10-CM | POA: Diagnosis not present

## 2021-11-20 DIAGNOSIS — H6980 Other specified disorders of Eustachian tube, unspecified ear: Secondary | ICD-10-CM | POA: Diagnosis not present

## 2021-11-20 DIAGNOSIS — M1991 Primary osteoarthritis, unspecified site: Secondary | ICD-10-CM | POA: Diagnosis not present

## 2021-11-25 DIAGNOSIS — H401131 Primary open-angle glaucoma, bilateral, mild stage: Secondary | ICD-10-CM | POA: Diagnosis not present

## 2021-11-27 DIAGNOSIS — C099 Malignant neoplasm of tonsil, unspecified: Secondary | ICD-10-CM | POA: Diagnosis not present

## 2021-12-04 DIAGNOSIS — E538 Deficiency of other specified B group vitamins: Secondary | ICD-10-CM | POA: Diagnosis not present

## 2021-12-19 ENCOUNTER — Ambulatory Visit (HOSPITAL_COMMUNITY)
Admission: RE | Admit: 2021-12-19 | Discharge: 2021-12-19 | Disposition: A | Discharge status: Home / Self Care | Payer: PPO | Source: Ambulatory Visit | Attending: Hematology | Admitting: Hematology

## 2021-12-19 ENCOUNTER — Inpatient Hospital Stay: Payer: PPO | Attending: Hematology

## 2021-12-19 ENCOUNTER — Encounter (HOSPITAL_COMMUNITY): Payer: Self-pay

## 2021-12-19 VITALS — BP 152/90 | HR 81 | Temp 98.3°F | Resp 16

## 2021-12-19 DIAGNOSIS — Z9221 Personal history of antineoplastic chemotherapy: Secondary | ICD-10-CM | POA: Insufficient documentation

## 2021-12-19 DIAGNOSIS — E039 Hypothyroidism, unspecified: Secondary | ICD-10-CM | POA: Diagnosis not present

## 2021-12-19 DIAGNOSIS — Z923 Personal history of irradiation: Secondary | ICD-10-CM | POA: Insufficient documentation

## 2021-12-19 DIAGNOSIS — Z79890 Hormone replacement therapy (postmenopausal): Secondary | ICD-10-CM | POA: Insufficient documentation

## 2021-12-19 DIAGNOSIS — Z95828 Presence of other vascular implants and grafts: Secondary | ICD-10-CM

## 2021-12-19 DIAGNOSIS — C099 Malignant neoplasm of tonsil, unspecified: Secondary | ICD-10-CM | POA: Insufficient documentation

## 2021-12-19 DIAGNOSIS — C760 Malignant neoplasm of head, face and neck: Secondary | ICD-10-CM | POA: Diagnosis not present

## 2021-12-19 DIAGNOSIS — R1310 Dysphagia, unspecified: Secondary | ICD-10-CM | POA: Insufficient documentation

## 2021-12-19 DIAGNOSIS — Z87891 Personal history of nicotine dependence: Secondary | ICD-10-CM | POA: Insufficient documentation

## 2021-12-19 LAB — COMPREHENSIVE METABOLIC PANEL
ALT: 14 U/L (ref 0–44)
AST: 18 U/L (ref 15–41)
Albumin: 4.2 g/dL (ref 3.5–5.0)
Alkaline Phosphatase: 86 U/L (ref 38–126)
Anion gap: 9 (ref 5–15)
BUN: 13 mg/dL (ref 8–23)
CO2: 27 mmol/L (ref 22–32)
Calcium: 8.8 mg/dL — ABNORMAL LOW (ref 8.9–10.3)
Chloride: 103 mmol/L (ref 98–111)
Creatinine, Ser: 0.87 mg/dL (ref 0.44–1.00)
GFR, Estimated: >60 mL/min (ref >60)
Glucose, Bld: 96 mg/dL (ref 70–99)
Potassium: 4.1 mmol/L (ref 3.5–5.1)
Sodium: 139 mmol/L (ref 135–145)
Total Bilirubin: 0.9 mg/dL (ref 0.3–1.2)
Total Protein: 7.2 g/dL (ref 6.5–8.1)

## 2021-12-19 LAB — CBC WITH DIFFERENTIAL/PLATELET
Abs Immature Granulocytes: 0.01 10*3/uL (ref 0.00–0.07)
Basophils Absolute: 0 10*3/uL (ref 0.0–0.1)
Basophils Relative: 1 %
Eosinophils Absolute: 0.1 10*3/uL (ref 0.0–0.5)
Eosinophils Relative: 2 %
HCT: 39.7 % (ref 36.0–46.0)
Hemoglobin: 13.2 g/dL (ref 12.0–15.0)
Immature Granulocytes: 0 %
Lymphocytes Relative: 23 %
Lymphs Abs: 1.1 10*3/uL (ref 0.7–4.0)
MCH: 30.2 pg (ref 26.0–34.0)
MCHC: 33.2 g/dL (ref 30.0–36.0)
MCV: 90.8 fL (ref 80.0–100.0)
Monocytes Absolute: 0.4 10*3/uL (ref 0.1–1.0)
Monocytes Relative: 8 %
Neutro Abs: 3.1 10*3/uL (ref 1.7–7.7)
Neutrophils Relative %: 66 %
Platelets: 251 10*3/uL (ref 150–400)
RBC: 4.37 MIL/uL (ref 3.87–5.11)
RDW: 13.3 % (ref 11.5–15.5)
WBC: 4.7 10*3/uL (ref 4.0–10.5)
nRBC: 0 % (ref 0.0–0.2)

## 2021-12-19 LAB — TSH: TSH: 2.786 u[IU]/mL (ref 0.350–4.500)

## 2021-12-19 MED ORDER — IOHEXOL 300 MG/ML  SOLN
100.0000 mL | Freq: Once | INTRAMUSCULAR | Status: AC | PRN
  Administered 2021-12-19: 75 mL via INTRAVENOUS

## 2021-12-19 MED ORDER — HEPARIN SOD (PORK) LOCK FLUSH 100 UNIT/ML IV SOLN
INTRAVENOUS | Status: AC
  Administered 2021-12-19: 500 [IU]
  Filled 2021-12-19: qty 5

## 2021-12-19 MED ORDER — SODIUM CHLORIDE 0.9% FLUSH
10.0000 mL | Freq: Once | INTRAVENOUS | Status: AC
  Administered 2021-12-19: 10 mL via INTRAVENOUS

## 2021-12-19 NOTE — Progress Notes (Signed)
Port flushed with good blood return noted, labs drawn. No bruising or swelling at site. Patient left accessed for CT scan and patient discharged in satisfactory condition. VVS stable with no signs or symptoms of distressed noted. 

## 2021-12-19 NOTE — Patient Instructions (Signed)
[place]  Discharge Instructions: Thank you for choosing [place] to provide your oncology and hematology care.  If you have a lab appointment with the [facility], please come in thru the Main Entrance and check in at the main information desk.  Wear comfortable clothing and clothing appropriate for easy access to any Portacath or PICC line.   We strive to give you quality time with your provider. You may need to reschedule your appointment if you arrive late (15 or more minutes).  Arriving late affects you and other patients whose appointments are after yours.  Also, if you miss three or more appointments without notifying the office, you may be dismissed from the clinic at the provider's discretion.      For prescription refill requests, have your pharmacy contact our office and allow 72 hours for refills to be completed.    Today you received the following labs drawn and port accessed for CT, return as scheduled.   To help prevent nausea and vomiting after your treatment, we encourage you to take your nausea medication as directed.  BELOW ARE SYMPTOMS THAT SHOULD BE REPORTED IMMEDIATELY: *FEVER GREATER THAN 100.4 F (38 C) OR HIGHER *CHILLS OR SWEATING *NAUSEA AND VOMITING THAT IS NOT CONTROLLED WITH YOUR NAUSEA MEDICATION *UNUSUAL SHORTNESS OF BREATH *UNUSUAL BRUISING OR BLEEDING *URINARY PROBLEMS (pain or burning when urinating, or frequent urination) *BOWEL PROBLEMS (unusual diarrhea, constipation, pain near the anus) TENDERNESS IN MOUTH AND THROAT WITH OR WITHOUT PRESENCE OF ULCERS (sore throat, sores in mouth, or a toothache) UNUSUAL RASH, SWELLING OR PAIN  UNUSUAL VAGINAL DISCHARGE OR ITCHING   Items with * indicate a potential emergency and should be followed up as soon as possible or go to the Emergency Department if any problems should occur.  Please show the CHEMOTHERAPY ALERT CARD or IMMUNOTHERAPY ALERT CARD at check-in to the  Emergency Department and triage nurse.  Should you have questions after your visit or need to cancel or reschedule your appointment, please contact [facility] [phone]  and follow the prompts.  Office hours are 8:00 a.m. to 4:30 p.m. Monday - Friday. Please note that voicemails left after 4:00 p.m. may not be returned until the following business day.  We are closed weekends and major holidays. You have access to a nurse at all times for urgent questions. Please call the main number to the clinic [phone] and follow the prompts.  For any non-urgent questions, you may also contact your provider using MyChart. We now offer e-Visits for anyone 2 and older to request care online for non-urgent symptoms. For details visit [email or web address].   Also download the MyChart app! Go to the app store, search "MyChart", open the app, select , and log in with your MyChart username and password.  Masks are optional in the cancer centers. If you would like for your care team to wear a mask while they are taking care of you, please let them know. You may have one support person who is at least 74 years old accompany you for your appointments.

## 2021-12-25 ENCOUNTER — Inpatient Hospital Stay: Payer: PPO | Admitting: Hematology

## 2021-12-25 VITALS — BP 181/83 | HR 77 | Temp 98.4°F | Resp 16

## 2021-12-25 DIAGNOSIS — C099 Malignant neoplasm of tonsil, unspecified: Secondary | ICD-10-CM

## 2021-12-25 NOTE — Patient Instructions (Addendum)
Kristi Weiss at [facility] Discharge Instructions   You were seen and examined today by Dr. Delton Coombes.  He reviewed the results of your CT scan which was normal - no evidence of cancer.   He reviewed the results of your lab work which are normal.  We will see back in 6 months. We will repeat lab work and CT scan prior to this visit.    Thank you for choosing [place] at [facility] to provide your oncology and hematology care.  To afford each patient quality time with our provider, please arrive at least 15 minutes before your scheduled appointment time.   If you have a lab appointment with the [facility] please come in thru the Main Entrance and check in at the main information desk.  You need to re-schedule your appointment should you arrive 10 or more minutes late.  We strive to give you quality time with our providers, and arriving late affects you and other patients whose appointments are after yours.  Also, if you no show three or more times for appointments you may be dismissed from the clinic at the providers discretion.     Again, thank you for choosing [facility].  Our hope is that these requests will decrease the amount of time that you wait before being seen by our physicians.       _____________________________________________________________  Should you have questions after your visit to [facility], please contact our office at [phone] and follow the prompts.  Our office hours are 8:00 a.m. and 4:30 p.m. Monday - Friday.  Please note that voicemails left after 4:00 p.m. may not be returned until the following business day.  We are closed weekends and major holidays.  You do have access to a nurse 24-7, just call the main number to the clinic [phone] and do not press any options, hold on the line and a nurse will answer the phone.    For prescription refill requests, have your pharmacy  contact our office and allow 72 hours.    Due to Covid, you will need to wear a mask upon entering the hospital. If you do not have a mask, a mask will be given to you at the Main Entrance upon arrival. For doctor visits, patients may have 1 support person age 74 or older with them. For treatment visits, patients can not have anyone with them due to social distancing guidelines and our immunocompromised population.

## 2021-12-25 NOTE — Progress Notes (Signed)
Kristi Weiss, [place] [address]   CLINIC:  Medical Oncology/Hematology  PCP:  [facility], [place] / [place] [address] [phone]   REASON FOR VISIT:  Follow-up for squamous cell carcinoma, p16 positive of the left tonsil  PRIOR THERAPY:  1. Cisplatin & Aloxi x 6 cycles from 08/16/2019 to 09/20/2019 2. Chemoradiation therapy from to 10/05/2019.  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[place])  07/26/2019 Initial Diagnosis   Squamous cell carcinoma of left tonsil ([place])   07/26/2019 Cancer Staging   Staging form: Pharynx - HPV-Mediated Oropharynx, AJCC 8th Edition - Clinical stage from 07/26/2019: Stage I (cT2, cN1, cM0, p16+) - Signed by Derek Jack, MD on 07/26/2019   08/16/2019 -  Chemotherapy   The patient had palonosetron (ALOXI) injection 0.25 mg, 0.25 mg, Intravenous,  Once, 6 of 7 cycles Administration: 0.25 mg (08/16/2019), 0.25 mg (08/23/2019), 0.25 mg (08/30/2019), 0.25 mg (09/06/2019), 0.25 mg (09/13/2019), 0.25 mg (09/20/2019) CISplatin (PLATINOL) 84 mg in sodium chloride 0.9 % 250 mL chemo infusion, 40 mg/m2 = 84 mg, Intravenous,  Once, 6 of 7 cycles Administration: 84 mg (08/16/2019), 84 mg (08/23/2019), 84 mg (08/30/2019), 84 mg (09/06/2019), 84 mg (09/13/2019), 84 mg (09/20/2019) fosaprepitant (EMEND) 150 mg in sodium chloride 0.9 % 145 mL IVPB, 150 mg, Intravenous,  Once, 6 of 7 cycles Administration: 150 mg (08/16/2019), 150 mg (08/23/2019), 150 mg (08/30/2019), 150 mg (09/06/2019), 150 mg (09/13/2019), 150 mg (09/20/2019)  for chemotherapy treatment.      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 female, seen for follow-up of tonsillar cancer on the left side.  Chronic headaches have been stable.  She was reportedly newly diagnosed with glaucoma since last visit.  She was evaluated by Dr. Benjamine Mola in August and has seen Dr. Lynnette Caffey recently.  REVIEW OF SYSTEMS:  Review of Systems  Constitutional:  Negative for appetite change and fatigue.  HENT:   Negative for trouble swallowing.   Neurological:  Positive for headaches. Negative for numbness.  All other systems reviewed and are negative.   PAST MEDICAL/SURGICAL HISTORY:  Past Medical History:  Diagnosis Date   Anxiety about health 08/23/2019   Arthritis    Atypical mole 06/30/2001   features of dys nevus- upper left back    Atypical mole 04/04/2020   mod-left upper arm -   GERD (gastroesophageal reflux disease)    Hypothyroidism    Hypothyroidism (acquired) 08/23/2019   Melanocytic nevus with features of Dysplastic Nevus 06/30/2001   Upper Left Back   Port-A-Cath in place 08/09/2019   SCC (squamous cell carcinoma) 04/12/2012   Left Forehead (Cx3,5FU)   SCC (squamous cell carcinoma) 03/16/2018   left upper cheek cx3 53f   Scoliosis    Sinusitis    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   Past Surgical History:  Procedure Laterality Date   NO PAST SURGERIES     No prior surgery     PORTACATH PLACEMENT Right 08/01/2019   Procedure: INSERTION PORT-A-CATH;  Surgeon: Aviva Signs, MD;  Location: [facility];  Service: General;  Laterality: Right;    SOCIAL HISTORY:  Social History   Socioeconomic History   Marital status: Widowed    Spouse name: Not on file   Number of children: 1   Years of education: Not on file   Highest education level: Not on file  Occupational History   Occupation: RETIRED  Tobacco Use   Smoking status: Former   Smokeless tobacco:  Never   Tobacco comments:    some in her 28s and 104s  Vaping Use   Vaping Use: Never used  Substance and Sexual Activity   Alcohol use: Yes    Comment: once every 2 years   Drug use: Never   Sexual activity: Not Currently  Other Topics Concern   Not on file  Social History Narrative   Lives at home alone   Retired   Widow   Caffeine: about 4 cups coffee, 3 glasses of tea daily   Social Determinants of Health   Financial Resource Strain: Low Risk  (07/26/2019)   Overall Financial Resource Strain (CARDIA)    Difficulty of Paying Living Expenses: Not hard at all  Food Insecurity: No Food Insecurity (08/22/2019)   Hunger Vital Sign    Worried About Running Out of Food in the Last Year: Never true    [place] in the Last Year: Never true  Transportation Needs: No Transportation Needs (08/22/2019)   PRAPARE - Hydrologist (Medical): No    Lack of Transportation (Non-Medical): No  Physical Activity: Sufficiently Active (08/22/2019)   Exercise Vital Sign    Days of Exercise per Week: 5 days    Minutes of Exercise per Session: 30 min  Stress: Stress Concern Present (07/26/2019)   [place]    Feeling of Stress : Very much  Social Connections: Not on file  Intimate Partner Violence: Not At Risk (07/26/2019)   Humiliation, Afraid, Rape, and Kick questionnaire    Fear of Current or Ex-Partner: No    Emotionally Abused: No    Physically Abused: No    Sexually Abused: No    FAMILY HISTORY:  Family History  Problem Relation Age of Onset   Lung cancer Mother        smoker   Cancer Mother        Throat   Lung cancer Father        smoker   Diabetes Father    CVA Brother    Diabetes Brother    Diabetes Maternal Grandmother    Heart attack Paternal Grandfather    Migraines Neg Hx    Headache Neg Hx     CURRENT MEDICATIONS:  Current Outpatient Medications  Medication Sig  Dispense Refill   levothyroxine (SYNTHROID) 50 MCG tablet Take 1 tablet (50 mcg total) by mouth daily. BRAND 90 tablet 2   omeprazole (PRILOSEC) 40 MG capsule Take 1 capsule (40 mg total) by mouth daily. 90 capsule 3   No current facility-administered medications for this visit.   Facility-Administered Medications Ordered in Other Visits  Medication Dose Route Frequency Provider Last Rate Last Admin   sodium chloride flush (NS) 0.9 % injection 10 mL  10 mL Intravenous PRN Alvy Bimler, Ni, MD   10 mL at 08/28/20 1541    ALLERGIES:  Allergies  Allergen Reactions   Sulfamethoxazole-Trimethoprim Other (See Comments)    Headaches   Keflex [Cephalexin]  Rash post neck   Penicillins Rash    20 years    PHYSICAL EXAM:  Performance status (ECOG): 1 - Symptomatic but completely ambulatory  Vitals:   12/25/21 1132  BP: (!) 181/83  Pulse: 77  Resp: 16  Temp: 98.4 F (36.9 C)  SpO2: 94%   Wt Readings from Last 3 Encounters:  06/19/21 182 lb 8.7 oz (82.8 kg)  03/19/21 173 lb 12.8 oz (78.8 kg)  12/05/20 180 lb 11.2 oz (82 kg)   Physical Exam Vitals reviewed.  Constitutional:      Appearance: Normal appearance.  HENT:     Mouth/Throat:     Comments: Erythema on soft palate Cardiovascular:     Rate and Rhythm: Normal rate and regular rhythm.     Pulses: Normal pulses.     Heart sounds: Normal heart sounds.  Pulmonary:     Effort: Pulmonary effort is normal.     Breath sounds: Normal breath sounds.  Lymphadenopathy:     Cervical: No cervical adenopathy.     Right cervical: No superficial, deep or posterior cervical adenopathy.    Left cervical: No superficial, deep or posterior cervical adenopathy.  Neurological:     General: No focal deficit present.     Mental Status: She is alert and oriented to person, place, and time.  Psychiatric:        Mood and Affect: Mood normal.        Behavior: Behavior normal.      LABORATORY DATA:  I have reviewed the labs as listed.      Latest Ref Rng & Units 12/19/2021   10:02 AM 06/09/2021    8:25 AM 03/06/2021    3:12 PM  CBC  WBC 4.0 - 10.5 K/uL 4.7  4.9  5.8   Hemoglobin 12.0 - 15.0 g/dL 13.2  12.5  12.5   Hematocrit 36.0 - 46.0 % 39.7  38.6  38.1   Platelets 150 - 400 K/uL 251  222  269       Latest Ref Rng & Units 12/19/2021   10:02 AM 06/09/2021    8:25 AM 03/06/2021    3:12 PM  CMP  Glucose 70 - 99 mg/dL 96  98  94   BUN 8 - 23 mg/dL '13  14  14   '$ Creatinine 0.44 - 1.00 mg/dL 0.87  0.94  1.15   Sodium 135 - 145 mmol/L 139  138  140   Potassium 3.5 - 5.1 mmol/L 4.1  3.8  3.8   Chloride 98 - 111 mmol/L 103  103  103   CO2 22 - 32 mmol/L '27  27  26   '$ Calcium 8.9 - 10.3 mg/dL 8.8  8.7  8.9   Total Protein 6.5 - 8.1 g/dL 7.2  6.5  6.8   Total Bilirubin 0.3 - 1.2 mg/dL 0.9  0.8  0.4   Alkaline Phos 38 - 126 U/L 86  73  79   AST 15 - 41 U/L '18  16  17   '$ ALT 0 - 44 U/L '14  15  13     '$ DIAGNOSTIC IMAGING:  I have independently reviewed the scans and discussed with the patient. CT SOFT TISSUE NECK W CONTRAST  Result Date: 12/22/2021 CLINICAL DATA:  Head and neck cancer follow-up EXAM: CT NECK WITH CONTRAST TECHNIQUE: Multidetector CT imaging of the neck was performed using the standard protocol following the bolus administration of intravenous contrast. RADIATION DOSE REDUCTION: This exam was performed according to  the departmental dose-optimization program which includes automated exposure control, adjustment of the mA and/or kV according to patient size and/or use of iterative reconstruction technique. CONTRAST:  4m OMNIPAQUE IOHEXOL 300 MG/ML  SOLN COMPARISON:  06/09/21 neck CT, 11/29/20 neck CT FINDINGS: Pharynx and larynx: Posttreatment changes in the left aspect of the oropharynx without new findings to suggest recurrent disease. No fluid collection. Assessment of the oral cavity is limited due to streak artifact from dental amalgam. Salivary glands: The left parotid and submandibular glands demonstrate asymmetric  fatty infiltration compatible postradiation change. Thyroid: Redemonstrated is an exophytic left posterior thyroid nodule which has been previously evaluated with ultrasound and biopsy. Lymph nodes: None enlarged or abnormal density. Vascular: Negative. Limited intracranial: Negative. Visualized orbits: Negative. Mastoids and visualized paranasal sinuses: Clear. Skeleton: No acute or aggressive process. Upper chest: Negative.  Right chest wall port in place. Other: None. IMPRESSION: No evidence of recurrent or nodal metastatic disease. Electronically Signed   By: HMarin RobertsM.D.   On: 12/22/2021 09:30     ASSESSMENT:  1.  Squamous cell carcinoma, p16 positive of the left tonsil: - 06/29/2019 Biopsy A. LYMPH NODE, LEFT NECK, NEEDLE CORE BIOPSY:  - Squamous cell carcinoma, p16 positive.  COMMENT:  The carcinoma is positive with p16, p40, cytokeratin 5/6 and p63 consistent with squamous cell carcinoma.  The carcinoma is negative with Epstein-Barr virus (EBV), TTF-1 and Napsin-A.  -PET scan on 07/18/2019 showed left level 2A lymph node, 1 cm.  Small rounded lymph node 8 mm at level 2B on the left side.  Intense palatine tonsillar uptake with asymmetric fullness of tonsillar tissue and symmetrical FDG activity with masslike changes on the left side measuring 2.2 x 1.7 cm compared to the right.  SUV 16.2.  Small focus of increased activity in the right neck adjacent to or within the small nodule in the right hemithyroid. -An ultrasound of the thyroid gland showed heterogeneous thyroid with no masses. -She was evaluated by Dr.Yanagihara. -XRT with weekly cisplatin started on 08/16/2019.  Week 6 of cisplatin on 09/20/2019. -XRT completed on 10/05/2019. -PET CT scan on 12/25/2019 showed asymmetric hypermetabolic activity in the right lingual tonsil, similar to pretreatment PET scan.  Hypermetabolic right level 2 lymph node measures 5 mm with SUV 5.5.  A second lymph node measures 5 mm with SUV 4.3.  Complete  resolution of the left tonsil, and left level 2 lymph nodes. -PET scan on 04/01/2020 with no hypermetabolic metastatic disease.  Previously seen small hypermetabolic right level 2 neck lymph nodes have resolved.  No tonsillar hypermetabolism.  2.  Thyroid nodules: - Left thyroid nodule biopsy on 05/08/2020 consistent with benign follicular nodule, Bethesda category type II.  Right thyroid FNA was Bethesda category 1.   PLAN:  1.  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  Postradiation skin thickening on the left side of the neck and submandibular region is stable.  No new palpable masses or adenopathy. - CT soft tissue neck on 12/19/2021 does not show any evidence of recurrence or nodal metastasis. - RTC 6 months with repeat labs and scan.     2.  Hypothyroidism: - TSH is 2.7.  Continue Synthroid daily.   3.  Difficulty swallowing: - She has some difficulty swallowing dry foods.  However she is maintaining her weight.      Orders placed this encounter:  Orders Placed This  Encounter  Procedures   CT SOFT TISSUE NECK W CONTRAST   CBC with Differential   Comprehensive metabolic panel   TSH     Derek Jack, MD [place] [phone]   I, Thana Ates, am acting as a scribe for Dr. Derek Jack.  I, Derek Jack MD, have reviewed the above documentation for accuracy and completeness, and I agree with the above.

## 2021-12-26 ENCOUNTER — Other Ambulatory Visit: Payer: Self-pay

## 2022-01-10 ENCOUNTER — Encounter (INDEPENDENT_AMBULATORY_CARE_PROVIDER_SITE_OTHER): Payer: Self-pay | Admitting: Gastroenterology

## 2022-01-12 DIAGNOSIS — E538 Deficiency of other specified B group vitamins: Secondary | ICD-10-CM | POA: Diagnosis not present

## 2022-01-12 DIAGNOSIS — E039 Hypothyroidism, unspecified: Secondary | ICD-10-CM | POA: Diagnosis not present

## 2022-01-12 DIAGNOSIS — E785 Hyperlipidemia, unspecified: Secondary | ICD-10-CM | POA: Diagnosis not present

## 2022-01-12 DIAGNOSIS — Z6827 Body mass index (BMI) 27.0-27.9, adult: Secondary | ICD-10-CM | POA: Diagnosis not present

## 2022-01-26 IMAGING — CT CT NECK W/ CM
3 of 4 series · 14 of 33 positions shown, 17 images · IV contrast (Omnipaque or Isovue)
Comparison: Soft tissue ultrasound 05/25/2019

CLINICAL DATA: Left submandibular lump.

EXAM:
CT NECK WITH CONTRAST
TECHNIQUE: Multidetector CT imaging of the neck was performed using the
standard protocol following the bolus administration of intravenous
contrast.
CONTRAST:  75mL OMNIPAQUE IOHEXOL 300 MG/ML  SOLN

[Series 4: cor neck · coronal · 0.45mm/px · 3 of 107 slices shown]
[im 28/107  bone]
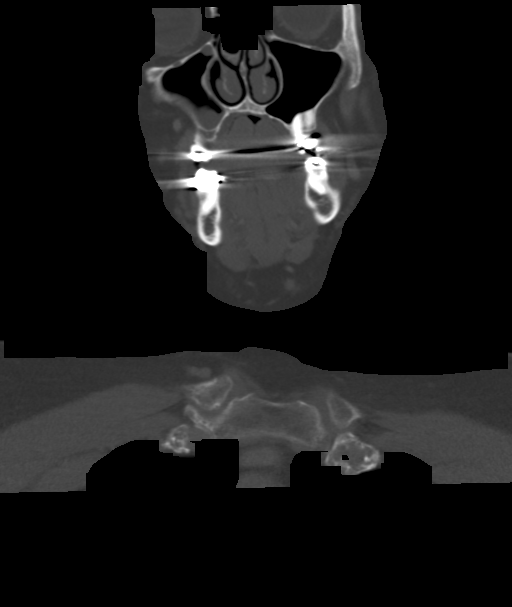
[im 45/107  bone]
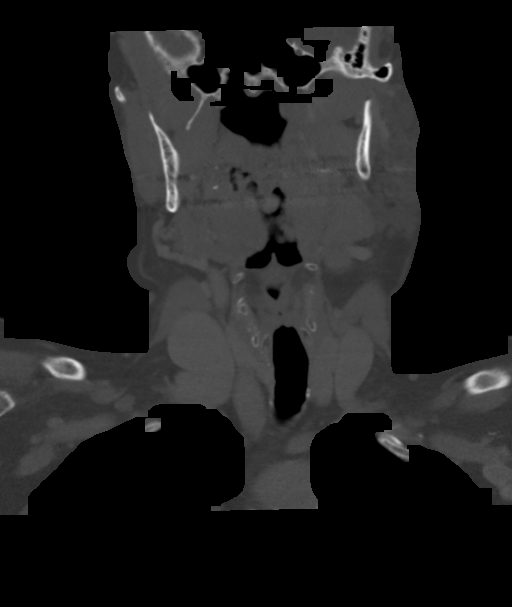
[im 62/107  bone]
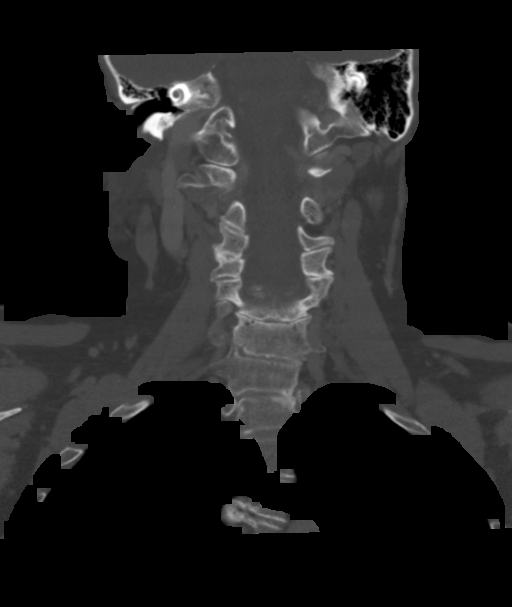

[Series 5: sag neck · sagittal · 0.49mm/px · 5 of 101 slices shown, 6 images]
[im 34/101  bone]
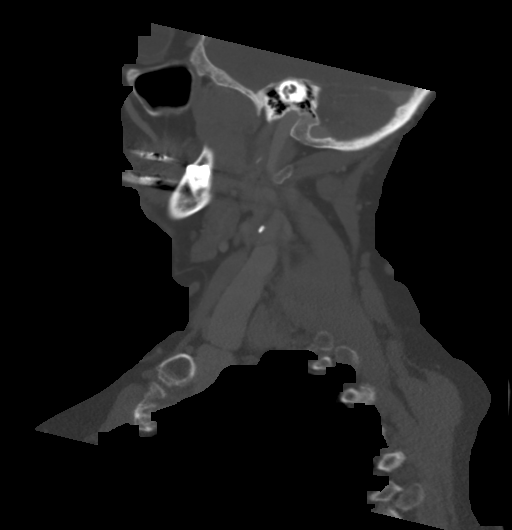
[im 42/101  bone]
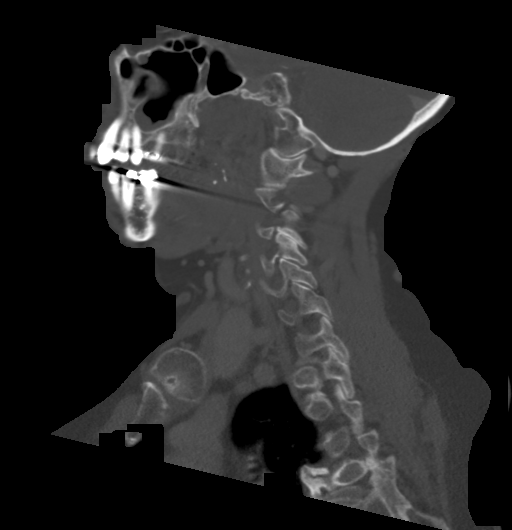
[im 51/101  soft-tissue]
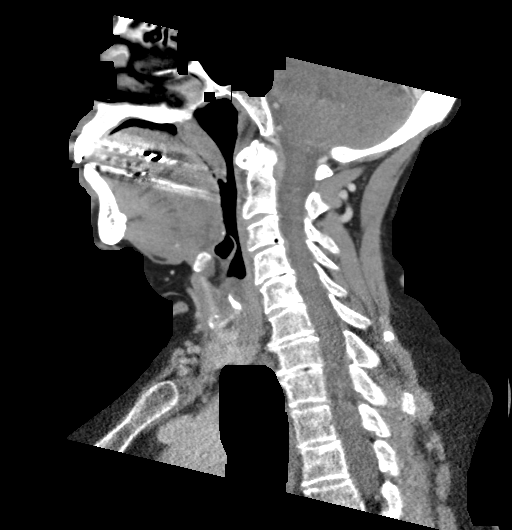
[im 51/101  bone]
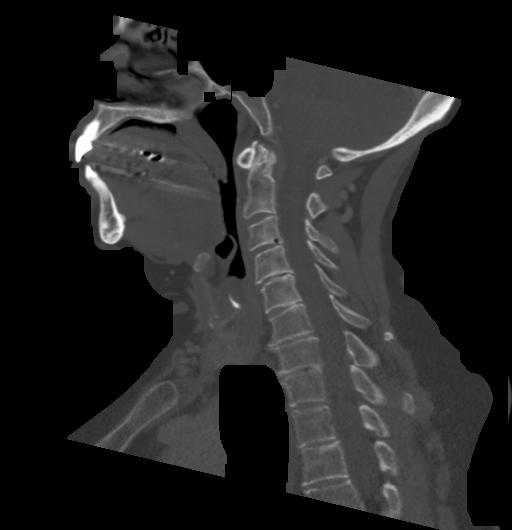
[im 59/101  bone]
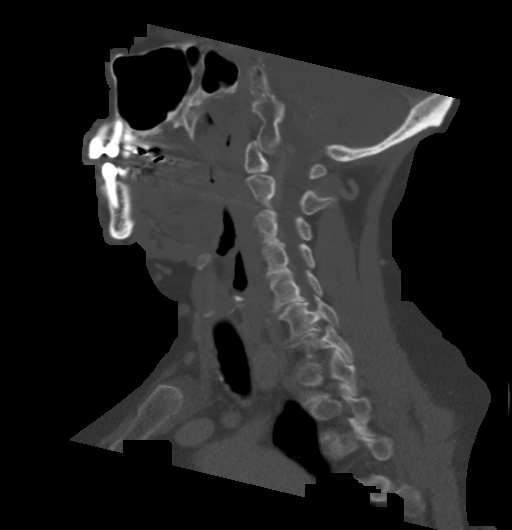
[im 67/101  bone]
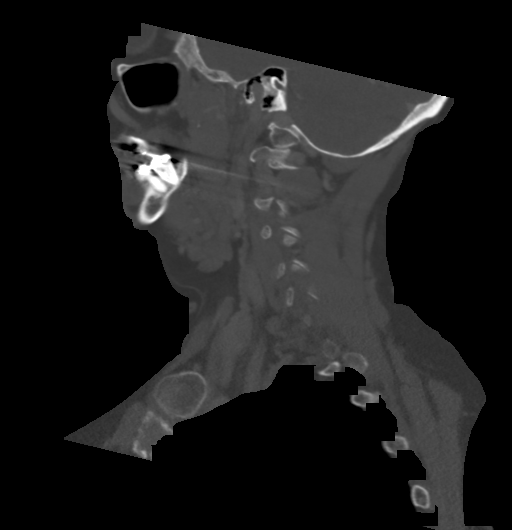

[Series 6: ax oropharynx · axial · 0.39mm/px · z∈[+44,+227]mm · 6 of 135 slices shown, 8 images]
[im 20/135  soft-tissue]
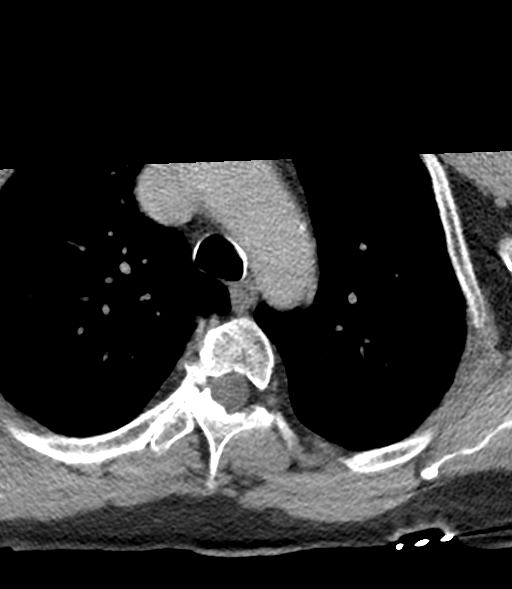
[im 20/135  bone]
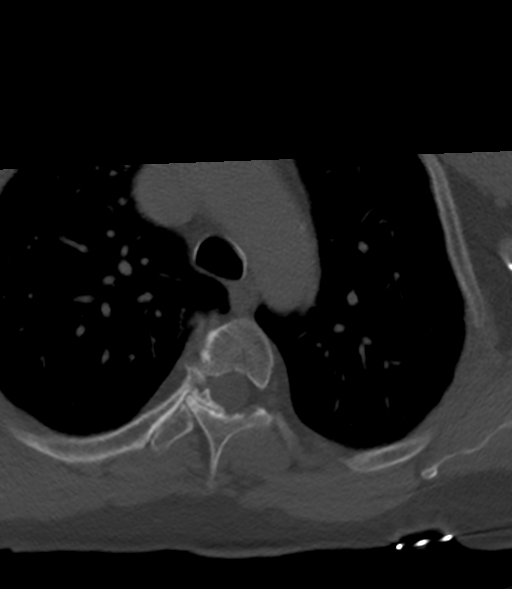
[im 39/135  bone]
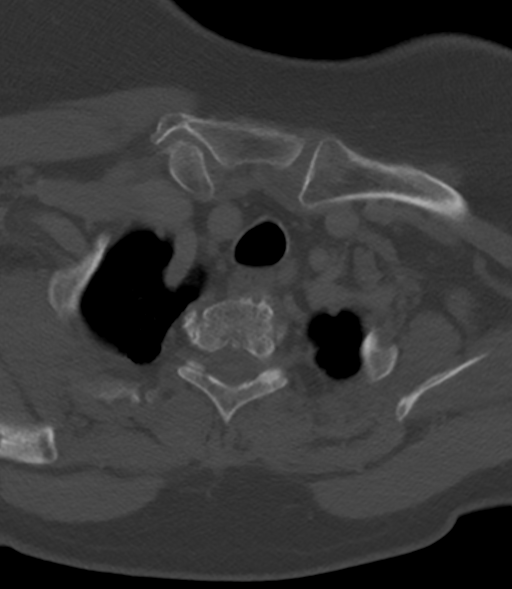
[im 58/135  bone]
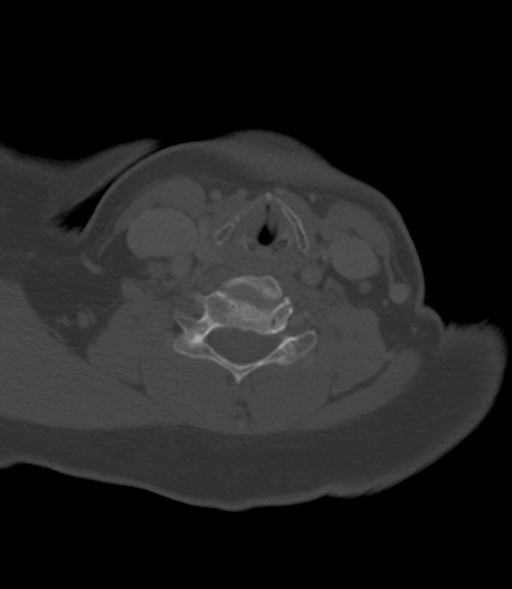
[im 77/135  bone]
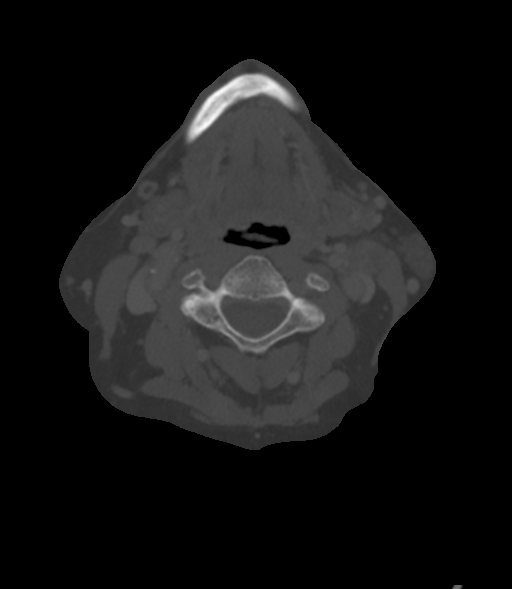
[im 96/135  soft-tissue]
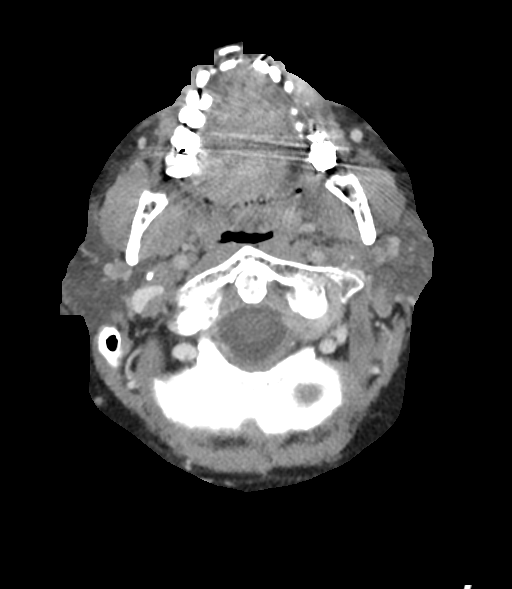
[im 96/135  bone]
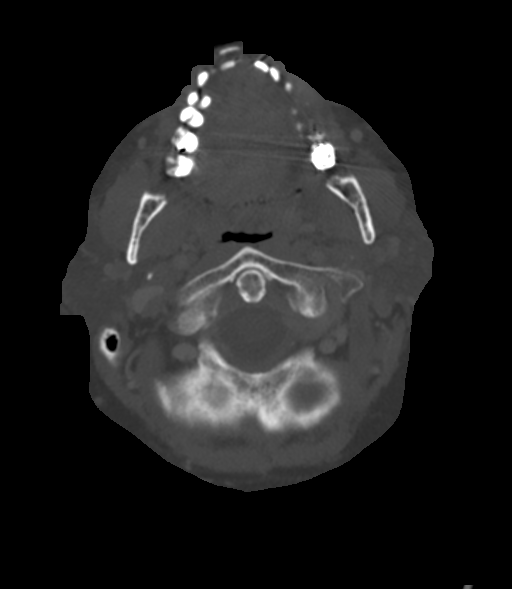
[im 115/135  bone]
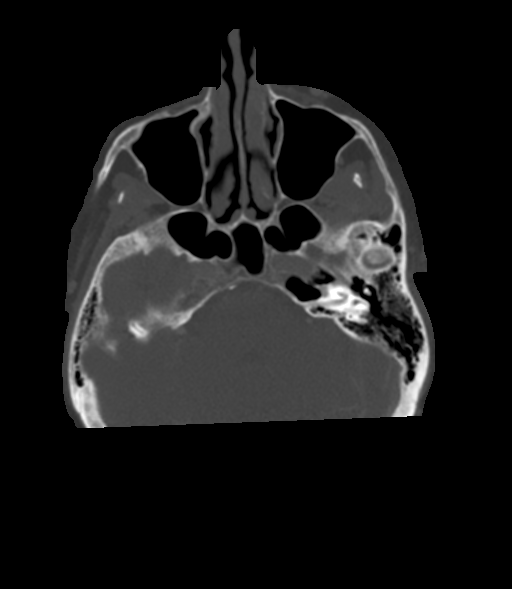

[14 of 33 positions shown; findings below may reference images not displayed]

FINDINGS: PHARYNX AND LARYNX: The nasopharynx, oropharynx and larynx are
normal. Visible portions of the oral cavity, tongue base and floor
of mouth are normal. Normal epiglottis, vallecula and pyriform
sinuses. The larynx is normal. No retropharyngeal abscess, effusion
or lymphadenopathy.

SALIVARY GLANDS: Left submandibular gland is enlarged. The parotid
glands are normal.

THYROID: There is an exophytic focus arising from the posterior
aspect of the left thyroid lobe, measuring approximately 1.3 cm.
This extends inferior to the thyroid isthmus and is at the level of
the tracheoesophageal groove.

LYMPH NODES: At left level 2A, directly posterior to the
submandibular gland and deep to the parotid gland, there is an
enlarged lymph node measuring 1.6 cm. No other enlarged or abnormal
density nodes.

VASCULAR: Calcific aortic atherosclerosis. Mild calcification of the
carotid bifurcations. Major vessels are patent.

LIMITED INTRACRANIAL: Normal.

VISUALIZED ORBITS: Normal.

MASTOIDS AND VISUALIZED PARANASAL SINUSES: No fluid levels or
advanced mucosal thickening. No mastoid effusion.

SKELETON: No bony spinal canal stenosis. No lytic or blastic
lesions.

UPPER CHEST: Clear.

OTHER: None.
IMPRESSION: 1. Round, enlarged lymph node at left level 2A, concerning for
metastasis from an unknown primary. Histologic sampling should be
considered.
2. Exophytic focus arising from the posterior aspect of the left
thyroid lobe, directly posterior to the thyroid isthmus and is at
the level of the tracheoesophageal groove. This could indicate a
parathyroid adenoma. Consider correlation with relevant laboratory
values. Due to its deep location, this area may be poorly visualized
by ultrasound.
3. Clear visualized paranasal sinuses.
4. Aortic Atherosclerosis ([id number]).

## 2022-02-08 IMAGING — US US BIOPSY LYMPH NODE
1 series · 12 of 12 positions shown · non-contrast
Comparison: none

INDICATION: 74-year-old female with pathologic left submandibular lymph node.

[Series 1: us core biopsy (lymph nodes) · 12 of 12 slices shown]
[im 1/12]
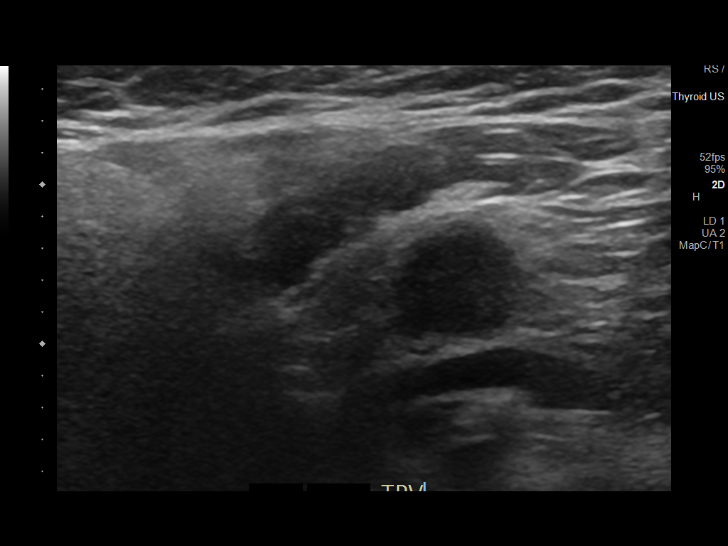
[im 2/12]
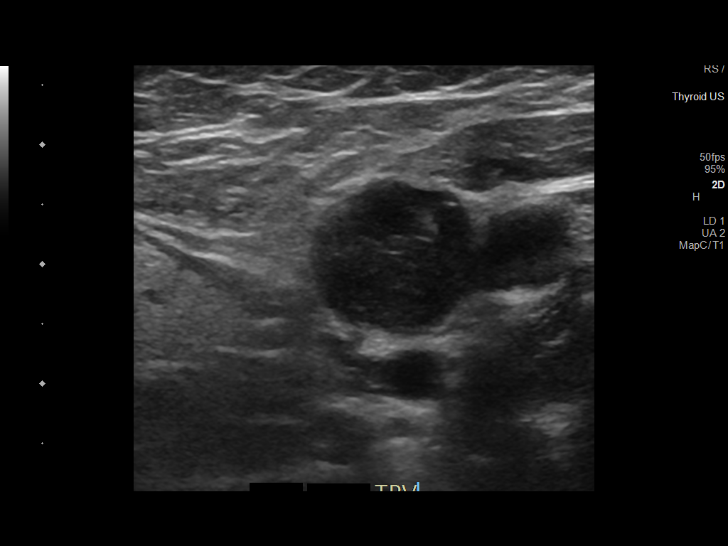
[im 3/12]
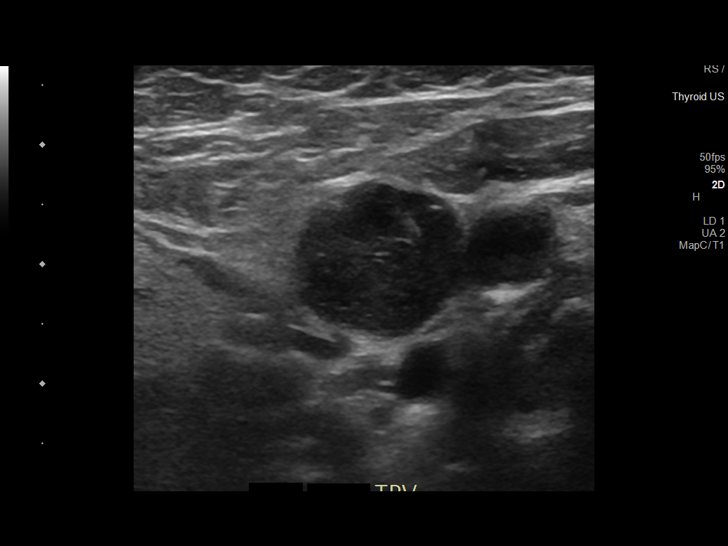
[im 4/12]
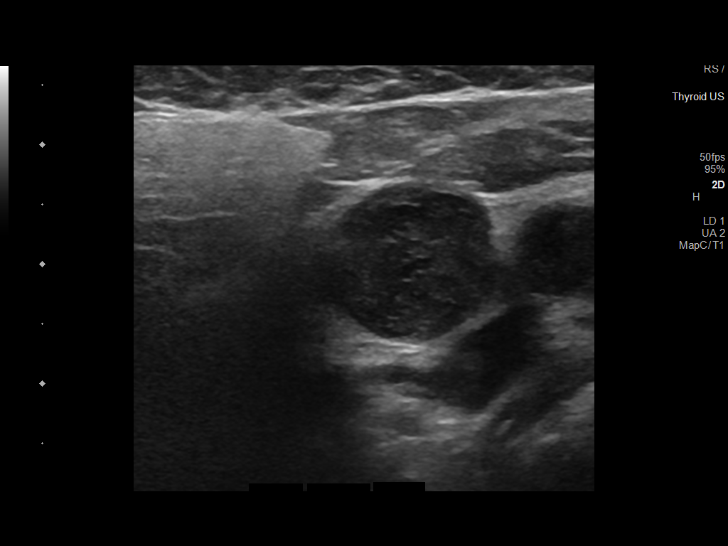
[im 5/12]
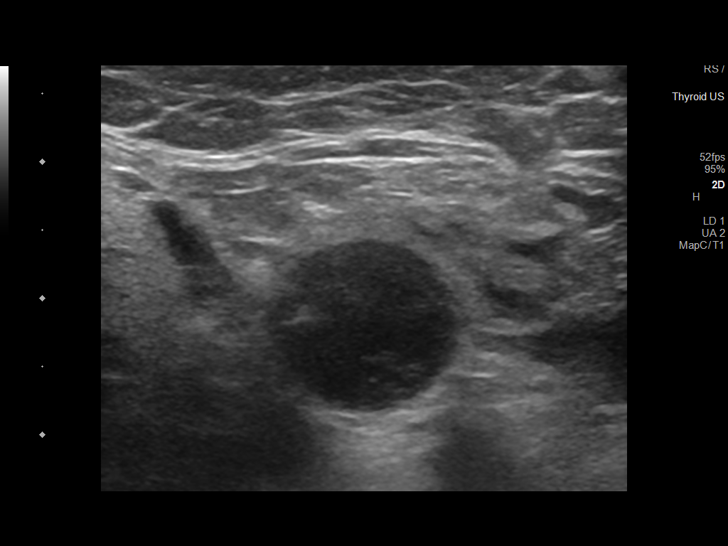
[im 6/12]
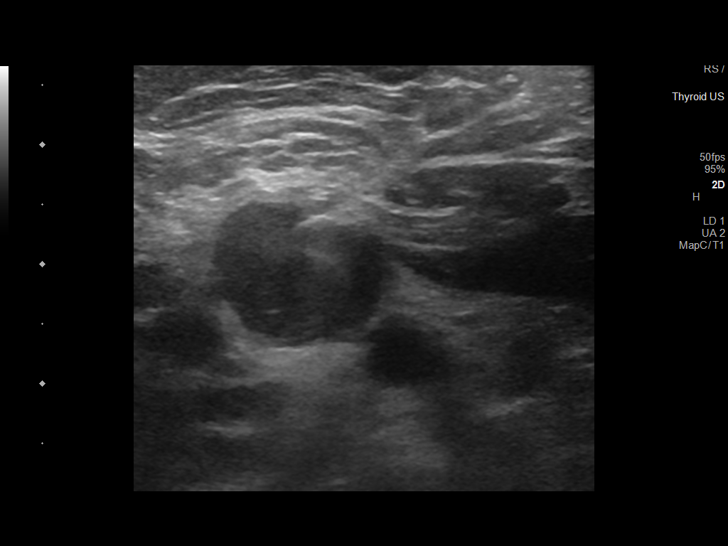
[im 7/12]
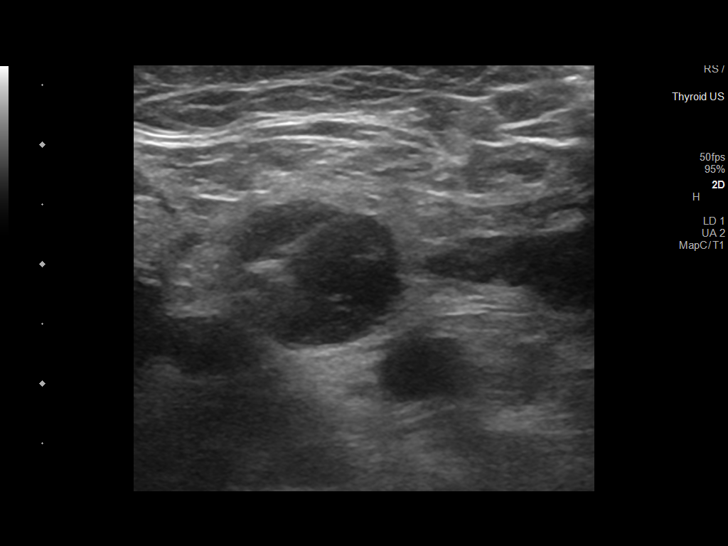
[im 8/12]
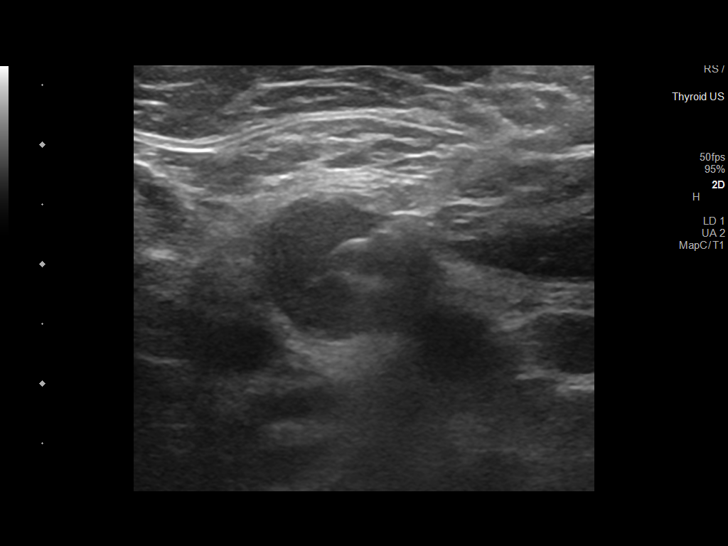
[im 9/12]
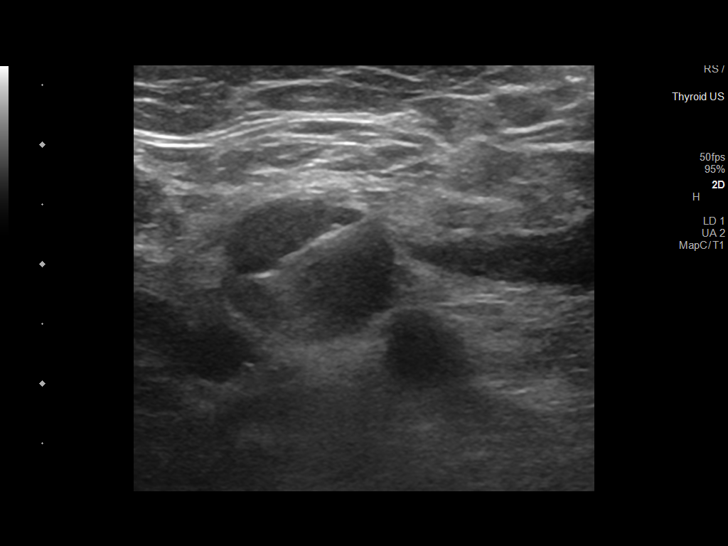
[im 10/12]
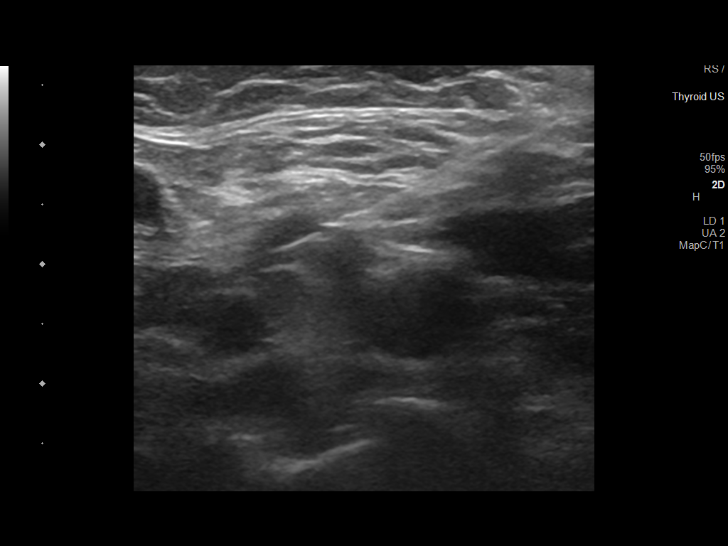
[im 11/12]
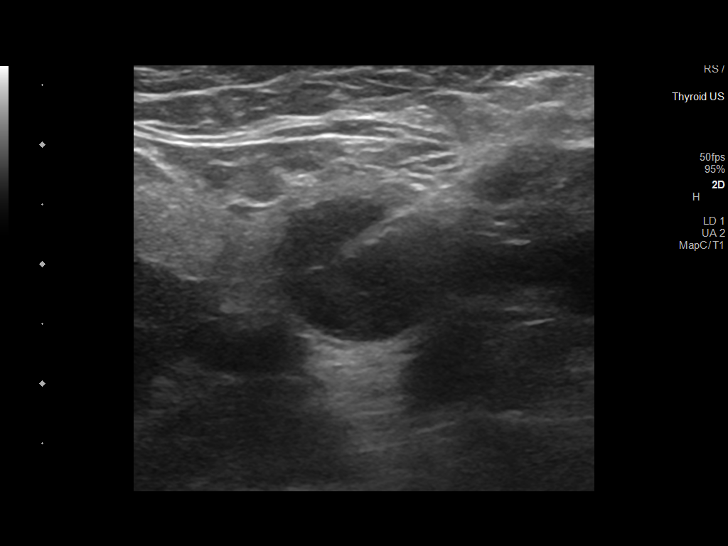
[im 12/12]
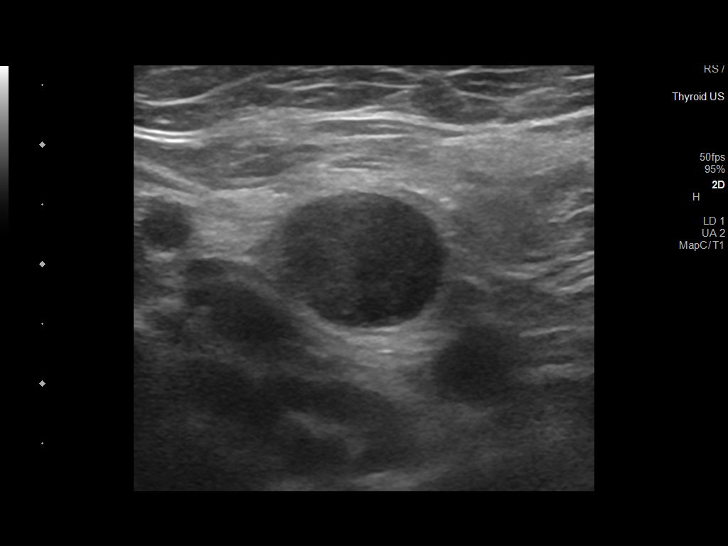

[12 of 12 positions shown; findings below may reference images not displayed]

EXAM:
ULTRASOUND OF THE LYMPH NODES

MEDICATIONS:
None.

ANESTHESIA/SEDATION:
Moderate (conscious) sedation was employed during this procedure. A
total of Versed 1 mg and Fentanyl 25 mcg was administered
intravenously.

Moderate Sedation Time: 10 minutes. The patient's level of
consciousness and vital signs were monitored continuously by
radiology nursing throughout the procedure under my direct
supervision.

FLUOROSCOPY TIME:  None.

COMPLICATIONS:
None immediate.

PROCEDURE:
Informed written consent was obtained from the patient after a
thorough discussion of the procedural risks, benefits and
alternatives. All questions were addressed. A timeout was performed
prior to the initiation of the procedure.

Ultrasound was used to interrogate the left submandibular space. An
abnormal rounded hypoechoic 1.4 x 1.3 cm lymph node is identified
interposition between the jugular vein and carotid artery. A
suitable skin entry site was selected and marked. The overlying skin
was sterilely prepped and draped in the standard fashion using
chlorhexidine skin prep. Local anesthesia was attained by
infiltration with 1% lidocaine. A small dermatotomy was made. Under
real-time ultrasound guidance, multiple 18 gauge core biopsies were
carefully obtained using the [employer]man[employer] Atsbha automated biopsy device.
Care was taken to make sure that the internal jugular vein and
carotid artery were protected at all times.

Biopsy specimens were placed in saline and delivered to pathology
for further analysis. Post biopsy ultrasound imaging demonstrates no
evidence of immediate complication. The patient tolerated the
procedure well.
IMPRESSION: Successful ultrasound-guided core biopsy of left submandibular space
lymphadenopathy.

## 2022-02-11 DIAGNOSIS — E538 Deficiency of other specified B group vitamins: Secondary | ICD-10-CM | POA: Diagnosis not present

## 2022-03-03 DIAGNOSIS — E663 Overweight: Secondary | ICD-10-CM | POA: Diagnosis not present

## 2022-03-03 DIAGNOSIS — E538 Deficiency of other specified B group vitamins: Secondary | ICD-10-CM | POA: Diagnosis not present

## 2022-03-03 DIAGNOSIS — E039 Hypothyroidism, unspecified: Secondary | ICD-10-CM | POA: Diagnosis not present

## 2022-03-03 DIAGNOSIS — Z6828 Body mass index (BMI) 28.0-28.9, adult: Secondary | ICD-10-CM | POA: Diagnosis not present

## 2022-03-26 DIAGNOSIS — M5416 Radiculopathy, lumbar region: Secondary | ICD-10-CM | POA: Diagnosis not present

## 2022-03-26 DIAGNOSIS — E119 Type 2 diabetes mellitus without complications: Secondary | ICD-10-CM | POA: Diagnosis not present

## 2022-03-26 DIAGNOSIS — C099 Malignant neoplasm of tonsil, unspecified: Secondary | ICD-10-CM | POA: Diagnosis not present

## 2022-03-26 DIAGNOSIS — M461 Sacroiliitis, not elsewhere classified: Secondary | ICD-10-CM | POA: Diagnosis not present

## 2022-03-26 DIAGNOSIS — Z6828 Body mass index (BMI) 28.0-28.9, adult: Secondary | ICD-10-CM | POA: Diagnosis not present

## 2022-03-26 DIAGNOSIS — I10 Essential (primary) hypertension: Secondary | ICD-10-CM | POA: Diagnosis not present

## 2022-03-26 DIAGNOSIS — E663 Overweight: Secondary | ICD-10-CM | POA: Diagnosis not present

## 2022-03-26 DIAGNOSIS — I700 Atherosclerosis of aorta: Secondary | ICD-10-CM | POA: Diagnosis not present

## 2022-03-26 DIAGNOSIS — N390 Urinary tract infection, site not specified: Secondary | ICD-10-CM | POA: Diagnosis not present

## 2022-03-27 ENCOUNTER — Other Ambulatory Visit (HOSPITAL_COMMUNITY): Payer: Self-pay | Admitting: Internal Medicine

## 2022-03-27 ENCOUNTER — Inpatient Hospital Stay: Payer: PPO | Attending: Hematology

## 2022-03-27 VITALS — BP 98/86 | HR 92 | Temp 98.7°F | Resp 18

## 2022-03-27 DIAGNOSIS — Z1231 Encounter for screening mammogram for malignant neoplasm of breast: Secondary | ICD-10-CM

## 2022-03-27 DIAGNOSIS — Z95828 Presence of other vascular implants and grafts: Secondary | ICD-10-CM

## 2022-03-27 DIAGNOSIS — Z452 Encounter for adjustment and management of vascular access device: Secondary | ICD-10-CM | POA: Insufficient documentation

## 2022-03-27 DIAGNOSIS — C099 Malignant neoplasm of tonsil, unspecified: Secondary | ICD-10-CM | POA: Insufficient documentation

## 2022-03-27 MED ORDER — HEPARIN SOD (PORK) LOCK FLUSH 100 UNIT/ML IV SOLN
500.0000 [IU] | Freq: Once | INTRAVENOUS | Status: AC
  Administered 2022-03-27: 500 [IU] via INTRAVENOUS

## 2022-03-27 MED ORDER — SODIUM CHLORIDE 0.9% FLUSH
10.0000 mL | INTRAVENOUS | Status: DC | PRN
  Administered 2022-03-27: 10 mL via INTRAVENOUS

## 2022-03-27 NOTE — Patient Instructions (Signed)
[place]  Discharge Instructions: Thank you for choosing [facility] to provide your oncology and hematology care.  If you have a lab appointment with the [facility], please come in thru the Main Entrance and check in at the main information desk.  Wear comfortable clothing and clothing appropriate for easy access to any Portacath or PICC line.   We strive to give you quality time with your provider. You may need to reschedule your appointment if you arrive late (15 or more minutes).  Arriving late affects you and other patients whose appointments are after yours.  Also, if you miss three or more appointments without notifying the office, you may be dismissed from the clinic at the provider's discretion.      For prescription refill requests, have your pharmacy contact our office and allow 72 hours for refills to be completed.    You had your port flushed today.   To help prevent nausea and vomiting after your treatment, we encourage you to take your nausea medication as directed.  BELOW ARE SYMPTOMS THAT SHOULD BE REPORTED IMMEDIATELY: *FEVER GREATER THAN 100.4 F (38 C) OR HIGHER *CHILLS OR SWEATING *NAUSEA AND VOMITING THAT IS NOT CONTROLLED WITH YOUR NAUSEA MEDICATION *UNUSUAL SHORTNESS OF BREATH *UNUSUAL BRUISING OR BLEEDING *URINARY PROBLEMS (pain or burning when urinating, or frequent urination) *BOWEL PROBLEMS (unusual diarrhea, constipation, pain near the anus) TENDERNESS IN MOUTH AND THROAT WITH OR WITHOUT PRESENCE OF ULCERS (sore throat, sores in mouth, or a toothache) UNUSUAL RASH, SWELLING OR PAIN  UNUSUAL VAGINAL DISCHARGE OR ITCHING   Items with * indicate a potential emergency and should be followed up as soon as possible or go to the Emergency Department if any problems should occur.  Please show the CHEMOTHERAPY ALERT CARD or IMMUNOTHERAPY ALERT CARD at check-in to the Emergency Department and triage nurse.  Should you have  questions after your visit or need to cancel or reschedule your appointment, please contact [facility] [phone]  and follow the prompts.  Office hours are 8:00 a.m. to 4:30 p.m. Monday - Friday. Please note that voicemails left after 4:00 p.m. may not be returned until the following business day.  We are closed weekends and major holidays. You have access to a nurse at all times for urgent questions. Please call the main number to the clinic [phone] and follow the prompts.  For any non-urgent questions, you may also contact your provider using MyChart. We now offer e-Visits for anyone 31 and older to request care online for non-urgent symptoms. For details visit [email or web address].   Also download the MyChart app! Go to the app store, search "MyChart", open the app, select [facility], and log in with your MyChart username and password.

## 2022-03-27 NOTE — Progress Notes (Signed)
Kristi Weiss presented for Portacath access and flush.  Portacath located right chest wall accessed with  H 20 needle.  Good blood return present. Portacath flushed with 90m NS and 500U/559mHeparin and needle removed intact.  Procedure tolerated well and without incident.

## 2022-04-01 ENCOUNTER — Ambulatory Visit (HOSPITAL_COMMUNITY)
Admission: RE | Admit: 2022-04-01 | Discharge: 2022-04-01 | Disposition: A | Discharge status: Home / Self Care | Payer: PPO | Source: Ambulatory Visit | Attending: Internal Medicine | Admitting: Internal Medicine

## 2022-04-01 DIAGNOSIS — Z1231 Encounter for screening mammogram for malignant neoplasm of breast: Secondary | ICD-10-CM

## 2022-04-06 DIAGNOSIS — N390 Urinary tract infection, site not specified: Secondary | ICD-10-CM | POA: Diagnosis not present

## 2022-04-06 DIAGNOSIS — E538 Deficiency of other specified B group vitamins: Secondary | ICD-10-CM | POA: Diagnosis not present

## 2022-04-22 DIAGNOSIS — H903 Sensorineural hearing loss, bilateral: Secondary | ICD-10-CM | POA: Diagnosis not present

## 2022-04-22 DIAGNOSIS — H6122 Impacted cerumen, left ear: Secondary | ICD-10-CM | POA: Diagnosis not present

## 2022-04-22 DIAGNOSIS — Z85819 Personal history of malignant neoplasm of unspecified site of lip, oral cavity, and pharynx: Secondary | ICD-10-CM | POA: Diagnosis not present

## 2022-04-23 DIAGNOSIS — H40003 Preglaucoma, unspecified, bilateral: Secondary | ICD-10-CM | POA: Diagnosis not present

## 2022-05-11 DIAGNOSIS — E538 Deficiency of other specified B group vitamins: Secondary | ICD-10-CM | POA: Diagnosis not present

## 2022-06-10 DIAGNOSIS — E538 Deficiency of other specified B group vitamins: Secondary | ICD-10-CM | POA: Diagnosis not present

## 2022-06-16 ENCOUNTER — Encounter (INDEPENDENT_AMBULATORY_CARE_PROVIDER_SITE_OTHER): Payer: Self-pay | Admitting: Gastroenterology

## 2022-06-16 ENCOUNTER — Ambulatory Visit (INDEPENDENT_AMBULATORY_CARE_PROVIDER_SITE_OTHER): Payer: PPO | Admitting: Gastroenterology

## 2022-06-16 ENCOUNTER — Telehealth (INDEPENDENT_AMBULATORY_CARE_PROVIDER_SITE_OTHER): Payer: Self-pay

## 2022-06-16 ENCOUNTER — Telehealth (INDEPENDENT_AMBULATORY_CARE_PROVIDER_SITE_OTHER): Payer: Self-pay | Admitting: Gastroenterology

## 2022-06-16 VITALS — BP 133/66 | HR 74 | Temp 98.6°F | Ht 67.0 in | Wt 188.4 lb

## 2022-06-16 DIAGNOSIS — G8929 Other chronic pain: Secondary | ICD-10-CM

## 2022-06-16 DIAGNOSIS — R1013 Epigastric pain: Secondary | ICD-10-CM | POA: Diagnosis not present

## 2022-06-16 DIAGNOSIS — K219 Gastro-esophageal reflux disease without esophagitis: Secondary | ICD-10-CM

## 2022-06-16 MED ORDER — OMEPRAZOLE 40 MG PO CPDR: 40.0000 mg | DELAYED_RELEASE_CAPSULE | Freq: Every day | ORAL | 3 refills | Status: DC

## 2022-06-16 NOTE — Progress Notes (Addendum)
Referring Provider: Elfredia Nevins, MD Primary Care Physician:  Elfredia Nevins, MD Primary GI Physician: Levon Hedger   Chief Complaint  Patient presents with   Abdominal Pain    Follow up on abdominal pain. Taking omeprazole daily and has not noticed a difference. Wonders if she needs ct scan due to history of cancer.    HPI:   Kristi Weiss is a 77 y.o. 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  Last seen January 2023, at that time significant abdominal discomfort intermittently - possibly the symptoms started at the end of 2021. She describes the pain as a burning sensation which is worse after eating but she has also felt some pain and "hunger sensation" in the morning prior to eating. Sometimes, the pain can actually improve with food intake. States that her taste change significantly after she had radiation and chemotherapy. PCP put her on Nexium 40 mg BID but she felt the pain was worse with this medication and stopped it. Has tried taking Peptobismol which helps -she takes it intermittently.Was prescribed Carafate recently by her PCP but had not started it yet.   Patient recommended to schedule EGD, start omeprazole 40mg  daily. EGD was not completed   Present:  Patient reports some continued epigastric pain. She typically wakes up with this. Notes that I feels like a "grumbling" sensation. Pain sometimes depends on what she eats. She feels that eating sometimes makes pain better and other times it does not. Denies nausea or GERD symptoms. She note some early satiety at times. She notes that symptoms improved immediately with the use of Omeprazole initially, though the last prescription she got was time release which she states was different from her original prescription and this version of the PPI does not seem to work as well. No  weight loss. Denies rectal bleeding or melena.   Last WUJ:WJXBJ Last Colonoscopy: states had one multiple years ago, also reports she had a negative Cologuard last year but no results are available. I received the results of stool testing - she had a negative FOBT on 3/1-05/2018, no Cologuard was performed (test name is ColoCARE)   Past Medical History:  Diagnosis Date   Anxiety about health 08/23/2019   Arthritis    Atypical mole 06/30/2001   features of dys nevus- upper left back    Atypical mole 04/04/2020   mod-left upper arm -   GERD (gastroesophageal reflux disease)    Hypothyroidism    Hypothyroidism (acquired) 08/23/2019   Melanocytic nevus with features of Dysplastic Nevus 06/30/2001   Upper Left Back   Port-A-Cath in place 08/09/2019   SCC (squamous cell carcinoma) 04/12/2012   Left Forehead (Cx3,5FU)   SCC (squamous cell carcinoma) 03/16/2018   left upper cheek cx3 77fu   Scoliosis    Sinusitis    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    Past Surgical History:  Procedure Laterality Date   NO PAST SURGERIES     No prior surgery     PORTACATH PLACEMENT Right 08/01/2019   Procedure: INSERTION PORT-A-CATH;  Surgeon: Franky Macho, MD;  Location: [facility];  Service: General;  Laterality: Right;    Current Outpatient Medications  Medication Sig Dispense Refill   levothyroxine (SYNTHROID) 75 MCG tablet Take 75  mcg by mouth daily before breakfast.     Multiple Vitamin (MULTIVITAMIN) tablet Take 1 tablet by mouth.     omeprazole (PRILOSEC) 40 MG capsule Take 1 capsule (40 mg total) by mouth daily. 90 capsule 3   OVER THE COUNTER MEDICATION Calcium 3 -4 times per week     Rosuvastatin Calcium (CRESTOR PO) Take by mouth. Takes once or twice a week     No current facility-administered medications for this visit.   Facility-Administered Medications Ordered in Other Visits   Medication Dose Route Frequency Provider Last Rate Last Admin   sodium chloride flush (NS) 0.9 % injection 10 mL  10 mL Intravenous PRN Bertis Ruddy, Ni, MD   10 mL at 08/28/20 1541    Allergies as of 06/16/2022 - Review Complete 06/16/2022  Allergen Reaction Noted   Sulfamethoxazole-trimethoprim Other (See Comments) 06/04/2015   Keflex [cephalexin]  04/10/2021   Penicillins Rash 05/18/2013    Family History  Problem Relation Age of Onset   Lung cancer Mother        smoker   Cancer Mother        Throat   Lung cancer Father        smoker   Diabetes Father    CVA Brother    Diabetes Brother    Diabetes Maternal Grandmother    Heart attack Paternal Grandfather    Migraines Neg Hx    Headache Neg Hx     Social History   Socioeconomic History   Marital status: Widowed    Spouse name: Not on file   Number of children: 1   Years of education: Not on file   Highest education level: Not on file  Occupational History   Occupation: RETIRED  Tobacco Use   Smoking status: Former    Passive exposure: Past   Smokeless tobacco: Never   Tobacco comments:    some in her 48s and 30s  Vaping Use   Vaping Use: Never used  Substance and Sexual Activity   Alcohol use: Yes    Comment: once every 2 years   Drug use: Never   Sexual activity: Not Currently  Other Topics Concern   Not on file  Social History Narrative   Lives at home alone   Retired   Widow   Caffeine: about 4 cups coffee, 3 glasses of tea daily   Social Determinants of Health   Financial Resource Strain: Low Risk  (07/26/2019)   Overall Financial Resource Strain (CARDIA)    Difficulty of Paying Living Expenses: Not hard at all  Food Insecurity: No Food Insecurity (08/22/2019)   Hunger Vital Sign    Worried About Running Out of Food in the Last Year: Never true    Ran Out of Food in the Last Year: Never true  Transportation Needs: No Transportation Needs (08/22/2019)   PRAPARE - Scientist, research (physical sciences) (Medical): No    Lack of Transportation (Non-Medical): No  Physical Activity: Sufficiently Active (08/22/2019)   Exercise Vital Sign    Days of Exercise per Week: 5 days    Minutes of Exercise per Session: 30 min  Stress: Stress Concern Present (07/26/2019)   [employer] of Occupational Health - Occupational Stress Questionnaire    Feeling of Stress : Very much  Social Connections: Not on file    Review of systems General: negative for malaise, night sweats, fever, chills, weight loss Neck: Negative for lumps, goiter, pain and significant neck swelling Resp: Negative for  cough, wheezing, dyspnea at rest CV: Negative for chest pain, leg swelling, palpitations, orthopnea GI: denies melena, hematochezia, nausea, vomiting, diarrhea, constipation, dysphagia, odyonophagia,or unintentional weight loss. +epigastric pain +early satiety  MSK: Negative for joint pain or swelling, back pain, and muscle pain. Derm: Negative for itching or rash Psych: Denies depression, anxiety, memory loss, confusion. No homicidal or suicidal ideation.  Heme: Negative for prolonged bleeding, bruising easily, and swollen nodes. Endocrine: Negative for cold or heat intolerance, polyuria, polydipsia and goiter. Neuro: negative for tremor, gait imbalance, syncope and seizures. The remainder of the review of systems is noncontributory.  Physical Exam: BP 133/66 (BP Location: Left Arm, Patient Position: Sitting, Cuff Size: Large)   Pulse 74   Temp 98.6 F (37 C) (Oral)   Ht 5\' 7"  (1.702 m)   Wt 188 lb 6.4 oz (85.5 kg)   BMI 29.51 kg/m  General:   Alert and oriented. No distress noted. Pleasant and cooperative.  Head:  Normocephalic and atraumatic. Eyes:  Conjuctiva clear without scleral icterus. Mouth:  Oral mucosa pink and moist. Good dentition. No lesions. Heart: Normal rate and rhythm, s1 and s2 heart sounds present.  Lungs: Clear lung sounds in all lobes. Respirations equal and  unlabored. Abdomen:  +BS, soft, non-tender and non-distended. No rebound or guarding. No HSM or masses noted. Derm: No palmar erythema or jaundice Msk:  Symmetrical without gross deformities. Normal posture. Extremities:  Without edema. Neurologic:  Alert and  oriented x4 Psych:  Alert and cooperative. Normal mood and affect.  Invalid input(s): "6 MONTHS"   ASSESSMENT: Kristi Weiss is a 77 y.o. female presenting today for epigastric pain.  Continue epigastric pain with some early satiety, seen previously in early 2023 for the same, started on omeprazole and recommended to have EGD which was not completed. She noted some improvement with omeprazole initially though feels her last refill was a different version of the medication (other generic?) and is not having as much relief from PPI as she did initially with her epigastric pain. For now will continue with daily PPI, recommend proceeding with EGD for further evaluation as I cannot rule out gastritis, PUD, duodenitis, H pylori. Indications, risks and benefits of procedure discussed in detail with patient. Patient verbalized understanding and is in agreement to proceed with EGD.   PLAN: Continue omeprazole 40mg  daily  2.  Schedule EGD ASA III  All questions were answered, patient verbalized understanding and is in agreement with plan as outlined above.   Follow Up: 3 months   Alexsandria Kivett L. Jeanmarie Hubert, MSN, APRN, AGNP-C Adult-Gerontology Nurse Practitioner [facility]  I have reviewed the note and agree with the APP's assessment as described in this progress note  Agree with esophagogastroduodenospy for evaluation of abdominal pain. If negative will proceed with CT abdomen and pelvis. Will also need to discuss colonoscopy versus Cologuard in next appointment as she has not had recent screening test since 2020.  Katrinka Blazing, MD Gastroenterology and Hepatology [facility]

## 2022-06-16 NOTE — Telephone Encounter (Signed)
FYI-Pt would like to wait until June to have EGD scheduled. Encounter form placed in reminder file for June.

## 2022-06-16 NOTE — Patient Instructions (Addendum)
Please continue with omeprazole  once daily  We will schedule you for upper endoscopy for further evaluation of your symptoms  Follow up 3 months  It was a pleasure to see you today. I want to create trusting relationships with patients and provide genuine, compassionate, and quality care. I truly value your feedback! please be on the lookout for a survey regarding your visit with me today. I appreciate your input about our visit and your time in completing this!    Antwanette Wesche L. Jeanmarie Hubert, MSN, APRN, AGNP-C Adult-Gerontology Nurse Practitioner [facility] at [facility]

## 2022-06-16 NOTE — Telephone Encounter (Signed)
Per Brett Canales at [facility] they received a Rx on Prilosec stating we did not want the patient to have the time released medication. Per Brett Canales the time released is the only way that Prilosec comes. I spoke with Doylene Bode, NP she says if it is the only way it comes this would be fine to fill. I called Brett Canales at [facility] and advised that they can go ahead a fill the Prilosec. Brett Canales states understanding.

## 2022-06-19 ENCOUNTER — Encounter (HOSPITAL_COMMUNITY): Payer: Self-pay

## 2022-06-19 ENCOUNTER — Ambulatory Visit (HOSPITAL_COMMUNITY)
Admission: RE | Admit: 2022-06-19 | Discharge: 2022-06-19 | Disposition: A | Discharge status: Home / Self Care | Payer: PPO | Source: Ambulatory Visit | Attending: Hematology | Admitting: Hematology

## 2022-06-19 ENCOUNTER — Inpatient Hospital Stay: Payer: PPO | Attending: Hematology

## 2022-06-19 DIAGNOSIS — Z79899 Other long term (current) drug therapy: Secondary | ICD-10-CM | POA: Diagnosis not present

## 2022-06-19 DIAGNOSIS — E039 Hypothyroidism, unspecified: Secondary | ICD-10-CM | POA: Diagnosis not present

## 2022-06-19 DIAGNOSIS — C099 Malignant neoplasm of tonsil, unspecified: Secondary | ICD-10-CM | POA: Insufficient documentation

## 2022-06-19 DIAGNOSIS — C091 Malignant neoplasm of tonsillar pillar (anterior) (posterior): Secondary | ICD-10-CM | POA: Diagnosis not present

## 2022-06-19 DIAGNOSIS — R1310 Dysphagia, unspecified: Secondary | ICD-10-CM | POA: Diagnosis not present

## 2022-06-19 LAB — CBC WITH DIFFERENTIAL/PLATELET
Abs Immature Granulocytes: 0.01 10*3/uL (ref 0.00–0.07)
Basophils Absolute: 0 10*3/uL (ref 0.0–0.1)
Basophils Relative: 0 %
Eosinophils Absolute: 0.1 10*3/uL (ref 0.0–0.5)
Eosinophils Relative: 2 %
HCT: 38.9 % (ref 36.0–46.0)
Hemoglobin: 12.7 g/dL (ref 12.0–15.0)
Immature Granulocytes: 0 %
Lymphocytes Relative: 23 %
Lymphs Abs: 1.1 10*3/uL (ref 0.7–4.0)
MCH: 30.1 pg (ref 26.0–34.0)
MCHC: 32.6 g/dL (ref 30.0–36.0)
MCV: 92.2 fL (ref 80.0–100.0)
Monocytes Absolute: 0.4 10*3/uL (ref 0.1–1.0)
Monocytes Relative: 8 %
Neutro Abs: 3.1 10*3/uL (ref 1.7–7.7)
Neutrophils Relative %: 67 %
Platelets: 236 10*3/uL (ref 150–400)
RBC: 4.22 MIL/uL (ref 3.87–5.11)
RDW: 13.3 % (ref 11.5–15.5)
WBC: 4.6 10*3/uL (ref 4.0–10.5)
nRBC: 0 % (ref 0.0–0.2)

## 2022-06-19 LAB — COMPREHENSIVE METABOLIC PANEL
ALT: 16 U/L (ref 0–44)
AST: 20 U/L (ref 15–41)
Albumin: 3.9 g/dL (ref 3.5–5.0)
Alkaline Phosphatase: 80 U/L (ref 38–126)
Anion gap: 8 (ref 5–15)
BUN: 11 mg/dL (ref 8–23)
CO2: 26 mmol/L (ref 22–32)
Calcium: 8.7 mg/dL — ABNORMAL LOW (ref 8.9–10.3)
Chloride: 103 mmol/L (ref 98–111)
Creatinine, Ser: 0.87 mg/dL (ref 0.44–1.00)
GFR, Estimated: >60 mL/min (ref >60)
Glucose, Bld: 112 mg/dL — ABNORMAL HIGH (ref 70–99)
Potassium: 3.7 mmol/L (ref 3.5–5.1)
Sodium: 137 mmol/L (ref 135–145)
Total Bilirubin: 0.8 mg/dL (ref 0.3–1.2)
Total Protein: 6.5 g/dL (ref 6.5–8.1)

## 2022-06-19 LAB — TSH: TSH: 1.843 u[IU]/mL (ref 0.350–4.500)

## 2022-06-19 MED ORDER — HEPARIN SOD (PORK) LOCK FLUSH 100 UNIT/ML IV SOLN: 500.0000 [IU] | Freq: Once | INTRAVENOUS | Status: DC

## 2022-06-19 MED ORDER — HEPARIN SOD (PORK) LOCK FLUSH 100 UNIT/ML IV SOLN
INTRAVENOUS | Status: AC
  Filled 2022-06-19: qty 5

## 2022-06-19 MED ORDER — SODIUM CHLORIDE 0.9% FLUSH
10.0000 mL | Freq: Once | INTRAVENOUS | Status: AC
  Administered 2022-06-19: 10 mL via INTRAVENOUS

## 2022-06-19 MED ORDER — IOHEXOL 300 MG/ML  SOLN
75.0000 mL | Freq: Once | INTRAMUSCULAR | Status: AC | PRN
  Administered 2022-06-19: 75 mL via INTRAVENOUS

## 2022-06-19 MED ORDER — HEPARIN SOD (PORK) LOCK FLUSH 100 UNIT/ML IV SOLN
500.0000 [IU] | Freq: Once | INTRAVENOUS | Status: AC
  Administered 2022-06-19: 500 [IU] via INTRAVENOUS

## 2022-06-19 NOTE — Progress Notes (Signed)
Kristi Weiss presented for Portacath access , labs for CT.  Portacath located right chest wall accessed with  H 20 needle Power Port . Good blood return present. Portacath flushed with 20ml NS. Patient left accessed for CT. Procedure tolerated well and without incident. Vital signs stable. No complaints at this time. Discharged from clinic ambulatory to CT in stable condition. Alert and oriented x 3. F/U with [facility] as scheduled.

## 2022-06-19 NOTE — Patient Instructions (Signed)
[facility]  Discharge Instructions: Thank you for choosing [facility] to provide your oncology and hematology care.  If you have a lab appointment with the Cancer Center - please note that after April 8th, 2024, all labs will be drawn in the cancer center.  You do not have to check in or register with the main entrance as you have in the past but will complete your check-in in the cancer center.  Wear comfortable clothing and clothing appropriate for easy access to any Portacath or PICC line.   We strive to give you quality time with your provider. You may need to reschedule your appointment if you arrive late (15 or more minutes).  Arriving late affects you and other patients whose appointments are after yours.  Also, if you miss three or more appointments without notifying the office, you may be dismissed from the clinic at the provider's discretion.      For prescription refill requests, have your pharmacy contact our office and allow 72 hours for refills to be completed.    Today you received the following chemotherapy and/or immunotherapy agents port flush with lab.       To help prevent nausea and vomiting after your treatment, we encourage you to take your nausea medication as directed.  BELOW ARE SYMPTOMS THAT SHOULD BE REPORTED IMMEDIATELY: *FEVER GREATER THAN 100.4 F (38 C) OR HIGHER *CHILLS OR SWEATING *NAUSEA AND VOMITING THAT IS NOT CONTROLLED WITH YOUR NAUSEA MEDICATION *UNUSUAL SHORTNESS OF BREATH *UNUSUAL BRUISING OR BLEEDING *URINARY PROBLEMS (pain or burning when urinating, or frequent urination) *BOWEL PROBLEMS (unusual diarrhea, constipation, pain near the anus) TENDERNESS IN MOUTH AND THROAT WITH OR WITHOUT PRESENCE OF ULCERS (sore throat, sores in mouth, or a toothache) UNUSUAL RASH, SWELLING OR PAIN  UNUSUAL VAGINAL DISCHARGE OR ITCHING   Items with * indicate a potential emergency and should be followed up as soon as possible or  go to the Emergency Department if any problems should occur.  Please show the CHEMOTHERAPY ALERT CARD or IMMUNOTHERAPY ALERT CARD at check-in to the Emergency Department and triage nurse.  Should you have questions after your visit or need to cancel or reschedule your appointment, please contact [facility] AT [facility] [phone]  and follow the prompts.  Office hours are 8:00 a.m. to 4:30 p.m. Monday - Friday. Please note that voicemails left after 4:00 p.m. may not be returned until the following business day.  We are closed weekends and major holidays. You have access to a nurse at all times for urgent questions. Please call the main number to the clinic [phone] and follow the prompts.  For any non-urgent questions, you may also contact your provider using MyChart. We now offer e-Visits for anyone 25 and older to request care online for non-urgent symptoms. For details visit [email or web address].   Also download the MyChart app! Go to the app store, search "MyChart", open the app, select [place], and log in with your MyChart username and password.

## 2022-06-24 NOTE — Progress Notes (Signed)
[facility] [address] [place] [address]    Clinic Day:  06/25/2022  Referring physician: Elfredia Nevins, MD  Patient Care Team: Elfredia Nevins, MD as PCP - General (Internal Medicine) Jonelle Sidle, MD as PCP - Cardiology (Cardiology) Jena Gauss Gerrit Friends, MD as Consulting Physician (Gastroenterology) Doreatha Massed, MD as Consulting Physician (Medical Oncology) Glyn Ade, PA-C as Physician Assistant (Dermatology)   ASSESSMENT & PLAN:   Assessment: 1.  Squamous cell carcinoma, p16 positive of the left tonsil: - 06/29/2019 Biopsy A. LYMPH NODE, LEFT NECK, NEEDLE CORE BIOPSY:  - Squamous cell carcinoma, p16 positive.  COMMENT:  The carcinoma is positive with p16, p40, cytokeratin 5/6 and p63 consistent with squamous cell carcinoma.  The carcinoma is negative with Epstein-Barr virus (EBV), TTF-1 and Napsin-A.  -PET scan on 07/18/2019 showed left level 2A lymph node, 1 cm.  Small rounded lymph node 8 mm at level 2B on the left side.  Intense palatine tonsillar uptake with asymmetric fullness of tonsillar tissue and symmetrical FDG activity with masslike changes on the left side measuring 2.2 x 1.7 cm compared to the right.  SUV 16.2.  Small focus of increased activity in the right neck adjacent to or within the small nodule in the right hemithyroid. -An ultrasound of the thyroid gland showed heterogeneous thyroid with no masses. -She was evaluated by Dr.Yanagihara. -XRT with weekly cisplatin started on 08/16/2019.  Week 6 of cisplatin on 09/20/2019. -XRT completed on 10/05/2019. -PET CT scan on 12/25/2019 showed asymmetric hypermetabolic activity in the right lingual tonsil, similar to pretreatment PET scan.  Hypermetabolic right level 2 lymph node measures 5 mm with SUV 5.5.  A second lymph node measures 5 mm with SUV 4.3.  Complete resolution of the left tonsil, and left level 2 lymph nodes. -PET scan on 04/01/2020 with no hypermetabolic metastatic  disease.  Previously seen small hypermetabolic right level 2 neck lymph nodes have resolved.  No tonsillar hypermetabolism.   2.  Thyroid nodules: - Left thyroid nodule biopsy on 05/08/2020 consistent with benign follicular nodule, Bethesda category type II.  Right thyroid FNA was Bethesda category 1.    Plan: 1.  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  Postradiation skin thickening on the left side of the neck and submandibular region is also stable.  No new palpable adenopathy. - Labs today: Normal LFTs and CBC. - CT soft tissue neck (06/19/2022): Stable exam with no evidence of local recurrence. - Continue follow-up with Dr.Teoh. - RTC 6 months for follow-up with repeat CT soft tissue neck.  After next visit, I will image her once a year. - She wants to have port removed.  She will be referred to Dr. Lovell Sheehan.   2.  Hypothyroidism: - Continue Synthroid daily.  TSH is 1.8.   3.  Difficulty swallowing: - She is able to eat all sorts of foods.  She is gaining weight.  She has some dyspepsia for which she is seeing Dr. Levon Hedger.  Orders Placed This Encounter  Procedures   CT SOFT TISSUE NECK W CONTRAST    Standing Status:   Future    Standing Expiration Date:   06/25/2023    Order Specific Question:   If indicated for the ordered procedure, I authorize the administration of contrast media per Radiology protocol    Answer:   Yes    Order Specific  Question:   Does the patient have a contrast media/X-ray dye allergy?    Answer:   No    Order Specific Question:   Preferred imaging location?    Answer:   [facility]      I,Katie Daubenspeck,acting as a scribe for Doreatha Massed, MD.,have documented all relevant documentation on the behalf of Doreatha Massed, MD,as directed by  Doreatha Massed, MD while in the presence of Doreatha Massed, MD.   I,  Doreatha Massed MD, have reviewed the above documentation for accuracy and completeness, and I agree with the above.   Doreatha Massed, MD   4/25/202412:59 PM  CHIEF COMPLAINT:   Diagnosis: squamous cell carcinoma, p16 positive of the left tonsil    Cancer Staging  Primary tonsillar squamous cell carcinoma Staging form: Pharynx - HPV-Mediated Oropharynx, AJCC 8th Edition - Clinical stage from 07/26/2019: Stage I (cT2, cN1, cM0, p16+) - Signed by Doreatha Massed, MD on 07/26/2019    Prior Therapy: 1. Cisplatin & Aloxi x 6 cycles from 08/16/2019 to 09/20/2019 2. Chemoradiation therapy from to 10/05/2019.  Current Therapy:  surveillance    HISTORY OF PRESENT ILLNESS:   Oncology History  Primary tonsillar squamous cell carcinoma  07/26/2019 Initial Diagnosis   Squamous cell carcinoma of left tonsil (HCC)   07/26/2019 Cancer Staging   Staging form: Pharynx - HPV-Mediated Oropharynx, AJCC 8th Edition - Clinical stage from 07/26/2019: Stage I (cT2, cN1, cM0, p16+) - Signed by Doreatha Massed, MD on 07/26/2019   08/16/2019 - 09/20/2019 Chemotherapy   Patient is on Treatment Plan : HEAD/NECK Cisplatin q7d        INTERVAL HISTORY:   Kristi Weiss is a 77 y.o. female presenting to clinic today for follow up of squamous cell carcinoma, p16 positive of the left tonsil. She was last seen by me on 12/25/21.  Since her last visit, she underwent surveillance neck CT on 06/19/22 showing: stable exam without evidence of local recurrence or lymphadenopathy.  Today, she states that she is doing well overall. Her appetite level is at 75%. Her energy level is at 75%.  PAST MEDICAL HISTORY:   Past Medical History: Past Medical History:  Diagnosis Date   Anxiety about health 08/23/2019   Arthritis    Atypical mole 06/30/2001   features of dys nevus- upper left back    Atypical mole 04/04/2020   mod-left upper arm -   GERD (gastroesophageal reflux disease)    Hypothyroidism     Hypothyroidism (acquired) 08/23/2019   Melanocytic nevus with features of Dysplastic Nevus 06/30/2001   Upper Left Back   Port-A-Cath in place 08/09/2019   SCC (squamous cell carcinoma) 04/12/2012   Left Forehead (Cx3,5FU)   SCC (squamous cell carcinoma) 03/16/2018   left upper cheek cx3 93fu   Scoliosis    Sinusitis    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    Surgical History: Past Surgical History:  Procedure Laterality Date   NO PAST SURGERIES     No prior surgery     PORTACATH PLACEMENT Right 08/01/2019   Procedure: INSERTION PORT-A-CATH;  Surgeon: Franky Macho, MD;  Location: [facility];  Service: General;  Laterality: Right;    Social History: Social History   Socioeconomic History   Marital status: Widowed    Spouse name: Not on file   Number of children: 1   Years of education: Not on  file   Highest education level: Not on file  Occupational History   Occupation: RETIRED  Tobacco Use   Smoking status: Former    Passive exposure: Past   Smokeless tobacco: Never   Tobacco comments:    some in her 90s and 30s  Vaping Use   Vaping Use: Never used  Substance and Sexual Activity   Alcohol use: Yes    Comment: once every 2 years   Drug use: Never   Sexual activity: Not Currently  Other Topics Concern   Not on file  Social History Narrative   Lives at home alone   Retired   Widow   Caffeine: about 4 cups coffee, 3 glasses of tea daily   Social Determinants of Health   Financial Resource Strain: Low Risk  (07/26/2019)   Overall Financial Resource Strain (CARDIA)    Difficulty of Paying Living Expenses: Not hard at all  Food Insecurity: No Food Insecurity (08/22/2019)   Hunger Vital Sign    Worried About Running Out of Food in the Last Year: Never true    Ran Out of Food in the Last Year: Never true  Transportation Needs: No Transportation Needs (08/22/2019)    PRAPARE - Administrator, Civil Service (Medical): No    Lack of Transportation (Non-Medical): No  Physical Activity: Sufficiently Active (08/22/2019)   Exercise Vital Sign    Days of Exercise per Week: 5 days    Minutes of Exercise per Session: 30 min  Stress: Stress Concern Present (07/26/2019)   [employer] of Occupational Health - Occupational Stress Questionnaire    Feeling of Stress : Very much  Social Connections: Not on file  Intimate Partner Violence: Not At Risk (07/26/2019)   Humiliation, Afraid, Rape, and Kick questionnaire    Fear of Current or Ex-Partner: No    Emotionally Abused: No    Physically Abused: No    Sexually Abused: No    Family History: Family History  Problem Relation Age of Onset   Lung cancer Mother        smoker   Cancer Mother        Throat   Lung cancer Father        smoker   Diabetes Father    CVA Brother    Diabetes Brother    Diabetes Maternal Grandmother    Heart attack Paternal Grandfather    Migraines Neg Hx    Headache Neg Hx     Current Medications:  Current Outpatient Medications:    levothyroxine (SYNTHROID) 75 MCG tablet, Take 1 tablet by mouth daily., Disp: , Rfl:    Multiple Vitamin (MULTIVITAMIN) tablet, Take 1 tablet by mouth., Disp: , Rfl:    nitrofurantoin, macrocrystal-monohydrate, (MACROBID) 100 MG capsule, Take 100 mg by mouth every 12 (twelve) hours., Disp: , Rfl:    omeprazole (PRILOSEC) 40 MG capsule, Take 1 capsule (40 mg total) by mouth daily., Disp: 90 capsule, Rfl: 3   OVER THE COUNTER MEDICATION, Calcium 3 -4 times per week, Disp: , Rfl:    Rosuvastatin Calcium (CRESTOR PO), Take by mouth. Takes once or twice a week, Disp: , Rfl:    Vitamin D, Ergocalciferol, (DRISDOL) 1.25 MG (50000 UNIT) CAPS capsule, Take 50,000 Units by mouth once a week., Disp: , Rfl:  No current facility-administered medications for this visit.  Facility-Administered Medications Ordered in Other Visits:    sodium  chloride flush (NS) 0.9 % injection 10 mL, 10 mL, Intravenous, PRN, Artis Delay, MD,  10 mL at 08/28/20 1541   Allergies: Allergies  Allergen Reactions   Sulfamethoxazole-Trimethoprim Other (See Comments)    Headaches   Keflex [Cephalexin]     Rash post neck   Penicillins Rash    20 years    REVIEW OF SYSTEMS:   Review of Systems  Constitutional:  Negative for chills, fatigue and fever.  HENT:   Negative for lump/mass, mouth sores, nosebleeds, sore throat and trouble swallowing.   Eyes:  Negative for eye problems.  Respiratory:  Negative for cough and shortness of breath.   Cardiovascular:  Negative for chest pain, leg swelling and palpitations.  Gastrointestinal:  Negative for abdominal pain, constipation, diarrhea, nausea and vomiting.  Genitourinary:  Negative for bladder incontinence, difficulty urinating, dysuria, frequency, hematuria and nocturia.   Musculoskeletal:  Negative for arthralgias, back pain, flank pain, myalgias and neck pain.  Skin:  Negative for itching and rash.  Neurological:  Positive for headaches. Negative for dizziness and numbness.  Hematological:  Does not bruise/bleed easily.  Psychiatric/Behavioral:  Positive for sleep disturbance. Negative for depression and suicidal ideas. The patient is nervous/anxious.   All other systems reviewed and are negative.    VITALS:   Blood pressure (!) 163/83, pulse 77, temperature 98.1 F (36.7 C), temperature source Oral, resp. rate 17, SpO2 98 %.  Wt Readings from Last 3 Encounters:  06/16/22 188 lb 6.4 oz (85.5 kg)  06/19/21 182 lb 8.7 oz (82.8 kg)  03/19/21 173 lb 12.8 oz (78.8 kg)    There is no height or weight on file to calculate BMI.  Performance status (ECOG): 1 - Symptomatic but completely ambulatory  PHYSICAL EXAM:   Physical Exam Vitals and nursing note reviewed. Exam conducted with a chaperone present.  Constitutional:      Appearance: Normal appearance.  Cardiovascular:     Rate and Rhythm:  Normal rate and regular rhythm.     Pulses: Normal pulses.     Heart sounds: Normal heart sounds.  Pulmonary:     Effort: Pulmonary effort is normal.     Breath sounds: Normal breath sounds.  Abdominal:     Palpations: Abdomen is soft. There is no hepatomegaly, splenomegaly or mass.     Tenderness: There is no abdominal tenderness.  Musculoskeletal:     Right lower leg: No edema.     Left lower leg: No edema.  Lymphadenopathy:     Cervical: No cervical adenopathy.     Right cervical: No superficial, deep or posterior cervical adenopathy.    Left cervical: No superficial, deep or posterior cervical adenopathy.     Upper Body:     Right upper body: No supraclavicular or axillary adenopathy.     Left upper body: No supraclavicular or axillary adenopathy.  Neurological:     General: No focal deficit present.     Mental Status: She is alert and oriented to person, place, and time.  Psychiatric:        Mood and Affect: Mood normal.        Behavior: Behavior normal.     LABS:      Latest Ref Rng & Units 06/19/2022   10:11 AM 12/19/2021   10:02 AM 06/09/2021    8:25 AM  CBC  WBC 4.0 - 10.5 K/uL 4.6  4.7  4.9   Hemoglobin 12.0 - 15.0 g/dL 16.1  09.6  04.5   Hematocrit 36.0 - 46.0 % 38.9  39.7  38.6   Platelets 150 - 400 K/uL 236  251  222       Latest Ref Rng & Units 06/19/2022   10:11 AM 12/19/2021   10:02 AM 06/09/2021    8:25 AM  CMP  Glucose 70 - 99 mg/dL 161  96  98   BUN 8 - 23 mg/dL Creatinine 0.44 - 1.00 mg/dL 0.96  0.45  4.09   Sodium 135 - 145 mmol/L 137  139  138   Potassium 3.5 - 5.1 mmol/L 3.7  4.1  3.8   Chloride 98 - 111 mmol/L 103  103  103   CO2 22 - 32 mmol/L Calcium 8.9 - 10.3 mg/dL 8.7  8.8  8.7   Total Protein 6.5 - 8.1 g/dL 6.5  7.2  6.5   Total Bilirubin 0.3 - 1.2 mg/dL 0.8  0.9  0.8   Alkaline Phos 38 - 126 U/L 80  86  73   AST 15 - 41 U/L ALT 0 - 44 U/L No results found for: "CEA1", "CEA"  / No results found for: "CEA1", "CEA" No results found for: "PSA1" No results found for: "WJX914" No results found for: "CAN125"  No results found for: "TOTALPROTELP", "ALBUMINELP", "A1GS", "A2GS", "BETS", "BETA2SER", "GAMS", "MSPIKE", "SPEI" No results found for: "TIBC", "FERRITIN", "IRONPCTSAT" Lab Results  Component Value Date   LDH 152 09/06/2019     STUDIES:   CT SOFT TISSUE NECK W CONTRAST  Result Date: 06/23/2022 CLINICAL DATA:  Follow-up tonsillar squamous cell carcinoma. History of chemoradiation. EXAM: CT NECK WITH CONTRAST TECHNIQUE: Multidetector CT imaging of the neck was performed using the standard protocol following the bolus administration of intravenous contrast. RADIATION DOSE REDUCTION: This exam was performed according to the departmental dose-optimization program which includes automated exposure control, adjustment of the mA and/or kV according to patient size and/or use of iterative reconstruction technique. CONTRAST:  75mL OMNIPAQUE IOHEXOL 300 MG/ML  SOLN COMPARISON:  CT neck 12/19/2021 FINDINGS: Pharynx and larynx: The nasal cavity and nasopharynx are unremarkable. The oral cavity and oropharynx are unremarkable. The parapharyngeal spaces are clear. There is no mass lesion or abnormal enhancement to suggest local recurrence. The hypopharynx and larynx are unremarkable. The vocal folds are normal in appearance. The airway is patent.  There is no retropharyngeal fluid collection. Salivary glands: The left parotid and submandibular glands are atrophic, likely treatment related. The right parotid and submandibular glands are unremarkable. Thyroid: A 1.4 cm exophytic left thyroid nodule is unchanged, previously evaluated by ultrasound and biopsy. Lymph nodes: There is no pathologic lymphadenopathy in the neck. A few subcentimeter right cervical chain lymph nodes measuring up to 6 mm at level IIA are unchanged. Vascular: There is calcified plaque at both carotid bifurcations. The  major vasculature of the neck is otherwise unremarkable. A right chest wall port is partially imaged. Limited intracranial: Unremarkable. Visualized orbits: Unremarkable. Mastoids and visualized paranasal sinuses: Clear. Skeleton: There is no acute osseous abnormality or suspicious osseous lesion. Upper chest: The imaged lung apices are clear. Other: None. IMPRESSION: Stable exam without evidence of local recurrence or pathologic lymphadenopathy in the neck. NI-RADS 1. Electronically Signed   By: Lesia Hausen M.D.   On: 06/23/2022 17:47

## 2022-06-25 ENCOUNTER — Inpatient Hospital Stay (HOSPITAL_BASED_OUTPATIENT_CLINIC_OR_DEPARTMENT_OTHER): Payer: PPO | Admitting: Hematology

## 2022-06-25 VITALS — BP 163/83 | HR 77 | Temp 98.1°F | Resp 17

## 2022-06-25 DIAGNOSIS — C099 Malignant neoplasm of tonsil, unspecified: Secondary | ICD-10-CM | POA: Diagnosis not present

## 2022-06-25 NOTE — Patient Instructions (Addendum)
[facility] at [facility] Discharge Instructions   You were seen and examined today by Dr. Ellin Saba.  He reviewed the results of your lab work which is normal/stable.   He reviewed the results of your CT scan which is normal/stable. No evidence of cancer is seen on this exam.   We will see you back in 6 months. We will repeat a scan and lab work prior to your next visit.    Thank you for choosing [facility] at [facility] to provide your oncology and hematology care.  To afford each patient quality time with our provider, please arrive at least 15 minutes before your scheduled appointment time.   If you have a lab appointment with the Cancer Center please come in thru the Main Entrance and check in at the main information desk.  You need to re-schedule your appointment should you arrive 10 or more minutes late.  We strive to give you quality time with our providers, and arriving late affects you and other patients whose appointments are after yours.  Also, if you no show three or more times for appointments you may be dismissed from the clinic at the providers discretion.     Again, thank you for choosing [facility].  Our hope is that these requests will decrease the amount of time that you wait before being seen by our physicians.       _____________________________________________________________  Should you have questions after your visit to [facility], please contact our office at [phone] and follow the prompts.  Our office hours are 8:00 a.m. and 4:30 p.m. Monday - Friday.  Please note that voicemails left after 4:00 p.m. may not be returned until the following business day.  We are closed weekends and major holidays.  You do have access to a nurse 24-7, just call the main number to the clinic [phone] and do not press any options, hold on the line and a nurse will answer the phone.    For prescription  refill requests, have your pharmacy contact our office and allow 72 hours.    Due to Covid, you will need to wear a mask upon entering the hospital. If you do not have a mask, a mask will be given to you at the Main Entrance upon arrival. For doctor visits, patients may have 1 support person age 75 or older with them. For treatment visits, patients can not have anyone with them due to social distancing guidelines and our immunocompromised population.

## 2022-07-06 DIAGNOSIS — H40003 Preglaucoma, unspecified, bilateral: Secondary | ICD-10-CM | POA: Diagnosis not present

## 2022-07-16 DIAGNOSIS — D518 Other vitamin B12 deficiency anemias: Secondary | ICD-10-CM | POA: Diagnosis not present

## 2022-07-16 NOTE — Telephone Encounter (Signed)
Pt contacted and scheduled (tentatively) EGD for 08/27/22 at 2:15 pm. Instructions will be mailed once I have pre op appt.

## 2022-07-17 ENCOUNTER — Encounter (INDEPENDENT_AMBULATORY_CARE_PROVIDER_SITE_OTHER): Payer: Self-pay

## 2022-07-21 ENCOUNTER — Ambulatory Visit (INDEPENDENT_AMBULATORY_CARE_PROVIDER_SITE_OTHER): Payer: PPO | Admitting: General Surgery

## 2022-07-21 ENCOUNTER — Encounter: Payer: Self-pay | Admitting: General Surgery

## 2022-07-21 VITALS — BP 133/76 | HR 67 | Temp 98.1°F | Resp 16 | Ht 67.0 in | Wt 186.0 lb

## 2022-07-21 DIAGNOSIS — C099 Malignant neoplasm of tonsil, unspecified: Secondary | ICD-10-CM

## 2022-07-22 NOTE — Progress Notes (Signed)
Kristi Weiss; [id number]; [birth year]   HPI Patient is a 77 year old white female who was referred back to my care for by Dr. Ellin Saba of oncology for Port-A-Cath removal.  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    Past Surgical History:  Procedure Laterality Date   NO PAST SURGERIES     No prior surgery     PORTACATH PLACEMENT Right 08/01/2019   Procedure: INSERTION PORT-A-CATH;  Surgeon: Franky Macho, MD;  Location: [facility];  Service: General;  Laterality: Right;    Family History  Problem Relation Age of Onset   Lung cancer Mother        smoker   Cancer Mother        Throat   Lung cancer Father        smoker   Diabetes Father    CVA Brother    Diabetes Brother    Diabetes Maternal Grandmother    Heart attack Paternal Grandfather    Migraines Neg Hx    Headache Neg Hx     Current Outpatient Medications on File Prior to Visit  Medication Sig Dispense Refill   levothyroxine (SYNTHROID) 75 MCG tablet Take 1 tablet by mouth daily.     Multiple Vitamin (MULTIVITAMIN) tablet Take 1 tablet by mouth.     omeprazole (PRILOSEC) 40 MG capsule Take 1 capsule (40 mg total) by mouth daily. 90 capsule 3   OVER THE COUNTER MEDICATION Calcium  3 -4 times per week     Rosuvastatin Calcium (CRESTOR PO) Take by mouth. Takes once or twice a week     Vitamin D, Ergocalciferol, (DRISDOL) 1.25 MG (50000 UNIT) CAPS capsule Take 50,000 Units by mouth once a week.     Current Facility-Administered Medications on File Prior to Visit  Medication Dose Route Frequency Provider Last Rate Last Admin   sodium chloride flush (NS) 0.9 % injection 10 mL  10 mL Intravenous PRN Bertis Ruddy, Ni, MD   10 mL at 08/28/20 1541    Allergies  Allergen Reactions   Sulfamethoxazole-Trimethoprim Other (See Comments)    Headaches   Keflex [Cephalexin]     Rash post neck   Penicillins Rash    20 years    Social History   Substance and Sexual Activity  Alcohol Use Yes   Comment: once every 2 years    Social History   Tobacco Use  Smoking Status Former   Passive exposure: Past  Smokeless Tobacco Never  Tobacco Comments   some in her 53s and 30s    Review of Systems  Constitutional:  Positive for malaise/fatigue.  HENT: Negative.    Eyes: Negative.   Respiratory: Negative.  Cardiovascular: Negative.   Gastrointestinal: Negative.   Genitourinary: Negative.   Musculoskeletal: Negative.   Skin: Negative.   Neurological: Negative.   Endo/Heme/Allergies: Negative.   Psychiatric/Behavioral: Negative.      Objective   Vitals:   07/21/22 1405  BP: 133/76  Pulse: 67  Resp: 16  Temp: 98.1 F (36.7 C)  SpO2: 94%    Physical Exam Vitals reviewed.  Constitutional:      Appearance: Normal appearance. She is normal weight. She is not ill-appearing.  HENT:     Head: Normocephalic and atraumatic.  Cardiovascular:     Rate and Rhythm: Normal rate and regular rhythm.     Heart sounds: Normal heart sounds. No murmur heard.    No friction rub. No gallop.  Pulmonary:     Effort: Pulmonary effort is normal. No respiratory distress.     Breath sounds: Normal breath sounds. No stridor. No wheezing, rhonchi or rales.     Comments: Port-A-Cath  in place right upper chest Skin:    General: Skin is warm and dry.  Neurological:     Mental Status: She is alert and oriented to person, place, and time.    Previous office notes reviewed Assessment  Tonsillar carcinoma, finished with chemotherapy Plan  Patient is scheduled for Port-A-Cath removal in the minor procedure room on 07/29/2022.  The risks and benefits of the procedure were fully explained to the patient, who gave informed consent.

## 2022-07-23 NOTE — H&P (Signed)
Kristi Weiss; [id number]; 10/24/1945   HPI Patient is a 77-year-old white female who was referred back to my care for by Dr. Katragadda of oncology for Port-A-Cath removal.  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    Past Surgical History:  Procedure Laterality Date   NO PAST SURGERIES     No prior surgery     PORTACATH PLACEMENT Right 08/01/2019   Procedure: INSERTION PORT-A-CATH;  Surgeon: Skylynne Schlechter, MD;  Location: [facility];  Service: General;  Laterality: Right;    Family History  Problem Relation Age of Onset   Lung cancer Mother        smoker   Cancer Mother        Throat   Lung cancer Father        smoker   Diabetes Father    CVA Brother    Diabetes Brother    Diabetes Maternal Grandmother    Heart attack Paternal Grandfather    Migraines Neg Hx    Headache Neg Hx     Current Outpatient Medications on File Prior to Visit  Medication Sig Dispense Refill   levothyroxine (SYNTHROID) 75 MCG tablet Take 1 tablet by mouth daily.     Multiple Vitamin (MULTIVITAMIN) tablet Take 1 tablet by mouth.     omeprazole (PRILOSEC) 40 MG capsule Take 1 capsule (40 mg total) by mouth daily. 90 capsule 3   OVER THE COUNTER MEDICATION Calcium  3 -4 times per week     Rosuvastatin Calcium (CRESTOR PO) Take by mouth. Takes once or twice a week     Vitamin D, Ergocalciferol, (DRISDOL) 1.25 MG (50000 UNIT) CAPS capsule Take 50,000 Units by mouth once a week.     Current Facility-Administered Medications on File Prior to Visit  Medication Dose Route Frequency Provider Last Rate Last Admin   sodium chloride flush (NS) 0.9 % injection 10 mL  10 mL Intravenous PRN Gorsuch, Ni, MD   10 mL at 08/28/20 1541    Allergies  Allergen Reactions   Sulfamethoxazole-Trimethoprim Other (See Comments)    Headaches   Keflex [Cephalexin]     Rash post neck   Penicillins Rash    20 years    Social History   Substance and Sexual Activity  Alcohol Use Yes   Comment: once every 2 years    Social History   Tobacco Use  Smoking Status Former   Passive exposure: Past  Smokeless Tobacco Never  Tobacco Comments   some in her 20s and 30s    Review of Systems  Constitutional:  Positive for malaise/fatigue.  HENT: Negative.    Eyes: Negative.   Respiratory: Negative.      Cardiovascular: Negative.   Gastrointestinal: Negative.   Genitourinary: Negative.   Musculoskeletal: Negative.   Skin: Negative.   Neurological: Negative.   Endo/Heme/Allergies: Negative.   Psychiatric/Behavioral: Negative.      Objective   Vitals:   07/21/22 1405  BP: 133/76  Pulse: 67  Resp: 16  Temp: 98.1 F (36.7 C)  SpO2: 94%    Physical Exam Vitals reviewed.  Constitutional:      Appearance: Normal appearance. She is normal weight. She is not ill-appearing.  HENT:     Head: Normocephalic and atraumatic.  Cardiovascular:     Rate and Rhythm: Normal rate and regular rhythm.     Heart sounds: Normal heart sounds. No murmur heard.    No friction rub. No gallop.  Pulmonary:     Effort: Pulmonary effort is normal. No respiratory distress.     Breath sounds: Normal breath sounds. No stridor. No wheezing, rhonchi or rales.     Comments: Port-A-Cath  in place right upper chest Skin:    General: Skin is warm and dry.  Neurological:     Mental Status: She is alert and oriented to person, place, and time.    Previous office notes reviewed Assessment  Tonsillar carcinoma, finished with chemotherapy Plan  Patient is scheduled for Port-A-Cath removal in the minor procedure room on 07/29/2022.  The risks and benefits of the procedure were fully explained to the patient, who gave informed consent. 

## 2022-07-29 ENCOUNTER — Ambulatory Visit (HOSPITAL_COMMUNITY)
Admission: RE | Admit: 2022-07-29 | Discharge: 2022-07-29 | Disposition: A | Discharge status: Home / Self Care | Payer: PPO | Attending: General Surgery | Admitting: General Surgery

## 2022-07-29 ENCOUNTER — Encounter (HOSPITAL_COMMUNITY)
Admission: RE | Disposition: A | Discharge status: Home / Self Care | Payer: Self-pay | Source: Home / Self Care | Attending: General Surgery

## 2022-07-29 DIAGNOSIS — C099 Malignant neoplasm of tonsil, unspecified: Secondary | ICD-10-CM | POA: Diagnosis not present

## 2022-07-29 DIAGNOSIS — Z9221 Personal history of antineoplastic chemotherapy: Secondary | ICD-10-CM | POA: Insufficient documentation

## 2022-07-29 HISTORY — PX: PORT-A-CATH REMOVAL: SHX5289

## 2022-07-29 SURGERY — MINOR REMOVAL PORT-A-CATH
Anesthesia: LOCAL | Laterality: Right

## 2022-07-29 MED ORDER — LIDOCAINE HCL (PF) 1 % IJ SOLN
INTRAMUSCULAR | Status: AC
  Filled 2022-07-29: qty 30

## 2022-07-29 MED ORDER — LIDOCAINE HCL (PF) 1 % IJ SOLN
INTRAMUSCULAR | Status: DC | PRN
  Administered 2022-07-29: 4 mL

## 2022-07-29 SURGICAL SUPPLY — 21 items
ADH SKN CLS APL DERMABOND .7 (GAUZE/BANDAGES/DRESSINGS) ×1
APL PRP STRL LF ISPRP CHG 10.5 (MISCELLANEOUS) ×1
APPLICATOR CHLORAPREP 10.5 ORG (MISCELLANEOUS) ×2 IMPLANT
CLOTH BEACON ORANGE TIMEOUT ST (SAFETY) ×2 IMPLANT
DECANTER SPIKE VIAL GLASS SM (MISCELLANEOUS) ×2 IMPLANT
DERMABOND ADVANCED .7 DNX12 (GAUZE/BANDAGES/DRESSINGS) ×2 IMPLANT
DRAPE HALF SHEET 40X57 (DRAPES) IMPLANT
ELECT REM PT RETURN 9FT ADLT (ELECTROSURGICAL) ×1
ELECTRODE REM PT RTRN 9FT ADLT (ELECTROSURGICAL) ×2 IMPLANT
GLOVE BIOGEL PI IND STRL 7.0 (GLOVE) ×4 IMPLANT
GLOVE SURG SS PI 7.5 STRL IVOR (GLOVE) ×4 IMPLANT
GOWN STRL REUS W/TWL LRG LVL3 (GOWN DISPOSABLE) IMPLANT
NDL HYPO 25X1 1.5 SAFETY (NEEDLE) ×2 IMPLANT
NEEDLE HYPO 25X1 1.5 SAFETY (NEEDLE) ×1 IMPLANT
PENCIL SMOKE EVACUATOR COATED (MISCELLANEOUS) IMPLANT
SPONGE GAUZE 2X2 8PLY STRL LF (GAUZE/BANDAGES/DRESSINGS) ×2 IMPLANT
SUT MNCRL AB 4-0 PS2 18 (SUTURE) ×2 IMPLANT
SUT VIC AB 3-0 SH 27 (SUTURE) ×1
SUT VIC AB 3-0 SH 27X BRD (SUTURE) ×2 IMPLANT
SYR CONTROL 10ML LL (SYRINGE) ×2 IMPLANT
TOWEL OR 17X26 4PK STRL BLUE (TOWEL DISPOSABLE) ×2 IMPLANT

## 2022-07-29 NOTE — Op Note (Signed)
Patient:  Kristi Weiss  DOB:  25-May-1945  MRN:  [id number]   Preop Diagnosis: Tonsillar carcinoma, finished with chemotherapy  Postop Diagnosis: Same  Procedure: Port-A-Cath removal  Surgeon: Franky Macho, MD  Anes: Local  Indications: Patient is a 77 year old white female who has finished her chemotherapy and presents for removal of her Port-A-Cath.  The risks and benefits of the procedure including bleeding and infection were fully explained to the patient, who gave informed consent.  Procedure note: The patient was placed in the supine position in the minor procedure room.  The right upper chest was prepped and draped using usual sterile technique with ChloraPrep.  Surgical site confirmation was performed.  1% Xylocaine was used for local anesthesia.  An incision was made through the previous surgical incision site.  This was taken down to the Port-A-Cath.  The Port-A-Cath was removed in total without difficulty.  It was disposed of.  The subcutaneous layer was reapproximated using a 3-0 Vicryl interrupted suture.  The skin was closed using a 4-0 Monocryl subcuticular suture.  Dermabond was applied.  All tape and needle counts were correct at the end of the procedure.  The patient was discharged from the minor procedure room in good and stable condition.  Complications: None  EBL: Minimal  Specimen: None

## 2022-07-29 NOTE — Interval H&P Note (Signed)
History and Physical Interval Note:  07/29/2022 7:12 AM  Kristi Weiss  has presented today for surgery, with the diagnosis of Port-a-cath in place.  The various methods of treatment have been discussed with the patient and family. After consideration of risks, benefits and other options for treatment, the patient has consented to  Procedure(s): MINOR REMOVAL PORT-A-CATH (Right) as a surgical intervention.  The patient's history has been reviewed, patient examined, no change in status, stable for surgery.  I have reviewed the patient's chart and labs.  Questions were answered to the patient's satisfaction.     Franky Macho

## 2022-07-31 ENCOUNTER — Encounter (HOSPITAL_COMMUNITY): Payer: Self-pay | Admitting: General Surgery

## 2022-08-18 ENCOUNTER — Telehealth (INDEPENDENT_AMBULATORY_CARE_PROVIDER_SITE_OTHER): Payer: Self-pay | Admitting: Gastroenterology

## 2022-08-18 NOTE — Telephone Encounter (Signed)
Pt called in and needed to cancel EGD for 08/25/22. Pt states she is keeping her grandson for the summer. Advised pt I could call back when August schedule is up. Pt verbalized understanding.

## 2022-08-25 ENCOUNTER — Encounter (HOSPITAL_COMMUNITY): Payer: PPO

## 2022-08-27 ENCOUNTER — Encounter (HOSPITAL_COMMUNITY): Payer: Self-pay

## 2022-08-27 ENCOUNTER — Ambulatory Visit (HOSPITAL_COMMUNITY): Admit: 2022-08-27 | Payer: PPO | Admitting: Gastroenterology

## 2022-08-27 SURGERY — ESOPHAGOGASTRODUODENOSCOPY (EGD) WITH PROPOFOL
Anesthesia: Monitor Anesthesia Care

## 2022-09-16 NOTE — Telephone Encounter (Signed)
Left message to return call 

## 2022-09-22 ENCOUNTER — Encounter: Payer: Self-pay | Admitting: General Surgery

## 2022-09-22 ENCOUNTER — Ambulatory Visit (INDEPENDENT_AMBULATORY_CARE_PROVIDER_SITE_OTHER): Payer: PPO | Admitting: Gastroenterology

## 2022-09-22 ENCOUNTER — Ambulatory Visit (INDEPENDENT_AMBULATORY_CARE_PROVIDER_SITE_OTHER): Payer: PPO | Admitting: General Surgery

## 2022-09-22 VITALS — BP 148/79 | HR 70 | Temp 97.8°F | Resp 16 | Ht 67.0 in | Wt 186.0 lb

## 2022-09-22 DIAGNOSIS — Z09 Encounter for follow-up examination after completed treatment for conditions other than malignant neoplasm: Secondary | ICD-10-CM

## 2022-09-22 DIAGNOSIS — E538 Deficiency of other specified B group vitamins: Secondary | ICD-10-CM | POA: Diagnosis not present

## 2022-09-22 NOTE — Progress Notes (Addendum)
Subjective:     Kristi Weiss  Patient returns to my care for wound check, status post Port-A-Cath removal on the right side.  She states she noticed a lump at the site of her Port-A-Cath removal.  She was concerned that this could be a clot.  She found this recently.  Her port was removed on Jul 29, 2022. Objective:    BP (!) 148/79   Pulse 70   Temp 97.8 F (36.6 C) (Oral)   Resp 16   Ht 5\' 7"  (1.702 m)   Wt 186 lb (84.4 kg)   SpO2 93%   BMI 29.13 kg/m   General:  alert, cooperative, and no distress  Former Port-A-Cath site healing well.  Incision healing well.  No ecchymosis noted.  Small pea-sized subcutaneous knot is noted.  It is nontender.     Assessment:    Doing well postoperatively. Normal scarring and subcutaneous tissue    Plan:   I reassured the patient that this was nothing to worry about.  This is not a clot.  I suspect it is normal scar tissue from the port removal.  She understands and agrees.  Follow-up here as needed.

## 2022-10-01 DIAGNOSIS — H903 Sensorineural hearing loss, bilateral: Secondary | ICD-10-CM | POA: Diagnosis not present

## 2022-10-01 DIAGNOSIS — H6982 Other specified disorders of Eustachian tube, left ear: Secondary | ICD-10-CM | POA: Diagnosis not present

## 2022-10-26 ENCOUNTER — Encounter (INDEPENDENT_AMBULATORY_CARE_PROVIDER_SITE_OTHER): Payer: Self-pay

## 2022-10-30 DIAGNOSIS — Z85819 Personal history of malignant neoplasm of unspecified site of lip, oral cavity, and pharynx: Secondary | ICD-10-CM | POA: Diagnosis not present

## 2022-10-30 DIAGNOSIS — R070 Pain in throat: Secondary | ICD-10-CM | POA: Diagnosis not present

## 2022-11-06 ENCOUNTER — Telehealth (INDEPENDENT_AMBULATORY_CARE_PROVIDER_SITE_OTHER): Payer: Self-pay | Admitting: Gastroenterology

## 2022-11-06 NOTE — Telephone Encounter (Signed)
Thanks

## 2022-11-06 NOTE — Telephone Encounter (Signed)
Pt left voicemail needing to cancel EGD for 11/17/22 at 11:30am with Dr.Castaneda. pt pre op has been scheduled for 11/13/22 and pt will be out of town. Pt states she we call back when she gets back in town to get rescheduled.

## 2022-11-13 ENCOUNTER — Encounter (HOSPITAL_COMMUNITY): Payer: PPO

## 2022-11-17 ENCOUNTER — Encounter (HOSPITAL_COMMUNITY): Payer: Self-pay

## 2022-11-17 ENCOUNTER — Ambulatory Visit (HOSPITAL_COMMUNITY): Admit: 2022-11-17 | Payer: PPO | Admitting: Gastroenterology

## 2022-11-17 SURGERY — ESOPHAGOGASTRODUODENOSCOPY (EGD) WITH PROPOFOL
Anesthesia: Monitor Anesthesia Care

## 2022-11-18 DIAGNOSIS — X32XXXA Exposure to sunlight, initial encounter: Secondary | ICD-10-CM | POA: Diagnosis not present

## 2022-11-18 DIAGNOSIS — L570 Actinic keratosis: Secondary | ICD-10-CM | POA: Diagnosis not present

## 2022-11-18 DIAGNOSIS — Z1283 Encounter for screening for malignant neoplasm of skin: Secondary | ICD-10-CM | POA: Diagnosis not present

## 2022-11-18 DIAGNOSIS — D225 Melanocytic nevi of trunk: Secondary | ICD-10-CM | POA: Diagnosis not present

## 2022-12-02 ENCOUNTER — Telehealth (INDEPENDENT_AMBULATORY_CARE_PROVIDER_SITE_OTHER): Payer: Self-pay | Admitting: Gastroenterology

## 2022-12-02 NOTE — Telephone Encounter (Signed)
Pt left voicemail wanting to go ahead schedule EGD that was cancelled in September. Returned call but had to leave message (Egd asa 3 castaneda)

## 2022-12-03 NOTE — Telephone Encounter (Signed)
Pt left voicemail in regards to rescheduling EGD.  Contacted pt and tentatively scheduled her for 12/18/22 at 11:45am. Pt will call back to let me know if she can or can not make this day.

## 2022-12-04 NOTE — Telephone Encounter (Signed)
Pt left voicemail asking for call back Pt would like to wait until first of November to have repeat EGD. Pt has a lot going on with Dr.K. Will call pt first of November.

## 2022-12-15 ENCOUNTER — Other Ambulatory Visit: Payer: Self-pay

## 2022-12-15 DIAGNOSIS — C099 Malignant neoplasm of tonsil, unspecified: Secondary | ICD-10-CM

## 2022-12-16 ENCOUNTER — Ambulatory Visit (HOSPITAL_COMMUNITY)
Admission: RE | Admit: 2022-12-16 | Discharge: 2022-12-16 | Disposition: A | Discharge status: Home / Self Care | Payer: PPO | Source: Ambulatory Visit | Attending: Hematology | Admitting: Hematology

## 2022-12-16 ENCOUNTER — Inpatient Hospital Stay: Payer: PPO | Attending: Hematology

## 2022-12-16 DIAGNOSIS — E041 Nontoxic single thyroid nodule: Secondary | ICD-10-CM | POA: Insufficient documentation

## 2022-12-16 DIAGNOSIS — E039 Hypothyroidism, unspecified: Secondary | ICD-10-CM | POA: Insufficient documentation

## 2022-12-16 DIAGNOSIS — C760 Malignant neoplasm of head, face and neck: Secondary | ICD-10-CM | POA: Diagnosis not present

## 2022-12-16 DIAGNOSIS — Z79899 Other long term (current) drug therapy: Secondary | ICD-10-CM | POA: Insufficient documentation

## 2022-12-16 DIAGNOSIS — C099 Malignant neoplasm of tonsil, unspecified: Secondary | ICD-10-CM | POA: Diagnosis not present

## 2022-12-16 DIAGNOSIS — I700 Atherosclerosis of aorta: Secondary | ICD-10-CM | POA: Diagnosis not present

## 2022-12-16 DIAGNOSIS — D518 Other vitamin B12 deficiency anemias: Secondary | ICD-10-CM | POA: Diagnosis not present

## 2022-12-16 DIAGNOSIS — Z79890 Hormone replacement therapy (postmenopausal): Secondary | ICD-10-CM | POA: Insufficient documentation

## 2022-12-16 DIAGNOSIS — Z923 Personal history of irradiation: Secondary | ICD-10-CM | POA: Insufficient documentation

## 2022-12-16 DIAGNOSIS — Z9221 Personal history of antineoplastic chemotherapy: Secondary | ICD-10-CM | POA: Insufficient documentation

## 2022-12-16 DIAGNOSIS — Z85818 Personal history of malignant neoplasm of other sites of lip, oral cavity, and pharynx: Secondary | ICD-10-CM | POA: Insufficient documentation

## 2022-12-16 DIAGNOSIS — I890 Lymphedema, not elsewhere classified: Secondary | ICD-10-CM | POA: Insufficient documentation

## 2022-12-16 LAB — CBC WITH DIFFERENTIAL/PLATELET
Abs Immature Granulocytes: 0.01 10*3/uL (ref 0.00–0.07)
Basophils Absolute: 0 10*3/uL (ref 0.0–0.1)
Basophils Relative: 0 %
Eosinophils Absolute: 0.1 10*3/uL (ref 0.0–0.5)
Eosinophils Relative: 2 %
HCT: 41 % (ref 36.0–46.0)
Hemoglobin: 13.3 g/dL (ref 12.0–15.0)
Immature Granulocytes: 0 %
Lymphocytes Relative: 27 %
Lymphs Abs: 1.5 10*3/uL (ref 0.7–4.0)
MCH: 29.7 pg (ref 26.0–34.0)
MCHC: 32.4 g/dL (ref 30.0–36.0)
MCV: 91.5 fL (ref 80.0–100.0)
Monocytes Absolute: 0.4 10*3/uL (ref 0.1–1.0)
Monocytes Relative: 8 %
Neutro Abs: 3.4 10*3/uL (ref 1.7–7.7)
Neutrophils Relative %: 63 %
Platelets: 272 10*3/uL (ref 150–400)
RBC: 4.48 MIL/uL (ref 3.87–5.11)
RDW: 13.2 % (ref 11.5–15.5)
WBC: 5.4 10*3/uL (ref 4.0–10.5)
nRBC: 0 % (ref 0.0–0.2)

## 2022-12-16 LAB — COMPREHENSIVE METABOLIC PANEL
ALT: 17 U/L (ref 0–44)
AST: 20 U/L (ref 15–41)
Albumin: 4.1 g/dL (ref 3.5–5.0)
Alkaline Phosphatase: 86 U/L (ref 38–126)
Anion gap: 7 (ref 5–15)
BUN: 14 mg/dL (ref 8–23)
CO2: 29 mmol/L (ref 22–32)
Calcium: 8.9 mg/dL (ref 8.9–10.3)
Chloride: 98 mmol/L (ref 98–111)
Creatinine, Ser: 0.98 mg/dL (ref 0.44–1.00)
GFR, Estimated: >60 mL/min (ref >60)
Glucose, Bld: 98 mg/dL (ref 70–99)
Potassium: 3.9 mmol/L (ref 3.5–5.1)
Sodium: 134 mmol/L — ABNORMAL LOW (ref 135–145)
Total Bilirubin: 0.7 mg/dL (ref 0.3–1.2)
Total Protein: 6.9 g/dL (ref 6.5–8.1)

## 2022-12-16 LAB — TSH: TSH: 2.138 u[IU]/mL (ref 0.350–4.500)

## 2022-12-16 MED ORDER — IOHEXOL 300 MG/ML  SOLN
75.0000 mL | Freq: Once | INTRAMUSCULAR | Status: AC | PRN
  Administered 2022-12-16: 75 mL via INTRAVENOUS

## 2022-12-18 DIAGNOSIS — E663 Overweight: Secondary | ICD-10-CM | POA: Diagnosis not present

## 2022-12-18 DIAGNOSIS — R0789 Other chest pain: Secondary | ICD-10-CM | POA: Diagnosis not present

## 2022-12-18 DIAGNOSIS — Z6828 Body mass index (BMI) 28.0-28.9, adult: Secondary | ICD-10-CM | POA: Diagnosis not present

## 2022-12-18 DIAGNOSIS — Z1331 Encounter for screening for depression: Secondary | ICD-10-CM | POA: Diagnosis not present

## 2022-12-18 DIAGNOSIS — M5416 Radiculopathy, lumbar region: Secondary | ICD-10-CM | POA: Diagnosis not present

## 2022-12-18 DIAGNOSIS — R1013 Epigastric pain: Secondary | ICD-10-CM | POA: Diagnosis not present

## 2022-12-18 DIAGNOSIS — Z0001 Encounter for general adult medical examination with abnormal findings: Secondary | ICD-10-CM | POA: Diagnosis not present

## 2022-12-18 DIAGNOSIS — I10 Essential (primary) hypertension: Secondary | ICD-10-CM | POA: Diagnosis not present

## 2022-12-18 DIAGNOSIS — R7309 Other abnormal glucose: Secondary | ICD-10-CM | POA: Diagnosis not present

## 2022-12-18 DIAGNOSIS — K2100 Gastro-esophageal reflux disease with esophagitis, without bleeding: Secondary | ICD-10-CM | POA: Diagnosis not present

## 2022-12-18 DIAGNOSIS — M1991 Primary osteoarthritis, unspecified site: Secondary | ICD-10-CM | POA: Diagnosis not present

## 2022-12-18 DIAGNOSIS — I700 Atherosclerosis of aorta: Secondary | ICD-10-CM | POA: Diagnosis not present

## 2022-12-23 ENCOUNTER — Inpatient Hospital Stay: Payer: PPO | Admitting: Hematology

## 2022-12-23 VITALS — BP 138/80 | HR 66 | Temp 98.3°F | Resp 18 | Ht 67.0 in | Wt 184.3 lb

## 2022-12-23 DIAGNOSIS — K2100 Gastro-esophageal reflux disease with esophagitis, without bleeding: Secondary | ICD-10-CM | POA: Diagnosis not present

## 2022-12-23 DIAGNOSIS — Z79890 Hormone replacement therapy (postmenopausal): Secondary | ICD-10-CM | POA: Diagnosis not present

## 2022-12-23 DIAGNOSIS — C099 Malignant neoplasm of tonsil, unspecified: Secondary | ICD-10-CM | POA: Diagnosis not present

## 2022-12-23 DIAGNOSIS — Z79899 Other long term (current) drug therapy: Secondary | ICD-10-CM | POA: Diagnosis not present

## 2022-12-23 DIAGNOSIS — Z923 Personal history of irradiation: Secondary | ICD-10-CM | POA: Diagnosis not present

## 2022-12-23 DIAGNOSIS — Z9221 Personal history of antineoplastic chemotherapy: Secondary | ICD-10-CM | POA: Diagnosis not present

## 2022-12-23 DIAGNOSIS — Z85818 Personal history of malignant neoplasm of other sites of lip, oral cavity, and pharynx: Secondary | ICD-10-CM | POA: Diagnosis not present

## 2022-12-23 DIAGNOSIS — I890 Lymphedema, not elsewhere classified: Secondary | ICD-10-CM | POA: Diagnosis not present

## 2022-12-23 DIAGNOSIS — E039 Hypothyroidism, unspecified: Secondary | ICD-10-CM | POA: Diagnosis not present

## 2022-12-23 NOTE — Patient Instructions (Signed)
[facility] at [facility] Discharge Instructions   You were seen and examined today by Dr. Ellin Saba.  He reviewed the results of your lab work which are normal/stable.   He reviewed the results of your CT scan which did not show any evidence of cancer.   We will see you back in 1 year. We will repeat lab work and a scan prior to your next visit.   Return as scheduled.    Thank you for choosing [facility] at [facility] to provide your oncology and hematology care.  To afford each patient quality time with our provider, please arrive at least 15 minutes before your scheduled appointment time.   If you have a lab appointment with the Cancer Center please come in thru the Main Entrance and check in at the main information desk.  You need to re-schedule your appointment should you arrive 10 or more minutes late.  We strive to give you quality time with our providers, and arriving late affects you and other patients whose appointments are after yours.  Also, if you no show three or more times for appointments you may be dismissed from the clinic at the providers discretion.     Again, thank you for choosing [facility].  Our hope is that these requests will decrease the amount of time that you wait before being seen by our physicians.       _____________________________________________________________  Should you have questions after your visit to [facility], please contact our office at [phone] and follow the prompts.  Our office hours are 8:00 a.m. and 4:30 p.m. Monday - Friday.  Please note that voicemails left after 4:00 p.m. may not be returned until the following business day.  We are closed weekends and major holidays.  You do have access to a nurse 24-7, just call the main number to the clinic [phone] and do not press any options, hold on the line and a nurse will answer the phone.    For prescription  refill requests, have your pharmacy contact our office and allow 72 hours.    Due to Covid, you will need to wear a mask upon entering the hospital. If you do not have a mask, a mask will be given to you at the Main Entrance upon arrival. For doctor visits, patients may have 1 support person age 62 or older with them. For treatment visits, patients can not have anyone with them due to social distancing guidelines and our immunocompromised population.

## 2022-12-23 NOTE — Progress Notes (Signed)
[facility] [address] [place] [address]    Clinic Day:  12/23/2022  Referring physician: Elfredia Nevins, MD  Patient Care Team: Elfredia Nevins, MD as PCP - General (Internal Medicine) Jonelle Sidle, MD as PCP - Cardiology (Cardiology) Jena Gauss Gerrit Friends, MD as Consulting Physician (Gastroenterology) Doreatha Massed, MD as Consulting Physician (Medical Oncology) Glyn Ade, PA-C as Physician Assistant (Dermatology)   ASSESSMENT & PLAN:   Assessment: 1.  Squamous cell carcinoma, p16 positive of the left tonsil: - 06/29/2019 Biopsy A. LYMPH NODE, LEFT NECK, NEEDLE CORE BIOPSY:  - Squamous cell carcinoma, p16 positive.  COMMENT:  The carcinoma is positive with p16, p40, cytokeratin 5/6 and p63 consistent with squamous cell carcinoma.  The carcinoma is negative with Epstein-Barr virus (EBV), TTF-1 and Napsin-A.  -PET scan on 07/18/2019 showed left level 2A lymph node, 1 cm.  Small rounded lymph node 8 mm at level 2B on the left side.  Intense palatine tonsillar uptake with asymmetric fullness of tonsillar tissue and symmetrical FDG activity with masslike changes on the left side measuring 2.2 x 1.7 cm compared to the right.  SUV 16.2.  Small focus of increased activity in the right neck adjacent to or within the small nodule in the right hemithyroid. -An ultrasound of the thyroid gland showed heterogeneous thyroid with no masses. -She was evaluated by Dr.Yanagihara. -XRT with weekly cisplatin started on 08/16/2019.  Week 6 of cisplatin on 09/20/2019. -XRT completed on 10/05/2019. -PET CT scan on 12/25/2019 showed asymmetric hypermetabolic activity in the right lingual tonsil, similar to pretreatment PET scan.  Hypermetabolic right level 2 lymph node measures 5 mm with SUV 5.5.  A second lymph node measures 5 mm with SUV 4.3.  Complete resolution of the left tonsil, and left level 2 lymph nodes. -PET scan on 04/01/2020 with no hypermetabolic metastatic  disease.  Previously seen small hypermetabolic right level 2 neck lymph nodes have resolved.  No tonsillar hypermetabolism.   2.  Thyroid nodules: - Left thyroid nodule biopsy on 05/08/2020 consistent with benign follicular nodule, Bethesda category type II.  Right thyroid FNA was Bethesda category 1.    Plan: 1.  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  No palpable adenopathy.  Lymphedema in the left mental region is stable. - Labs today: Normal LFTs and CBC. - CT soft tissue neck (12/16/2022): No evidence of local recurrence. - Continue follow-up with Dr. Suszanne Conners every 6 months. - RTC 1 year for follow-up with repeat labs and CT scan.   2.  Hypothyroidism: - Continue Synthroid daily.  TSH is 2.1.     Orders Placed This Encounter  Procedures   CT SOFT TISSUE NECK W CONTRAST    Standing Status:   Future    Standing Expiration Date:   12/23/2023    Order Specific Question:   If indicated for the ordered procedure, I authorize the administration of contrast media per Radiology protocol    Answer:   Yes    Order Specific Question:   Does the patient have a contrast media/X-ray dye allergy?    Answer:   No    Order Specific Question:   Preferred imaging location?    Answer:   [facility]      I,Katie Daubenspeck,acting as a scribe for Doreatha Massed, MD.,have documented all relevant documentation on the behalf of Doreatha Massed, MD,as directed by  Doreatha Massed, MD while in the presence of Doreatha Massed, MD.   I, Doreatha Massed MD, have reviewed the above documentation for accuracy and completeness, and I agree with the above.   Doreatha Massed, MD   10/23/20244:44 PM  CHIEF COMPLAINT:   Diagnosis: squamous cell carcinoma, p16 positive of the left tonsil    Cancer Staging  Primary tonsillar squamous cell carcinoma (HCC) Staging form:  Pharynx - HPV-Mediated Oropharynx, AJCC 8th Edition - Clinical stage from 07/26/2019: Stage I (cT2, cN1, cM0, p16+) - Signed by Doreatha Massed, MD on 07/26/2019    Prior Therapy: 1. Cisplatin & Aloxi x 6 cycles from 08/16/2019 to 09/20/2019 2. Chemoradiation therapy with weekly cisplatin from 08/16/19 to 10/05/19.  Current Therapy:  surveillance   HISTORY OF PRESENT ILLNESS:   Oncology History  Primary tonsillar squamous cell carcinoma (HCC)  07/26/2019 Initial Diagnosis   Squamous cell carcinoma of left tonsil (HCC)   07/26/2019 Cancer Staging   Staging form: Pharynx - HPV-Mediated Oropharynx, AJCC 8th Edition - Clinical stage from 07/26/2019: Stage I (cT2, cN1, cM0, p16+) - Signed by Doreatha Massed, MD on 07/26/2019   08/16/2019 - 09/20/2019 Chemotherapy   Patient is on Treatment Plan : HEAD/NECK Cisplatin q7d        INTERVAL HISTORY:   Kristi Weiss is a 77 y.o. female presenting to clinic today for follow up of squamous cell carcinoma, p16 positive of the left tonsil. She was last seen by me on 06/25/22.  Since her last visit, she underwent surveillance neck CT on 12/16/22.   Today, she states that she is doing well overall. Her appetite level is at 75%. Her energy level is at 60%.  PAST MEDICAL HISTORY:   Past Medical History: Past Medical History:  Diagnosis Date   Anxiety about health 08/23/2019   Arthritis    Atypical mole 06/30/2001   features of dys nevus- upper left back    Atypical mole 04/04/2020   mod-left upper arm -   GERD (gastroesophageal reflux disease)    Hypothyroidism    Hypothyroidism (acquired) 08/23/2019   Melanocytic nevus with features of Dysplastic Nevus 06/30/2001   Upper Left Back   Port-A-Cath in place 08/09/2019   SCC (squamous cell carcinoma) 04/12/2012   Left Forehead (Cx3,5FU)   SCC (squamous cell carcinoma) 03/16/2018   left upper cheek cx3 72fu   Scoliosis    Sinusitis    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    Surgical History: Past Surgical History:  Procedure Laterality Date   NO PAST SURGERIES     No prior surgery     PORT-A-CATH REMOVAL Right 07/29/2022   Procedure: MINOR REMOVAL PORT-A-CATH;  Surgeon: Franky Macho, MD;  Location: [facility];  Service: General;  Laterality: Right;   PORTACATH PLACEMENT Right 08/01/2019   Procedure: INSERTION PORT-A-CATH;  Surgeon: Franky Macho, MD;  Location: [facility];  Service: General;  Laterality: Right;    Social History: Social History   Socioeconomic History   Marital status: Widowed    Spouse name: Not on file   Number of children: 1   Years of education: Not on file   Highest education level: Not on file  Occupational History   Occupation: RETIRED  Tobacco Use   Smoking status: Former    Passive exposure: Past   Smokeless tobacco: Never   Tobacco comments:    some in  her 76s and 30s  Vaping Use   Vaping status: Never Used  Substance and Sexual Activity   Alcohol use: Yes    Comment: once every 2 years   Drug use: Never   Sexual activity: Not Currently  Other Topics Concern   Not on file  Social History Narrative   Lives at home alone   Retired   Widow   Caffeine: about 4 cups coffee, 3 glasses of tea daily   Social Determinants of Health   Financial Resource Strain: Low Risk  (07/26/2019)   Overall Financial Resource Strain (CARDIA)    Difficulty of Paying Living Expenses: Not hard at all  Food Insecurity: No Food Insecurity (08/22/2019)   Hunger Vital Sign    Worried About Running Out of Food in the Last Year: Never true    Ran Out of Food in the Last Year: Never true  Transportation Needs: No Transportation Needs (08/22/2019)   PRAPARE - Administrator, Civil Service (Medical): No    Lack of Transportation (Non-Medical): No  Physical Activity: Sufficiently Active (08/22/2019)   Exercise Vital Sign    Days of Exercise per Week: 5  days    Minutes of Exercise per Session: 30 min  Stress: Stress Concern Present (07/26/2019)   [employer] of Occupational Health - Occupational Stress Questionnaire    Feeling of Stress : Very much  Social Connections: Not on file  Intimate Partner Violence: Not At Risk (07/26/2019)   Humiliation, Afraid, Rape, and Kick questionnaire    Fear of Current or Ex-Partner: No    Emotionally Abused: No    Physically Abused: No    Sexually Abused: No    Family History: Family History  Problem Relation Age of Onset   Lung cancer Mother        smoker   Cancer Mother        Throat   Lung cancer Father        smoker   Diabetes Father    CVA Brother    Diabetes Brother    Diabetes Maternal Grandmother    Heart attack Paternal Grandfather    Migraines Neg Hx    Headache Neg Hx     Current Medications:  Current Outpatient Medications:    Calcium Carb-Cholecalciferol (CALTRATE BONE HEALTH PO), Take 1 tablet by mouth daily., Disp: , Rfl:    Cyanocobalamin (VITAMIN B-12 IJ), Inject as directed once a week., Disp: , Rfl:    levothyroxine (SYNTHROID) 75 MCG tablet, Take 75 mcg by mouth daily., Disp: , Rfl:    Multiple Vitamin (MULTIVITAMIN) tablet, Take 1 tablet by mouth daily., Disp: , Rfl:    omeprazole (PRILOSEC) 40 MG capsule, Take 1 capsule (40 mg total) by mouth daily., Disp: 90 capsule, Rfl: 3   pantoprazole (PROTONIX) 40 MG tablet, Take 40 mg by mouth 2 (two) times daily., Disp: , Rfl:    rosuvastatin (CRESTOR) 10 MG tablet, Take 10 mg by mouth 2 (two) times a week., Disp: , Rfl:    sucralfate (CARAFATE) 1 g tablet, Take 2 g by mouth at bedtime., Disp: , Rfl:    VIT B12-METHIONINE-INOS-CHOL IM, Inject into the muscle., Disp: , Rfl:  No current facility-administered medications for this visit.  Facility-Administered Medications Ordered in Other Visits:    sodium chloride flush (NS) 0.9 % injection 10 mL, 10 mL, Intravenous, PRN, Bertis Ruddy, Ni, MD, 10 mL at 08/28/20 1541    Allergies: Allergies  Allergen Reactions   Sulfamethoxazole-Trimethoprim Other (See  Comments)    Headaches   Keflex [Cephalexin] Other (See Comments)    Rash or hives post neck   Penicillins Rash    20 years    REVIEW OF SYSTEMS:   Review of Systems  Constitutional:  Negative for chills, fatigue and fever.  HENT:   Negative for lump/mass, mouth sores, nosebleeds, sore throat and trouble swallowing.   Eyes:  Negative for eye problems.  Respiratory:  Negative for cough and shortness of breath.   Cardiovascular:  Positive for chest pain. Negative for leg swelling and palpitations.  Gastrointestinal:  Negative for abdominal pain, constipation, diarrhea, nausea and vomiting.  Genitourinary:  Negative for bladder incontinence, difficulty urinating, dysuria, frequency, hematuria and nocturia.   Musculoskeletal:  Negative for arthralgias, back pain, flank pain, myalgias and neck pain.  Skin:  Negative for itching and rash.  Neurological:  Positive for headaches. Negative for dizziness and numbness.  Hematological:  Does not bruise/bleed easily.  Psychiatric/Behavioral:  Positive for depression. Negative for sleep disturbance and suicidal ideas. The patient is nervous/anxious.   All other systems reviewed and are negative.    VITALS:   Blood pressure 138/80, pulse 66, temperature 98.3 F (36.8 C), temperature source Oral, resp. rate 18, height 5\' 7"  (1.702 m), weight 184 lb 4.8 oz (83.6 kg), SpO2 96%.  Wt Readings from Last 3 Encounters:  12/23/22 184 lb 4.8 oz (83.6 kg)  09/22/22 186 lb (84.4 kg)  07/21/22 186 lb (84.4 kg)    Body mass index is 28.87 kg/m.  Performance status (ECOG): 1 - Symptomatic but completely ambulatory  PHYSICAL EXAM:   Physical Exam Vitals and nursing note reviewed. Exam conducted with a chaperone present.  Constitutional:      Appearance: Normal appearance.  Cardiovascular:     Rate and Rhythm: Normal rate and regular rhythm.     Pulses: Normal  pulses.     Heart sounds: Normal heart sounds.  Pulmonary:     Effort: Pulmonary effort is normal.     Breath sounds: Normal breath sounds.  Abdominal:     Palpations: Abdomen is soft. There is no hepatomegaly, splenomegaly or mass.     Tenderness: There is no abdominal tenderness.  Musculoskeletal:     Right lower leg: No edema.     Left lower leg: No edema.  Lymphadenopathy:     Cervical: No cervical adenopathy.     Right cervical: No superficial, deep or posterior cervical adenopathy.    Left cervical: No superficial, deep or posterior cervical adenopathy.     Upper Body:     Right upper body: No supraclavicular or axillary adenopathy.     Left upper body: No supraclavicular or axillary adenopathy.  Neurological:     General: No focal deficit present.     Mental Status: She is alert and oriented to person, place, and time.  Psychiatric:        Mood and Affect: Mood normal.        Behavior: Behavior normal.     LABS:      Latest Ref Rng & Units 12/16/2022   12:51 PM 06/19/2022   10:11 AM 12/19/2021   10:02 AM  CBC  WBC 4.0 - 10.5 K/uL 5.4  4.6  4.7   Hemoglobin 12.0 - 15.0 g/dL 82.9  56.2  13.0   Hematocrit 36.0 - 46.0 % 41.0  38.9  39.7   Platelets 150 - 400 K/uL 272  236  251       Latest Ref  Rng & Units 12/16/2022   12:51 PM 06/19/2022   10:11 AM 12/19/2021   10:02 AM  CMP  Glucose 70 - 99 mg/dL 98  098  96   BUN 8 - 23 mg/dL 14  11  13    Creatinine 0.44 - 1.00 mg/dL 1.19  1.47  8.29   Sodium 135 - 145 mmol/L 134  137  139   Potassium 3.5 - 5.1 mmol/L 3.9  3.7  4.1   Chloride 98 - 111 mmol/L 98  103  103   CO2 22 - 32 mmol/L 29  26  27    Calcium 8.9 - 10.3 mg/dL 8.9  8.7  8.8   Total Protein 6.5 - 8.1 g/dL 6.9  6.5  7.2   Total Bilirubin 0.3 - 1.2 mg/dL 0.7  0.8  0.9   Alkaline Phos 38 - 126 U/L 86  80  86   AST 15 - 41 U/L 20  20  18    ALT 0 - 44 U/L 17  16  14       No results found for: "CEA1", "CEA" / No results found for: "CEA1", "CEA" No results  found for: "PSA1" No results found for: "FAO130" No results found for: "CAN125"  No results found for: "TOTALPROTELP", "ALBUMINELP", "A1GS", "A2GS", "BETS", "BETA2SER", "GAMS", "MSPIKE", "SPEI" No results found for: "TIBC", "FERRITIN", "IRONPCTSAT" Lab Results  Component Value Date   LDH 152 09/06/2019     STUDIES:   CT SOFT TISSUE NECK W CONTRAST  Result Date: 12/23/2022 CLINICAL DATA:  Head/neck cancer, monitor EXAM: CT NECK WITH CONTRAST TECHNIQUE: Multidetector CT imaging of the neck was performed using the standard protocol following the bolus administration of intravenous contrast. RADIATION DOSE REDUCTION: This exam was performed according to the departmental dose-optimization program which includes automated exposure control, adjustment of the mA and/or kV according to patient size and/or use of iterative reconstruction technique. CONTRAST:  75mL OMNIPAQUE IOHEXOL 300 MG/ML  SOLN COMPARISON:  None Available. FINDINGS: Pharynx and larynx: Unremarkable appearance of the nasal cavity and the nasopharynx, which appear stable. Unremarkable appearance of the oral cavity in the oropharynx which appear stable. Streak artifact from dental amalgam limits assessment. Unremarkable appearance of the hypopharynx and larynx, which appear stable. Patent airway. No fluid collection identified. Salivary glands: Atrophic glands on the left, similar to prior and likely due to radiation change Thyroid: Similar 1.3 cm exophytic left thyroid nodule, previously evaluated by ultrasound and biopsy. Lymph nodes: No pathologically enlarged lymph nodes in the neck. Similar versus slightly decreased size of a 5 mm short axis right level II node. Vascular: Atherosclerosis. No large vessel occlusion in the neck on limited assessment due to non arterial timing. Limited intracranial: Negative. Visualized orbits: Negative. Mastoids and visualized paranasal sinuses: Clear. Skeleton: No acute or aggressive process. Upper chest:  Visualized lung apices are clear. IMPRESSION: No evidence of local recurrence or pathologic lymphadenopathy. Stable exam. NI-RADS 1. Electronically Signed   By: Feliberto Harts M.D.   On: 12/23/2022 11:14

## 2023-01-06 NOTE — Telephone Encounter (Signed)
Left message to return call 

## 2023-01-11 NOTE — Telephone Encounter (Signed)
Pt called in and needing to reschedule EGD. Pt has been rescheduled to 02/08/23 at 11am. Updated instructions will be mailed.

## 2023-01-11 NOTE — Telephone Encounter (Signed)
Pt left message ready to schedule EGD.  Returned call to patient. Pt scheduled for 01/26/23 at 1:00pm. Instructions will be sent once pre op appt is received.

## 2023-01-22 ENCOUNTER — Other Ambulatory Visit (HOSPITAL_COMMUNITY): Payer: PPO

## 2023-02-01 ENCOUNTER — Telehealth (INDEPENDENT_AMBULATORY_CARE_PROVIDER_SITE_OTHER): Payer: Self-pay | Admitting: Gastroenterology

## 2023-02-01 NOTE — Telephone Encounter (Signed)
Pt left message needing to reschedule pre op that is on 02/04/23. Pt is going to be out of town. Returned call to pt and gave her Carolyn's number at pre op to call and reschedule.

## 2023-02-04 ENCOUNTER — Other Ambulatory Visit (HOSPITAL_COMMUNITY): Payer: PPO

## 2023-02-05 ENCOUNTER — Encounter (HOSPITAL_COMMUNITY): Payer: Self-pay

## 2023-02-05 ENCOUNTER — Encounter (HOSPITAL_COMMUNITY)
Admission: RE | Admit: 2023-02-05 | Discharge: 2023-02-05 | Disposition: A | Discharge status: Home / Self Care | Payer: PPO | Source: Ambulatory Visit | Attending: Gastroenterology | Admitting: Gastroenterology

## 2023-02-05 HISTORY — DX: Other complications of anesthesia, initial encounter: T88.59XA

## 2023-02-08 ENCOUNTER — Ambulatory Visit (HOSPITAL_COMMUNITY): Payer: PPO | Admitting: Anesthesiology

## 2023-02-08 ENCOUNTER — Other Ambulatory Visit: Payer: Self-pay

## 2023-02-08 ENCOUNTER — Encounter (HOSPITAL_COMMUNITY)
Admission: RE | Disposition: A | Discharge status: Home / Self Care | Payer: Self-pay | Source: Home / Self Care | Attending: Gastroenterology

## 2023-02-08 ENCOUNTER — Ambulatory Visit (HOSPITAL_COMMUNITY)
Admission: RE | Admit: 2023-02-08 | Discharge: 2023-02-08 | Disposition: A | Discharge status: Home / Self Care | Payer: PPO | Attending: Gastroenterology | Admitting: Gastroenterology

## 2023-02-08 ENCOUNTER — Encounter (HOSPITAL_COMMUNITY): Payer: Self-pay | Admitting: Gastroenterology

## 2023-02-08 DIAGNOSIS — K3189 Other diseases of stomach and duodenum: Secondary | ICD-10-CM

## 2023-02-08 DIAGNOSIS — K319 Disease of stomach and duodenum, unspecified: Secondary | ICD-10-CM | POA: Diagnosis not present

## 2023-02-08 DIAGNOSIS — I10 Essential (primary) hypertension: Secondary | ICD-10-CM | POA: Insufficient documentation

## 2023-02-08 DIAGNOSIS — R1013 Epigastric pain: Secondary | ICD-10-CM | POA: Diagnosis not present

## 2023-02-08 DIAGNOSIS — K219 Gastro-esophageal reflux disease without esophagitis: Secondary | ICD-10-CM | POA: Diagnosis not present

## 2023-02-08 DIAGNOSIS — Z85818 Personal history of malignant neoplasm of other sites of lip, oral cavity, and pharynx: Secondary | ICD-10-CM | POA: Insufficient documentation

## 2023-02-08 DIAGNOSIS — Z923 Personal history of irradiation: Secondary | ICD-10-CM | POA: Diagnosis not present

## 2023-02-08 DIAGNOSIS — E039 Hypothyroidism, unspecified: Secondary | ICD-10-CM | POA: Diagnosis not present

## 2023-02-08 DIAGNOSIS — Z9221 Personal history of antineoplastic chemotherapy: Secondary | ICD-10-CM | POA: Insufficient documentation

## 2023-02-08 DIAGNOSIS — Z87891 Personal history of nicotine dependence: Secondary | ICD-10-CM | POA: Insufficient documentation

## 2023-02-08 DIAGNOSIS — K449 Diaphragmatic hernia without obstruction or gangrene: Secondary | ICD-10-CM | POA: Diagnosis not present

## 2023-02-08 HISTORY — PX: ESOPHAGOGASTRODUODENOSCOPY (EGD) WITH PROPOFOL: SHX5813

## 2023-02-08 HISTORY — PX: BIOPSY: SHX5522

## 2023-02-08 SURGERY — ESOPHAGOGASTRODUODENOSCOPY (EGD) WITH PROPOFOL
Anesthesia: General

## 2023-02-08 MED ORDER — SODIUM CHLORIDE 0.9 % IV SOLN: INTRAVENOUS | Status: DC | PRN

## 2023-02-08 MED ORDER — LACTATED RINGERS IV SOLN: INTRAVENOUS | Status: DC

## 2023-02-08 MED ORDER — LIDOCAINE HCL (PF) 2 % IJ SOLN
INTRAMUSCULAR | Status: DC | PRN
  Administered 2023-02-08: 100 mg via INTRADERMAL

## 2023-02-08 MED ORDER — PROPOFOL 10 MG/ML IV BOLUS
INTRAVENOUS | Status: DC | PRN
  Administered 2023-02-08: 75 mg via INTRAVENOUS

## 2023-02-08 MED ORDER — LIDOCAINE HCL (PF) 2 % IJ SOLN
INTRAMUSCULAR | Status: AC
  Filled 2023-02-08: qty 5

## 2023-02-08 MED ORDER — PROPOFOL 10 MG/ML IV BOLUS
INTRAVENOUS | Status: AC
  Filled 2023-02-08: qty 20

## 2023-02-08 MED ORDER — EPHEDRINE 5 MG/ML INJ
INTRAVENOUS | Status: AC
  Filled 2023-02-08: qty 5

## 2023-02-08 NOTE — Anesthesia Preprocedure Evaluation (Signed)
Anesthesia Evaluation  Patient identified by MRN, date of birth, ID band Patient awake    Reviewed: Allergy & Precautions, H&P , NPO status , Patient's Chart, lab work & pertinent test results, reviewed documented beta blocker date and time   History of Anesthesia Complications (+) history of anesthetic complications  Airway Mallampati: II  TM Distance: >3 FB Neck ROM: full    Dental no notable dental hx.    Pulmonary neg pulmonary ROS, former smoker   Pulmonary exam normal breath sounds clear to auscultation       Cardiovascular Exercise Tolerance: Good hypertension, negative cardio ROS  Rhythm:regular Rate:Normal     Neuro/Psych  Headaches  Anxiety     negative neurological ROS  negative psych ROS   GI/Hepatic negative GI ROS, Neg liver ROS,GERD  ,,  Endo/Other  negative endocrine ROSHypothyroidism    Renal/GU negative Renal ROS  negative genitourinary   Musculoskeletal   Abdominal   Peds  Hematology negative hematology ROS (+)   Anesthesia Other Findings   Reproductive/Obstetrics negative OB ROS                             Anesthesia Physical Anesthesia Plan  ASA: 3  Anesthesia Plan: General   Post-op Pain Management:    Induction:   PONV Risk Score and Plan: Propofol infusion  Airway Management Planned:   Additional Equipment:   Intra-op Plan:   Post-operative Plan:   Informed Consent: I have reviewed the patients History and Physical, chart, labs and discussed the procedure including the risks, benefits and alternatives for the proposed anesthesia with the patient or authorized representative who has indicated his/her understanding and acceptance.     Dental Advisory Given  Plan Discussed with: CRNA  Anesthesia Plan Comments:        Anesthesia Quick Evaluation

## 2023-02-08 NOTE — Op Note (Signed)
[facility] Patient Name: Kristi Weiss Procedure Date: 02/08/2023 9:37 AM MRN: [id number] Date of Birth: Dec 30, 1945 Attending MD: Katrinka Blazing , , [id number] CSN: [id number] Age: 77 Admit Type: Outpatient Procedure:                Upper GI endoscopy Indications:              Epigastric abdominal pain Providers:                Katrinka Blazing, Angelica Ran, Elinor Parkinson Referring MD:              Medicines:                Monitored Anesthesia Care Complications:            No immediate complications. Estimated Blood Loss:     Estimated blood loss: none. Procedure:                Pre-Anesthesia Assessment:                           - Prior to the procedure, a History and Physical                            was performed, and patient medications, allergies                            and sensitivities were reviewed. The patient's                            tolerance of previous anesthesia was reviewed.                           - The risks and benefits of the procedure and the                            sedation options and risks were discussed with the                            patient. All questions were answered and informed                            consent was obtained.                           - ASA Grade Assessment: II - A patient with mild                            systemic disease.                           After obtaining informed consent, the endoscope was                            passed under direct vision. Throughout the                            procedure, the patient's blood pressure,  pulse, and                            oxygen saturations were monitored continuously. The                            [id number]-[id number] ([id number]) scope was introduced through the                            mouth, and advanced to the second part of duodenum.                            The upper GI endoscopy was accomplished without                            difficulty. The patient  tolerated the procedure                            well. Scope In: 10:01:16 AM Scope Out: 10:05:33 AM Total Procedure Duration: 0 hours 4 minutes 17 seconds  Findings:      A 1 cm hiatal hernia was present.      The entire examined stomach was normal. Biopsies were taken with a cold       forceps for Helicobacter pylori testing.      The examined duodenum was normal. Impression:               - 1 cm hiatal hernia.                           - Normal stomach. Biopsied.                           - Normal examined duodenum. Moderate Sedation:      Per Anesthesia Care Recommendation:           - Discharge patient to home (ambulatory).                           - Resume previous diet.                           - Await pathology results.                           - Continue present medications.                           - If negative biopsies, do a CT abdomen and pelvis                            with IV contrast.                           - No high dose aspirin, ibuprofen, naproxen, or                            other non-steroidal anti-inflammatory drugs. Procedure Code(s):        ---  Professional ---                           [phone], Esophagogastroduodenoscopy, flexible,                            transoral; with biopsy, single or multiple Diagnosis Code(s):        --- Professional ---                           K44.9, Diaphragmatic hernia without obstruction or                            gangrene                           R10.13, Epigastric pain CPT copyright 2022 American Medical Association. All rights reserved. The codes documented in this report are preliminary and upon coder review may  be revised to meet current compliance requirements. Katrinka Blazing, MD Katrinka Blazing,  02/08/2023 10:18:27 AM This report has been signed electronically. Number of Addenda: 0

## 2023-02-08 NOTE — H&P (Signed)
Kristi Weiss is an 77 y.o. female.   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  Patient has reported persistent abdominal pain in the epigastric area despite taking omeprazole on a daily basis.  No nausea, vomiting, fever, chills, abdominal distention, melena hematochezia.  Past Medical History:  Diagnosis Date   Anxiety about health 08/23/2019   Arthritis    Atypical mole 06/30/2001   features of dys nevus- upper left back    Atypical mole 04/04/2020   mod-left upper arm -   Complication of anesthesia    Complication of anesthesia    throat raw from radiation   GERD (gastroesophageal reflux disease)    Hypothyroidism    Hypothyroidism (acquired) 08/23/2019   Melanocytic nevus with features of Dysplastic Nevus 06/30/2001   Upper Left Back   Port-A-Cath in place 08/09/2019   SCC (squamous cell carcinoma) 04/12/2012   Left Forehead (Cx3,5FU)   SCC (squamous cell carcinoma) 03/16/2018   left upper cheek cx3 76fu   Scoliosis    Sinusitis    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    Past Surgical History:  Procedure Laterality Date   NO PAST SURGERIES     No prior surgery     PORT-A-CATH REMOVAL Right 07/29/2022   Procedure: MINOR REMOVAL PORT-A-CATH;  Surgeon: Franky Macho, MD;  Location: [facility];  Service: General;  Laterality: Right;   PORTACATH PLACEMENT Right 08/01/2019   Procedure: INSERTION PORT-A-CATH;  Surgeon: Franky Macho, MD;  Location: [facility];  Service: General;  Laterality: Right;    Family History  Problem Relation Age of Onset   Lung cancer Mother        smoker   Cancer Mother        Throat   Lung  cancer Father        smoker   Diabetes Father    CVA Brother    Diabetes Brother    Diabetes Maternal Grandmother    Heart attack Paternal Grandfather    Migraines Neg Hx    Headache Neg Hx    Social History:  reports that she has quit smoking. She has been exposed to tobacco smoke. She has never used smokeless tobacco. She reports current alcohol use. She reports that she does not use drugs.  Allergies:  Allergies  Allergen Reactions   Sulfamethoxazole-Trimethoprim Other (See Comments)    Headaches   Keflex [Cephalexin] Other (See Comments)    Rash or hives post neck   Penicillins Rash    20 years    Medications Prior to Admission  Medication Sig Dispense Refill   Calcium Carb-Cholecalciferol (CALTRATE BONE HEALTH PO) Take 1 tablet by mouth daily.     Cyanocobalamin (VITAMIN B-12 IJ) Inject as directed once a week.     ibuprofen (ADVIL) 200 MG tablet Take 200 mg by mouth every 6 (six) hours as needed.     levothyroxine (SYNTHROID) 75 MCG tablet Take 75 mcg by mouth daily.     Multiple Vitamin (MULTIVITAMIN) tablet Take 1 tablet by mouth daily.     omeprazole (PRILOSEC) 40 MG capsule Take 1 capsule (40 mg total) by mouth daily. 90 capsule 3   rosuvastatin (CRESTOR)  10 MG tablet Take 10 mg by mouth 2 (two) times a week.     sucralfate (CARAFATE) 1 g tablet Take 2 g by mouth at bedtime.     pantoprazole (PROTONIX) 40 MG tablet Take 40 mg by mouth 2 (two) times daily.     VIT B12-METHIONINE-INOS-CHOL IM Inject into the muscle.      No results found for this or any previous visit (from the past 48 hour(s)). No results found.  Review of Systems  All other systems reviewed and are negative.   Blood pressure (!) 146/74, pulse 72, temperature 98.3 F (36.8 C), temperature source Oral, resp. rate 15, height 5\' 6"  (1.676 m), weight 78.9 kg, SpO2 99%. Physical Exam  GENERAL: The patient is AO x3, in no acute distress. HEENT: Head is normocephalic and atraumatic. EOMI are intact.  Mouth is well hydrated and without lesions. NECK: Supple. No masses LUNGS: Clear to auscultation. No presence of rhonchi/wheezing/rales. Adequate chest expansion HEART: RRR, normal s1 and s2. ABDOMEN: Soft, nontender, no guarding, no peritoneal signs, and nondistended. BS +. No masses. EXTREMITIES: Without any cyanosis, clubbing, rash, lesions or edema. NEUROLOGIC: AOx3, no focal motor deficit. 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  Will proceed with EGD.  Dolores Frame, MD 02/08/2023, 9:54 AM

## 2023-02-08 NOTE — Transfer of Care (Signed)
Immediate Anesthesia Transfer of Care Note  Patient: Kristi Weiss  Procedure(s) Performed: ESOPHAGOGASTRODUODENOSCOPY (EGD) WITH PROPOFOL BIOPSY  Patient Location: Endoscopy Unit  Anesthesia Type:General  Level of Consciousness: drowsy  Airway & Oxygen Therapy: Patient Spontanous Breathing  Post-op Assessment: Report given to RN and Post -op Vital signs reviewed and stable  Post vital signs: Reviewed and stable  Last Vitals:  Vitals Value Taken Time  BP    Temp    Pulse    Resp    SpO2      Last Pain:  Vitals:   02/08/23 0955  TempSrc:   PainSc: 3       Patients Stated Pain Goal: 6 (02/08/23 0907)  Complications: No notable events documented.

## 2023-02-08 NOTE — Discharge Instructions (Signed)
You are being discharged to home.  Resume your previous diet.  We are waiting for your pathology results.  Continue your present medications.  Do not take any high dose aspirin, ibuprofen (including Advil, Motrin or Nuprin), naproxen (including Aleve), or any other non-steroidal anti-inflammatory drugs.

## 2023-02-09 LAB — SURGICAL PATHOLOGY

## 2023-02-09 NOTE — Anesthesia Postprocedure Evaluation (Signed)
Anesthesia Post Note  Patient: Kristi Weiss  Procedure(s) Performed: ESOPHAGOGASTRODUODENOSCOPY (EGD) WITH PROPOFOL BIOPSY  Patient location during evaluation: Phase II Anesthesia Type: General Level of consciousness: awake Pain management: pain level controlled Vital Signs Assessment: post-procedure vital signs reviewed and stable Respiratory status: spontaneous breathing and respiratory function stable Cardiovascular status: blood pressure returned to baseline and stable Postop Assessment: no headache and no apparent nausea or vomiting Anesthetic complications: no Comments: Late entry   No notable events documented.   Last Vitals:  Vitals:   02/08/23 1012 02/08/23 1015  BP: (!) 102/55 109/69  Pulse: 66 75  Resp: 14 18  Temp: 36.8 C   SpO2: 98% 98%    Last Pain:  Vitals:   02/08/23 1015  TempSrc:   PainSc: 0-No pain                 Windell Norfolk

## 2023-02-16 ENCOUNTER — Encounter (HOSPITAL_COMMUNITY): Payer: Self-pay | Admitting: Gastroenterology

## 2023-02-16 NOTE — Progress Notes (Signed)
Mitzie: Can you have this patient follow up in the GI clinic in 3-4 weeks with Dr Levon Hedger or any APP  Kenney Houseman : Can you please schedule CT Abdomen and Pelvis with IV contrast . Diagnosis : Abdominal pain   I reviewed the pathology results. Ann, can you send her a letter with the findings as described below please?  Thanks,  Vista Lawman, MD Gastroenterology and Hepatology [facility]  ---------------------------------------------------------------------------------------------  [facility] [address], [address], [place], [place] [address] Phone:  [phone]   02/16/23 [place], [place]   Dear Wilhemina Cash,  I am writing to inform you that the biopsies taken during your recent endoscopic examination showed:  No H. Pylori bacteria in stomach , or any early cancer changes to the stomach mucosa ( Intestinal metaplasia)   As this upper endoscopy does not explain the cause of your abdominal discomfort, we will schedule CT abdomen pelvis  Please call us at [phone] if you have persistent problems or have questions about your condition that have not been fully answered at this time.

## 2023-02-18 ENCOUNTER — Encounter (INDEPENDENT_AMBULATORY_CARE_PROVIDER_SITE_OTHER): Payer: Self-pay | Admitting: *Deleted

## 2023-02-19 ENCOUNTER — Other Ambulatory Visit (INDEPENDENT_AMBULATORY_CARE_PROVIDER_SITE_OTHER): Payer: Self-pay | Admitting: Gastroenterology

## 2023-02-19 DIAGNOSIS — G8929 Other chronic pain: Secondary | ICD-10-CM

## 2023-03-15 ENCOUNTER — Ambulatory Visit (HOSPITAL_COMMUNITY): Payer: PPO

## 2023-03-25 ENCOUNTER — Ambulatory Visit (INDEPENDENT_AMBULATORY_CARE_PROVIDER_SITE_OTHER): Payer: PPO | Admitting: Gastroenterology

## 2023-03-29 DIAGNOSIS — E538 Deficiency of other specified B group vitamins: Secondary | ICD-10-CM | POA: Diagnosis not present

## 2023-03-29 DIAGNOSIS — E119 Type 2 diabetes mellitus without complications: Secondary | ICD-10-CM | POA: Diagnosis not present

## 2023-04-02 ENCOUNTER — Telehealth (INDEPENDENT_AMBULATORY_CARE_PROVIDER_SITE_OTHER): Payer: Self-pay | Admitting: Otolaryngology

## 2023-04-02 NOTE — Telephone Encounter (Signed)
Left vm to confirm appt and address for 04/05/2023.

## 2023-04-05 ENCOUNTER — Ambulatory Visit (INDEPENDENT_AMBULATORY_CARE_PROVIDER_SITE_OTHER): Payer: PPO | Admitting: Otolaryngology

## 2023-04-05 VITALS — BP 126/86 | HR 69

## 2023-04-05 DIAGNOSIS — H6121 Impacted cerumen, right ear: Secondary | ICD-10-CM | POA: Diagnosis not present

## 2023-04-05 DIAGNOSIS — Z08 Encounter for follow-up examination after completed treatment for malignant neoplasm: Secondary | ICD-10-CM

## 2023-04-05 DIAGNOSIS — Z8589 Personal history of malignant neoplasm of other organs and systems: Secondary | ICD-10-CM | POA: Diagnosis not present

## 2023-04-05 NOTE — Progress Notes (Unsigned)
Patient ID: Kristi Weiss, female   DOB: 04/01/1945, 78 y.o.   MRN: [id number]  Follow-up: Left tonsillar squamous cell carcinoma  HPI: The patient is a 78 year old female who returns today for her follow-up evaluation.  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  She completed her chemoradiation treatment in August 2021.  According to the patient, she has noted occasional sore throat.  She denies any significant dysphagia or dyspnea.  She is otherwise doing well.  Exam: General: Communicates without difficulty, well nourished, no acute distress. Head: Normocephalic, no evidence injury, no tenderness, facial buttresses intact without stepoff. Face/sinus: No tenderness to palpation and percussion. Facial movement is normal and symmetric. Eyes: PERRL, EOMI. No scleral icterus, conjunctivae clear. Neuro: CN II exam reveals vision grossly intact.  No nystagmus at any point of gaze. Ears: Auricles well formed without lesions.  Right ear cerumen impaction.  The left ear and tympanic membrane are normal.  Nose: External evaluation reveals normal support and skin without lesions.  Dorsum is intact.  Anterior rhinoscopy reveals congested mucosa over anterior aspect of inferior turbinates and intact septum.  No purulence noted. Oral:  Oral cavity and oropharynx are intact, symmetric, without erythema or edema.  Mucosa is moist without lesions. Neck: Full range of motion without pain.  There is no significant lymphadenopathy.  No masses palpable.  Thyroid bed within normal limits to palpation.  Parotid glands and submandibular glands equal bilaterally without mass.  Trachea is midline. Neuro:  CN 2-12 grossly intact.    Procedure: Right ear cerumen disimpaction Anesthesia: None Description: Under the operating microscope, the cerumen is carefully removed with a combination of cerumen currette, alligator forceps, and suction catheters.  After  the cerumen is removed, the TMs are noted to be normal.  No mass, erythema, or lesions. The patient tolerated the procedure well.    Assessment: 1.  Right ear cerumen impaction.  After the disimpaction procedure, both tympanic membranes and middle ear spaces are noted to be normal. 2.  History of metastatic left tonsillar squamous cell carcinoma. The patient has completed her chemo/radiation treatments. No recurrent mass or infection is noted today on today's exam.   Plan: 1.  Otomicroscopy with right ear cerumen disimpaction. 2.  The physical exam findings are reviewed with the patient. 3.  The patient is reassured that no recurrent disease or infection is noted today. 4.  The patient will return for reevaluation in 6 months.

## 2023-04-06 DIAGNOSIS — H6121 Impacted cerumen, right ear: Secondary | ICD-10-CM | POA: Insufficient documentation

## 2023-04-06 DIAGNOSIS — Z8589 Personal history of malignant neoplasm of other organs and systems: Secondary | ICD-10-CM | POA: Insufficient documentation

## 2023-04-15 DIAGNOSIS — M542 Cervicalgia: Secondary | ICD-10-CM | POA: Diagnosis not present

## 2023-04-15 DIAGNOSIS — E663 Overweight: Secondary | ICD-10-CM | POA: Diagnosis not present

## 2023-04-15 DIAGNOSIS — Z6828 Body mass index (BMI) 28.0-28.9, adult: Secondary | ICD-10-CM | POA: Diagnosis not present

## 2023-04-27 ENCOUNTER — Ambulatory Visit (HOSPITAL_COMMUNITY)
Admission: RE | Admit: 2023-04-27 | Discharge: 2023-04-27 | Disposition: A | Discharge status: Home / Self Care | Payer: PPO | Source: Ambulatory Visit | Attending: Family Medicine | Admitting: Family Medicine

## 2023-04-27 ENCOUNTER — Other Ambulatory Visit (HOSPITAL_COMMUNITY): Payer: Self-pay | Admitting: Family Medicine

## 2023-04-27 DIAGNOSIS — R519 Headache, unspecified: Secondary | ICD-10-CM | POA: Insufficient documentation

## 2023-04-27 DIAGNOSIS — M542 Cervicalgia: Secondary | ICD-10-CM

## 2023-04-27 DIAGNOSIS — Z6828 Body mass index (BMI) 28.0-28.9, adult: Secondary | ICD-10-CM | POA: Diagnosis not present

## 2023-04-27 DIAGNOSIS — E663 Overweight: Secondary | ICD-10-CM | POA: Diagnosis not present

## 2023-04-27 DIAGNOSIS — I6782 Cerebral ischemia: Secondary | ICD-10-CM | POA: Diagnosis not present

## 2023-04-28 DIAGNOSIS — I10 Essential (primary) hypertension: Secondary | ICD-10-CM | POA: Diagnosis not present

## 2023-04-28 DIAGNOSIS — R519 Headache, unspecified: Secondary | ICD-10-CM | POA: Diagnosis not present

## 2023-04-28 DIAGNOSIS — M542 Cervicalgia: Secondary | ICD-10-CM | POA: Diagnosis not present

## 2023-04-29 ENCOUNTER — Other Ambulatory Visit (HOSPITAL_COMMUNITY): Payer: Self-pay | Admitting: Internal Medicine

## 2023-04-29 DIAGNOSIS — Z1231 Encounter for screening mammogram for malignant neoplasm of breast: Secondary | ICD-10-CM

## 2023-05-03 DIAGNOSIS — E663 Overweight: Secondary | ICD-10-CM | POA: Diagnosis not present

## 2023-05-03 DIAGNOSIS — Z6828 Body mass index (BMI) 28.0-28.9, adult: Secondary | ICD-10-CM | POA: Diagnosis not present

## 2023-05-03 DIAGNOSIS — M542 Cervicalgia: Secondary | ICD-10-CM | POA: Diagnosis not present

## 2023-05-05 ENCOUNTER — Ambulatory Visit (HOSPITAL_COMMUNITY)
Admission: RE | Admit: 2023-05-05 | Discharge: 2023-05-05 | Disposition: A | Discharge status: Home / Self Care | Payer: PPO | Source: Ambulatory Visit | Attending: Internal Medicine | Admitting: Internal Medicine

## 2023-05-05 ENCOUNTER — Ambulatory Visit (HOSPITAL_COMMUNITY)
Admission: RE | Admit: 2023-05-05 | Discharge: 2023-05-05 | Disposition: A | Discharge status: Home / Self Care | Payer: PPO | Source: Ambulatory Visit | Attending: Family Medicine | Admitting: Family Medicine

## 2023-05-05 DIAGNOSIS — Z1231 Encounter for screening mammogram for malignant neoplasm of breast: Secondary | ICD-10-CM | POA: Insufficient documentation

## 2023-05-05 DIAGNOSIS — R519 Headache, unspecified: Secondary | ICD-10-CM | POA: Insufficient documentation

## 2023-05-05 DIAGNOSIS — M542 Cervicalgia: Secondary | ICD-10-CM | POA: Diagnosis not present

## 2023-05-05 DIAGNOSIS — M47812 Spondylosis without myelopathy or radiculopathy, cervical region: Secondary | ICD-10-CM | POA: Diagnosis not present

## 2023-05-05 DIAGNOSIS — M5021 Other cervical disc displacement,  high cervical region: Secondary | ICD-10-CM | POA: Diagnosis not present

## 2023-05-05 DIAGNOSIS — M4802 Spinal stenosis, cervical region: Secondary | ICD-10-CM | POA: Diagnosis not present

## 2023-05-05 DIAGNOSIS — M50321 Other cervical disc degeneration at C4-C5 level: Secondary | ICD-10-CM | POA: Diagnosis not present

## 2023-05-21 DIAGNOSIS — E538 Deficiency of other specified B group vitamins: Secondary | ICD-10-CM | POA: Diagnosis not present

## 2023-06-24 DIAGNOSIS — M25512 Pain in left shoulder: Secondary | ICD-10-CM | POA: Diagnosis not present

## 2023-06-24 DIAGNOSIS — M47812 Spondylosis without myelopathy or radiculopathy, cervical region: Secondary | ICD-10-CM | POA: Diagnosis not present

## 2023-07-20 ENCOUNTER — Ambulatory Visit (HOSPITAL_COMMUNITY)
Admission: RE | Admit: 2023-07-20 | Discharge: 2023-07-20 | Disposition: A | Discharge status: Home / Self Care | Source: Ambulatory Visit | Attending: Internal Medicine | Admitting: Internal Medicine

## 2023-07-20 ENCOUNTER — Other Ambulatory Visit (HOSPITAL_COMMUNITY): Payer: Self-pay | Admitting: Internal Medicine

## 2023-07-20 DIAGNOSIS — M25712 Osteophyte, left shoulder: Secondary | ICD-10-CM | POA: Diagnosis not present

## 2023-07-20 DIAGNOSIS — M542 Cervicalgia: Secondary | ICD-10-CM

## 2023-07-20 DIAGNOSIS — M25512 Pain in left shoulder: Secondary | ICD-10-CM | POA: Diagnosis not present

## 2023-07-20 DIAGNOSIS — M19012 Primary osteoarthritis, left shoulder: Secondary | ICD-10-CM | POA: Diagnosis not present

## 2023-07-29 ENCOUNTER — Other Ambulatory Visit: Payer: Self-pay

## 2023-07-29 ENCOUNTER — Ambulatory Visit (HOSPITAL_COMMUNITY): Attending: Neurosurgery

## 2023-07-29 ENCOUNTER — Encounter (HOSPITAL_COMMUNITY): Payer: Self-pay

## 2023-07-29 DIAGNOSIS — M9902 Segmental and somatic dysfunction of thoracic region: Secondary | ICD-10-CM | POA: Insufficient documentation

## 2023-07-29 DIAGNOSIS — M4123 Other idiopathic scoliosis, cervicothoracic region: Secondary | ICD-10-CM | POA: Insufficient documentation

## 2023-07-29 DIAGNOSIS — M25612 Stiffness of left shoulder, not elsewhere classified: Secondary | ICD-10-CM | POA: Insufficient documentation

## 2023-07-29 NOTE — Therapy (Signed)
 OUTPATIENT PHYSICAL THERAPY SPINE/SHOULDER EVALUATION   Patient Name: Kristi Weiss MRN: [id number] DOB:November 14, 1945, 78 y.o., female Today's Date: 07/29/2023  END OF SESSION:  PT End of Session - 07/29/23 1035     Visit Number 1    Date for PT Re-Evaluation 08/26/23    Authorization Type [employer]    Authorization Time Period no auth no limit    Authorization - Visit Number 0    Progress Note Due on Visit 8    PT Start Time 0845    PT Stop Time 0930    PT Time Calculation (min) 45 min    Activity Tolerance Patient tolerated treatment well;Other (comment)   limited by structural deformity   Behavior During Therapy [facility] for tasks assessed/performed             Past Medical History:  Diagnosis Date   Anxiety about health 08/23/2019   Arthritis    Atypical mole 06/30/2001   features of dys nevus- upper left back    Atypical mole 04/04/2020   mod-left upper arm -   Complication of anesthesia    Complication of anesthesia    throat raw from radiation   GERD (gastroesophageal reflux disease)    Hypothyroidism    Hypothyroidism (acquired) 08/23/2019   Melanocytic nevus with features of Dysplastic Nevus 06/30/2001   Upper Left Back   Port-A-Cath in place 08/09/2019   SCC (squamous cell carcinoma) 04/12/2012   Left Forehead (Cx3,5FU)   SCC (squamous cell carcinoma) 03/16/2018   left upper cheek cx3 78fu   Scoliosis    Sinusitis    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   Past Surgical History:  Procedure Laterality Date   BIOPSY  02/08/2023   Procedure: BIOPSY;  Surgeon: Urban Garden, MD;  Location: [facility];  Service: Gastroenterology;;   ESOPHAGOGASTRODUODENOSCOPY (EGD) WITH PROPOFOL  N/A 02/08/2023   Procedure: ESOPHAGOGASTRODUODENOSCOPY (EGD) WITH PROPOFOL ;  Surgeon: Urban Garden, MD;   Location: [facility];  Service: Gastroenterology;  Laterality: N/A;  1:00PM;ASA 3   NO PAST SURGERIES     No prior surgery     PORT-A-CATH REMOVAL Right 07/29/2022   Procedure: MINOR REMOVAL PORT-A-CATH;  Surgeon: Alanda Allegra, MD;  Location: [facility];  Service: General;  Laterality: Right;   PORTACATH PLACEMENT Right 08/01/2019   Procedure: INSERTION PORT-A-CATH;  Surgeon: Alanda Allegra, MD;  Location: [facility];  Service: General;  Laterality: Right;   Patient Active Problem List   Diagnosis Date Noted   Impacted cerumen of right ear 04/06/2023   Personal history of malignant neoplasm of head and neck 04/06/2023   Abdominal pain, epigastric 02/08/2023   Abdominal pain, chronic, epigastric 03/19/2021   GERD (gastroesophageal reflux disease) 03/19/2021   Dysgeusia 03/19/2021   Acquired lymphedema 08/28/2020   Left ear hearing loss 08/28/2020   Hypothyroidism (acquired) 08/23/2019   Anxiety about health 08/23/2019   Dehydration 08/17/2019   Port-A-Cath in place 08/09/2019   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   Chronic tension-type headache, not intractable 05/30/2019   Difficulty walking 05/22/2013   Arthritis of knee, degenerative 05/18/2013    PCP: Kathyleen Parkins, MD   REFERRING PROVIDER: Agustina Aldrich, MD  REFERRING DIAG: [phone] (ICD-10-CM) - Spondylosis without myelopathy or radiculopathy, cervical region  THERAPY DIAG:  Other idiopathic scoliosis, cervicothoracic region  Stiffness  of left shoulder joint  Somatic dysfunction of scapulothoracic joint  Rationale for Evaluation and Treatment: Rehabilitation  ONSET DATE: End February 2025  SUBJECTIVE:                                                                                                                                                                                                         SUBJECTIVE STATEMENT: Pt states it started with a crick in her neck and intense headache that lasted 6  weeks. Headache has now subsided. Pt states she has also noticed increased difficulty with abducting left shoulder. Pt states doc said she had some arthritis in neck. Pts main concern is mobility of left shoulder and back musculature which she feels is in knots due to scoliosis. Pt was really concerned as the inability to lift left shoulder to the side was a sudden change.  Hand dominance: Right  PERTINENT HISTORY:  -Radiation to left side of neck due to cancer treatment  -Cancer in [place], cancer free now -Cancer treatment in 2021  PAIN:  Are you having pain? Yes: NPRS scale: 5/10 Pain location: left shoulder, chest back Pain description: pulling, feels like something is out of place Aggravating factors: lifting left arm to the side Relieving factors: raising in the front  PRECAUTIONS: None  RED FLAGS: None     WEIGHT BEARING RESTRICTIONS: No  FALLS:  Has patient fallen in last 6 months? No  OCCUPATION: retired  PLOF: Independent  PATIENT GOALS: being able to lift left arm, get back to normal, improve overhead capabilities  NEXT MD VISIT: 6 months  OBJECTIVE:  Note: Objective measures were completed at Evaluation unless otherwise noted.  DIAGNOSTIC FINDINGS:  CLINICAL DATA:  Provided history: Occipital headache. Neck pain on left side.   EXAM: MRI CERVICAL SPINE WITHOUT CONTRAST   TECHNIQUE: Multiplanar, multisequence MR imaging of the cervical spine was performed. No intravenous contrast was administered.   COMPARISON:  Neck CT 12/16/2022.   FINDINGS: Alignment: No significant spondylolisthesis.   Vertebrae: Cervical vertebral body height is maintained. Multilevel degenerative endplate irregularity. Mild degenerative endplate edema at V4-U9, C4-C5, C5-C6, C6-C7 and C7-T1.   Cord: No signal abnormality identified within the cervical spinal cord.   Posterior Fossa, vertebral arteries, paraspinal tissues: No acute finding within included portions of the  posterior fossa. Flow voids preserved within the imaged cervical vertebral arteries. No paraspinal mass or collection.   Disc levels:   Multilevel disc degeneration, greatest at C4-C5, C6-C7 and C7-T1 (advanced at these levels).   C2-C3: Shallow broad-based central disc protrusion. Facet arthropathy.  Mild ligamentum flavum thickening. No significant spinal canal or foraminal stenosis.   C3-C4: Shallow disc bulge with right greater than left uncovertebral hypertrophy. Facet arthropathy. Mild ligamentum flavum thickening. Mild relative spinal canal narrowing. Bilateral neural foraminal narrowing (moderate/severe right, mild left).   C4-C5: Posterior disc osteophyte complex with right greater than left disc osteophyte ridge/uncinate hypertrophy. Facet arthropathy. Mild spinal canal narrowing. Bilateral neural foraminal narrowing (severe right, moderate/severe left).   C5-C6: Disc bulge with bilateral disc osteophyte ridge/uncinate hypertrophy. Facet arthropathy. Mild ligamentum flavum thickening. Mild spinal canal stenosis. Severe bilateral neural foraminal narrowing.   C6-C7: Slight disc bulge. Left-sided disc osteophyte ridge/uncinate hypertrophy. No significant spinal canal stenosis. Severe left neural foraminal narrowing.   C7-T1: Posterior disc osteophyte complex with bilateral disc osteophyte ridge/uncinate hypertrophy. Mild ligamentum flavum thickening. Mild relative spinal canal narrowing. Mild/moderate bilateral neural foraminal narrowing.   IMPRESSION: 1. Cervical spondylosis as outlined within the body of the report. 2. No more than mild spinal canal stenosis. 3. Multilevel foraminal stenosis, greatest on the right at C3-C4 (moderate/severe), bilaterally at C4-C5 (severe right, moderate/severe left), bilaterally at C5-C6 (severe and on the left at C6-C7 (severe). 4. Disc degeneration is greatest at C4-C5, C6-C7 and C7-T1 (advanced at these levels). 5. Mild  multilevel degenerative endplate edema.  CLINICAL DATA:  Left shoulder pain after having neck pain.   EXAM: LEFT SHOULDER - 2+ VIEW   COMPARISON:  None Available.   FINDINGS: Mild glenohumeral joint space narrowing. Mild-to-moderate inferior, anterior and posterior glenoid and inferior humeral head-neck junction degenerative osteophytes. Mild acromioclavicular joint space narrowing and peripheral osteophytosis with a small chronic ossicle at the superior aspect of the joint. No acute fracture or dislocation.   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  PATIENT SURVEYS:    COGNITION: Overall cognitive status: Within functional limits for tasks assessed  SENSATION: WFL  POSTURE: rounded shoulders, forward head, increased thoracic kyphosis, flexed trunk , and significant alterations of normal anatomical structure of spine, bilateral shoulder blades and shoulders  PALPATION: Pt demonstrates increased tenderness to palpation of cervical, thoracic, and thoracolumbar musculature. Significant abnormal alignment of spine, scapulae, and shoulders noted and muscle imbalance in across back regions noted.    UPPER EXTREMITY ROM:  Active ROM Right eval Left eval  Shoulder flexion 134 110 active, 127 AAROM  Shoulder extension 95 51  Shoulder abduction 145 60 AAROM  Shoulder adduction good More pain minimal limitation  Shoulder extension    Shoulder internal rotation  limited  Shoulder external rotation    Elbow flexion    Elbow extension    Wrist flexion    Wrist extension    Wrist ulnar deviation    Wrist radial deviation    Wrist pronation    Wrist supination     (Blank rows = not tested)  UPPER EXTREMITY MMT:  MMT Right eval Left eval  Shoulder flexion 4+ 4  Shoulder extension 4+ 4  Shoulder abduction 4+ 4  Shoulder adduction 4+ 4  Shoulder extension    Shoulder internal rotation    Shoulder external rotation     Middle trapezius TBA TBA  Lower trapezius TBA TBA  Elbow flexion 4 4  Elbow extension 4 4  Wrist flexion    Wrist extension    Wrist ulnar deviation    Wrist radial deviation    Wrist pronation    Wrist supination    Grip strength 4 4   (Blank rows = not tested)   FUNCTIONAL TESTS:  TREATMENT DATE:  07/29/2023   Evaluation: -ROM measured, Strength assessed, HEP prescribed, pt educated on prognosis, findings, and importance of HEP compliance if given.   Therapeutic Exercise: -Scapular retractions, 1 set of 5 reps, 3 second holds -Towel wall slides, L shoulder flexion, 5 reps, 10 second holds                                                                                                                                  PATIENT EDUCATION:  Education details: Pt was educated on findings of PT evaluation, prognosis, frequency of therapy visits and rationale, attendance policy, and HEP if given.   Person educated: Patient Education method: Explanation, Verbal cues, and Handouts Education comprehension: verbalized understanding, verbal cues required, and needs further education  HOME EXERCISE PROGRAM: Access Code: [email or web address] URL: [email or web address] Date: 07/29/2023 Prepared by: Armond Bertin  Exercises - Seated Scapular Retraction  - 1 x daily - 7 x weekly - 3 sets - 10 reps - Shoulder Flexion Wall Slide with Towel  - 1 x daily - 7 x weekly - 3 sets - 10 reps  ASSESSMENT:  CLINICAL IMPRESSION: Patient is a 78 y.o. female who was seen today for physical therapy evaluation and treatment for M47.812 (ICD-10-CM) - Spondylosis without myelopathy or radiculopathy, cervical region. Patient demonstrates abnormal anatomical structure in spine, scapula and shoulder girdles. Pt also demonstrates decreased left shoulder ROM, muscular imbalance in multiple areas of thoracolumbar spine, and increased tension in bilateral upper trapezius and paraspinals of cervical  spine. Patient also demonstrates scapular winging of R scapula and abnormal upwards rotation of bilateral scapula.  Patient unable to correct abnormal posture with verbal cuing. Patient would benefit from skilled physical therapy for decreased muscle imbalance in paraspinal musculature, increased strength in paraspinal and parascapular musculature, and improved posture for improved ability to perform yard work without symptom reproduction, return to higher level of function with ADLs, and progress towards therapy goals.    OBJECTIVE IMPAIRMENTS: decreased activity tolerance, decreased coordination, decreased endurance, decreased mobility, decreased ROM, decreased strength, hypomobility, impaired flexibility, and postural dysfunction.   ACTIVITY LIMITATIONS: carrying, lifting, bending, sitting, standing, squatting, transfers, bed mobility, and reach over head  PARTICIPATION LIMITATIONS: meal prep, cleaning, laundry, shopping, community activity, and yard work  PERSONAL FACTORS: Age, Time since onset of injury/illness/exacerbation, and 1-2 comorbidities: scoliosis, history of cancer are also affecting patient's functional outcome.   REHAB POTENTIAL: Fair co morbidities  CLINICAL DECISION MAKING: Evolving/moderate complexity  EVALUATION COMPLEXITY: Moderate   GOALS: Goals reviewed with patient? No  SHORT TERM GOALS: Target date: 08/19/23  Pt will be independent with HEP in order to demonstrate participation in Physical Therapy POC.  Baseline: Goal status: INITIAL  2.  Pt will report 3/10 pain with left shoulder mobility in order to demonstrate improved pain with ADLs.  Baseline:  Goal status: INITIAL  LONG TERM GOALS: Target date: 09/09/23  Pt will report decreased knots in cervical/thoracolumbar spinal  musculature in order to demonstrate improved functional mobility and decreased rest breaks during ADL.  Baseline: see objective.  Goal status: INITIAL  2.  Pt will improve left  shoulder ROM (flex/ext/lateral flexion/rotation) by combined 20 degrees in order to demonstrate improved functional ambulatory capacity in community setting.  Baseline: see objective.  Goal status: INITIAL  3.  Pt will improve NDI score by at least 11.75 points in order to demonstrate decreased pain with functional goals and outcomes. Baseline: see objective.  Goal status: INITIAL  4.  Pt will report 1/10 pain with left shoulder/scapulothoracic mobility in order to demonstrate reduced pain with ADLs that require over head ability.  Baseline: see objective.  Goal status: INITIAL     PLAN:  PT FREQUENCY: 1-2x/week  PT DURATION: 6 weeks  PLANNED INTERVENTIONS: 97110-Therapeutic exercises, 97530- Therapeutic activity, 97112- Neuromuscular re-education, 97535- Self Care, 16109- Manual therapy, [phone]- Gait training, Patient/Family education, Dry Needling, Joint mobilization, Spinal mobilization, DME instructions, Cryotherapy, and Moist heat  PLAN FOR NEXT SESSION: review goals and HEP, progress left scapulothoracic mobility, seek counsel for referral to scoliosis specialist or reach out to referring as this seems to be the root cause of lack of mobility.   Armond Bertin, PT, DPT [facility] Office: [phone] 11:49 AM, 07/29/23

## 2023-08-02 DIAGNOSIS — H40003 Preglaucoma, unspecified, bilateral: Secondary | ICD-10-CM | POA: Diagnosis not present

## 2023-08-03 ENCOUNTER — Encounter (HOSPITAL_COMMUNITY): Admitting: Physical Therapy

## 2023-08-03 ENCOUNTER — Encounter (HOSPITAL_COMMUNITY)

## 2023-08-09 ENCOUNTER — Ambulatory Visit (HOSPITAL_COMMUNITY): Attending: Neurosurgery

## 2023-08-09 ENCOUNTER — Encounter (HOSPITAL_COMMUNITY): Payer: Self-pay

## 2023-08-09 DIAGNOSIS — M9902 Segmental and somatic dysfunction of thoracic region: Secondary | ICD-10-CM | POA: Diagnosis not present

## 2023-08-09 DIAGNOSIS — M25612 Stiffness of left shoulder, not elsewhere classified: Secondary | ICD-10-CM

## 2023-08-09 DIAGNOSIS — M4123 Other idiopathic scoliosis, cervicothoracic region: Secondary | ICD-10-CM | POA: Diagnosis not present

## 2023-08-09 NOTE — Therapy (Signed)
 OUTPATIENT PHYSICAL THERAPY SPINE/SHOULDER EVALUATION   Patient Name: Kristi Weiss MRN: [id number] DOB:[birth year], 78 y.o., female Today's Date: 08/09/2023  END OF SESSION:  PT End of Session - 08/09/23 1233     Visit Number 2    Date for PT Re-Evaluation 08/26/23    Authorization Type [employer]    Authorization Time Period no auth no limit    Progress Note Due on Visit 8    PT Start Time 1153    PT Stop Time 1232    PT Time Calculation (min) 39 min    Activity Tolerance Patient tolerated treatment well;Other (comment)   limited by structural deformity   Behavior During Therapy [facility] for tasks assessed/performed              Past Medical History:  Diagnosis Date   Anxiety about health 08/23/2019   Arthritis    Atypical mole 06/30/2001   features of dys nevus- upper left back    Atypical mole 04/04/2020   mod-left upper arm -   Complication of anesthesia    Complication of anesthesia    throat raw from radiation   GERD (gastroesophageal reflux disease)    Hypothyroidism    Hypothyroidism (acquired) 08/23/2019   Melanocytic nevus with features of Dysplastic Nevus 06/30/2001   Upper Left Back   Port-A-Cath in place 08/09/2019   SCC (squamous cell carcinoma) 04/12/2012   Left Forehead (Cx3,5FU)   SCC (squamous cell carcinoma) 03/16/2018   left upper cheek cx3 36fu   Scoliosis    Sinusitis    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   Past Surgical History:  Procedure Laterality Date   BIOPSY  02/08/2023   Procedure: BIOPSY;  Surgeon: Urban Garden, MD;  Location: [facility];  Service: Gastroenterology;;   ESOPHAGOGASTRODUODENOSCOPY (EGD) WITH PROPOFOL  N/A 02/08/2023   Procedure: ESOPHAGOGASTRODUODENOSCOPY (EGD) WITH PROPOFOL ;  Surgeon: Urban Garden, MD;  Location: [facility];  Service:  Gastroenterology;  Laterality: N/A;  1:00PM;ASA 3   NO PAST SURGERIES     No prior surgery     PORT-A-CATH REMOVAL Right 07/29/2022   Procedure: MINOR REMOVAL PORT-A-CATH;  Surgeon: Alanda Allegra, MD;  Location: [facility];  Service: General;  Laterality: Right;   PORTACATH PLACEMENT Right 08/01/2019   Procedure: INSERTION PORT-A-CATH;  Surgeon: Alanda Allegra, MD;  Location: [facility];  Service: General;  Laterality: Right;   Patient Active Problem List   Diagnosis Date Noted   Impacted cerumen of right ear 04/06/2023   Personal history of malignant neoplasm of head and neck 04/06/2023   Abdominal pain, epigastric 02/08/2023   Abdominal pain, chronic, epigastric 03/19/2021   GERD (gastroesophageal reflux disease) 03/19/2021   Dysgeusia 03/19/2021   Acquired lymphedema 08/28/2020   Left ear hearing loss 08/28/2020   Hypothyroidism (acquired) 08/23/2019   Anxiety about health 08/23/2019   Dehydration 08/17/2019   Port-A-Cath in place 08/09/2019   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   Chronic tension-type headache, not intractable 05/30/2019   Difficulty walking 05/22/2013   Arthritis of knee, degenerative 05/18/2013    PCP: Kathyleen Parkins, MD  REFERRING PROVIDER: Agustina Aldrich, MD  REFERRING DIAG: [phone] (ICD-10-CM) - Spondylosis without myelopathy or radiculopathy, cervical region  THERAPY DIAG:  Other idiopathic scoliosis, cervicothoracic region  Stiffness of left shoulder joint  Somatic dysfunction of  scapulothoracic joint  Rationale for Evaluation and Treatment: Rehabilitation  ONSET DATE: End February 2025  SUBJECTIVE:                                                                                                                                                                                                         SUBJECTIVE STATEMENT: Pt reports having headache currently. Pt continues to endorse main concern of limited L shoulder elevation.   Hand  dominance: Right  PERTINENT HISTORY:  -Radiation to left side of neck due to cancer treatment  -Cancer in [place], cancer free now -Cancer treatment in 2021  PAIN:  Are you having pain? Yes: NPRS scale: 5/10 Pain location: left shoulder, chest back Pain description: pulling, feels like something is out of place Aggravating factors: lifting left arm to the side Relieving factors: raising in the front  PRECAUTIONS: None  RED FLAGS: None     WEIGHT BEARING RESTRICTIONS: No  FALLS:  Has patient fallen in last 6 months? No  OCCUPATION: retired  PLOF: Independent  PATIENT GOALS: being able to lift left arm, get back to normal, improve overhead capabilities  NEXT MD VISIT: 6 months  OBJECTIVE:  Note: Objective measures were completed at Evaluation unless otherwise noted.  DIAGNOSTIC FINDINGS:  CLINICAL DATA:  Provided history: Occipital headache. Neck pain on left side.   EXAM: MRI CERVICAL SPINE WITHOUT CONTRAST   TECHNIQUE: Multiplanar, multisequence MR imaging of the cervical spine was performed. No intravenous contrast was administered.   COMPARISON:  Neck CT 12/16/2022.   FINDINGS: Alignment: No significant spondylolisthesis.   Vertebrae: Cervical vertebral body height is maintained. Multilevel degenerative endplate irregularity. Mild degenerative endplate edema at U9-W1, C4-C5, C5-C6, C6-C7 and C7-T1.   Cord: No signal abnormality identified within the cervical spinal cord.   Posterior Fossa, vertebral arteries, paraspinal tissues: No acute finding within included portions of the posterior fossa. Flow voids preserved within the imaged cervical vertebral arteries. No paraspinal mass or collection.   Disc levels:   Multilevel disc degeneration, greatest at C4-C5, C6-C7 and C7-T1 (advanced at these levels).   C2-C3: Shallow broad-based central disc protrusion. Facet arthropathy. Mild ligamentum flavum thickening. No significant spinal canal or  foraminal stenosis.   C3-C4: Shallow disc bulge with right greater than left uncovertebral hypertrophy. Facet arthropathy. Mild ligamentum flavum thickening. Mild relative spinal canal narrowing. Bilateral neural foraminal narrowing (moderate/severe right, mild left).   C4-C5: Posterior disc osteophyte complex with right greater than left disc osteophyte ridge/uncinate hypertrophy. Facet arthropathy. Mild spinal canal narrowing. Bilateral neural foraminal narrowing (severe right, moderate/severe  left).   C5-C6: Disc bulge with bilateral disc osteophyte ridge/uncinate hypertrophy. Facet arthropathy. Mild ligamentum flavum thickening. Mild spinal canal stenosis. Severe bilateral neural foraminal narrowing.   C6-C7: Slight disc bulge. Left-sided disc osteophyte ridge/uncinate hypertrophy. No significant spinal canal stenosis. Severe left neural foraminal narrowing.   C7-T1: Posterior disc osteophyte complex with bilateral disc osteophyte ridge/uncinate hypertrophy. Mild ligamentum flavum thickening. Mild relative spinal canal narrowing. Mild/moderate bilateral neural foraminal narrowing.   IMPRESSION: 1. Cervical spondylosis as outlined within the body of the report. 2. No more than mild spinal canal stenosis. 3. Multilevel foraminal stenosis, greatest on the right at C3-C4 (moderate/severe), bilaterally at C4-C5 (severe right, moderate/severe left), bilaterally at C5-C6 (severe and on the left at C6-C7 (severe). 4. Disc degeneration is greatest at C4-C5, C6-C7 and C7-T1 (advanced at these levels). 5. Mild multilevel degenerative endplate edema.  CLINICAL DATA:  Left shoulder pain after having neck pain.   EXAM: LEFT SHOULDER - 2+ VIEW   COMPARISON:  None Available.   FINDINGS: Mild glenohumeral joint space narrowing. Mild-to-moderate inferior, anterior and posterior glenoid and inferior humeral head-neck junction degenerative osteophytes. Mild acromioclavicular  joint space narrowing and peripheral osteophytosis with a small chronic ossicle at the superior aspect of the joint. No acute fracture or dislocation.   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  PATIENT SURVEYS:    COGNITION: Overall cognitive status: Within functional limits for tasks assessed  SENSATION: WFL  POSTURE: rounded shoulders, forward head, increased thoracic kyphosis, flexed trunk , and significant alterations of normal anatomical structure of spine, bilateral shoulder blades and shoulders  PALPATION: Pt demonstrates increased tenderness to palpation of cervical, thoracic, and thoracolumbar musculature. Significant abnormal alignment of spine, scapulae, and shoulders noted and muscle imbalance in across back regions noted.    UPPER EXTREMITY ROM:  Active ROM Right eval Left eval  Shoulder flexion 134 110 active, 127 AAROM  Shoulder extension 95 51  Shoulder abduction 145 60 AAROM  Shoulder adduction good More pain minimal limitation  Shoulder extension    Shoulder internal rotation  limited  Shoulder external rotation    Elbow flexion    Elbow extension    Wrist flexion    Wrist extension    Wrist ulnar deviation    Wrist radial deviation    Wrist pronation    Wrist supination     (Blank rows = not tested)  UPPER EXTREMITY MMT:  MMT Right eval Left eval  Shoulder flexion 4+ 4  Shoulder extension 4+ 4  Shoulder abduction 4+ 4  Shoulder adduction 4+ 4  Shoulder extension    Shoulder internal rotation    Shoulder external rotation    Middle trapezius TBA TBA  Lower trapezius TBA TBA  Elbow flexion 4 4  Elbow extension 4 4  Wrist flexion    Wrist extension    Wrist ulnar deviation    Wrist radial deviation    Wrist pronation    Wrist supination    Grip strength 4 4   (Blank rows = not tested)   FUNCTIONAL TESTS:    TREATMENT DATE:  08/09/2023  -Supine AAROM shoulder flexion with dowel x  12 -Supine AAROM shoulder abduction with dowel x 12 -Supine scapular punches x 30  -Seated middle scapular MMT: R 4+  L4- -Prone scapular pushup at angle on mat table 2 x 12 -L shoulder shrug with 2lb DB 3'' isometric and eccentric control x 20-Mirror for visual cues  07/29/2023   Evaluation: -ROM measured, Strength assessed, HEP  prescribed, pt educated on prognosis, findings, and importance of HEP compliance if given.   Therapeutic Exercise: -Scapular retractions, 1 set of 5 reps, 3 second holds -Towel wall slides, L shoulder flexion, 5 reps, 10 second holds                                                                                                                                  PATIENT EDUCATION:  Education details: Pt was educated on findings of PT evaluation, prognosis, frequency of therapy visits and rationale, attendance policy, and HEP if given.   Person educated: Patient Education method: Explanation, Verbal cues, and Handouts Education comprehension: verbalized understanding, verbal cues required, and needs further education  HOME EXERCISE PROGRAM: Access Code: [email or web address] URL: [email or web address] Date: 08/09/2023 Prepared by: Irene Mannheim  Exercises - Seated Scapular Retraction  - 1 x daily - 7 x weekly - 3 sets - 10 reps - Shoulder Flexion Wall Slide with Towel  - 1 x daily - 7 x weekly - 3 sets - 10 reps - Seated Shoulder Shrug Circles AROM Backward  - 1 x daily - 7 x weekly - 3 sets - 10 reps - Push Up with Plus  - 1 x daily - 7 x weekly - 3 sets - 10 reps  ASSESSMENT:  CLINICAL IMPRESSION: Pt tolerated treatment session well with AAROM and low level neuromuscular re-education of scapular girdle musculature. Pt re-education focusing on proper scapular retraction and reducing scapula abduction with serratus anterior and upper trapezius training. Nocticable left upper trapezius atrophy could indicate poor glenohumeral motion due to  significantly poor scapular positioning. Patient would benefit from skilled physical therapy for decreased muscle imbalance in paraspinal musculature, increased strength in paraspinal and parascapular musculature, and improved posture for improved ability to perform yard work without symptom reproduction, return to higher level of function with ADLs, and progress towards therapy goals.    OBJECTIVE IMPAIRMENTS: decreased activity tolerance, decreased coordination, decreased endurance, decreased mobility, decreased ROM, decreased strength, hypomobility, impaired flexibility, and postural dysfunction.   ACTIVITY LIMITATIONS: carrying, lifting, bending, sitting, standing, squatting, transfers, bed mobility, and reach over head  PARTICIPATION LIMITATIONS: meal prep, cleaning, laundry, shopping, community activity, and yard work  PERSONAL FACTORS: Age, Time since onset of injury/illness/exacerbation, and 1-2 comorbidities: scoliosis, history of cancer are also affecting patient's functional outcome.   REHAB POTENTIAL: Fair co morbidities  CLINICAL DECISION MAKING: Evolving/moderate complexity  EVALUATION COMPLEXITY: Moderate   GOALS: Goals reviewed with patient? No  SHORT TERM GOALS: Target date: 08/19/23  Pt will be independent with HEP in order to demonstrate participation in Physical Therapy POC.  Baseline: Goal status: INITIAL  2.  Pt will report 3/10 pain with left shoulder mobility in order to demonstrate improved pain with ADLs.  Baseline:  Goal status: INITIAL  LONG TERM GOALS: Target date: 09/09/23  Pt will report decreased knots in cervical/thoracolumbar spinal musculature in order to demonstrate improved functional mobility and decreased  rest breaks during ADL.  Baseline: see objective.  Goal status: INITIAL  2.  Pt will improve left shoulder ROM (flex/ext/lateral flexion/rotation) by combined 20 degrees in order to demonstrate improved functional ambulatory capacity in  community setting.  Baseline: see objective.  Goal status: INITIAL  3.  Pt will improve NDI score by at least 11.75 points in order to demonstrate decreased pain with functional goals and outcomes. Baseline: see objective.  Goal status: INITIAL  4.  Pt will report 1/10 pain with left shoulder/scapulothoracic mobility in order to demonstrate reduced pain with ADLs that require over head ability.  Baseline: see objective.  Goal status: INITIAL     PLAN:  PT FREQUENCY: 1-2x/week  PT DURATION: 6 weeks  PLANNED INTERVENTIONS: 97110-Therapeutic exercises, 97530- Therapeutic activity, 97112- Neuromuscular re-education, 97535- Self Care, 09604- Manual therapy, [phone]- Gait training, Patient/Family education, Dry Needling, Joint mobilization, Spinal mobilization, DME instructions, Cryotherapy, and Moist heat  PLAN FOR NEXT SESSION: review goals and HEP, progress left scapulothoracic mobility, seek counsel for referral to scoliosis specialist or reach out to referring as this seems to be the root cause of lack of mobility.   Gatha Kaska PT, DPT [facility] [phone] office 12:34 PM, 08/09/23

## 2023-08-11 ENCOUNTER — Encounter (HOSPITAL_COMMUNITY)

## 2023-08-11 ENCOUNTER — Ambulatory Visit (HOSPITAL_COMMUNITY)

## 2023-08-11 DIAGNOSIS — M25612 Stiffness of left shoulder, not elsewhere classified: Secondary | ICD-10-CM

## 2023-08-11 DIAGNOSIS — M4123 Other idiopathic scoliosis, cervicothoracic region: Secondary | ICD-10-CM

## 2023-08-11 DIAGNOSIS — M9902 Segmental and somatic dysfunction of thoracic region: Secondary | ICD-10-CM

## 2023-08-11 NOTE — Therapy (Signed)
 OUTPATIENT PHYSICAL THERAPY SPINE/SHOULDER TREATMENT   Patient Name: Kristi Weiss MRN: [id number] DOB:[birth year], 78 y.o., female Today's Date: 08/11/2023  END OF SESSION:  PT End of Session - 08/11/23 1300     Visit Number 3    Number of Visits 12    Date for PT Re-Evaluation 08/26/23    Authorization Type [employer]    Authorization Time Period no auth no limit    Progress Note Due on Visit 8    PT Start Time 1300    PT Stop Time 1340    PT Time Calculation (min) 40 min    Activity Tolerance Patient tolerated treatment well;Other (comment)   limited by structural deformity   Behavior During Therapy [facility] for tasks assessed/performed              Past Medical History:  Diagnosis Date   Anxiety about health 08/23/2019   Arthritis    Atypical mole 06/30/2001   features of dys nevus- upper left back    Atypical mole 04/04/2020   mod-left upper arm -   Complication of anesthesia    Complication of anesthesia    throat raw from radiation   GERD (gastroesophageal reflux disease)    Hypothyroidism    Hypothyroidism (acquired) 08/23/2019   Melanocytic nevus with features of Dysplastic Nevus 06/30/2001   Upper Left Back   Port-A-Cath in place 08/09/2019   SCC (squamous cell carcinoma) 04/12/2012   Left Forehead (Cx3,5FU)   SCC (squamous cell carcinoma) 03/16/2018   left upper cheek cx3 58fu   Scoliosis    Sinusitis    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   Past Surgical History:  Procedure Laterality Date   BIOPSY  02/08/2023   Procedure: BIOPSY;  Surgeon: Umberto Ganong, Bearl Limes, MD;  Location: [facility];  Service: Gastroenterology;;   ESOPHAGOGASTRODUODENOSCOPY (EGD) WITH PROPOFOL  N/A 02/08/2023   Procedure: ESOPHAGOGASTRODUODENOSCOPY (EGD) WITH PROPOFOL ;  Surgeon: Urban Garden, MD;  Location: [facility];  Service: Gastroenterology;  Laterality: N/A;  1:00PM;ASA 3   NO PAST SURGERIES     No prior surgery     PORT-A-CATH REMOVAL Right 07/29/2022   Procedure: MINOR REMOVAL PORT-A-CATH;  Surgeon: Alanda Allegra, MD;  Location: [facility] OR[facility];  Service: General;  Laterality: Right;   PORTACATH PLACEMENT Right 08/01/2019   Procedure: INSERTION PORT-A-CATH;  Surgeon: Alanda Allegra, MD;  Location: [facility] OR[facility];  Service: General;  Laterality: Right;   Patient Active Problem List   Diagnosis Date Noted   Impacted cerumen of right ear 04/06/2023   Personal history of malignant neoplasm of head and neck 04/06/2023   Abdominal pain, epigastric 02/08/2023   Abdominal pain, chronic, epigastric 03/19/2021   GERD (gastroesophageal reflux disease) 03/19/2021   Dysgeusia 03/19/2021   Acquired lymphedema 08/28/2020   Left ear hearing loss 08/28/2020   Hypothyroidism (acquired) 08/23/2019   Anxiety about health 08/23/2019   Dehydration 08/17/2019   Port-A-Cath in place 08/09/2019   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   Chronic tension-type headache, not intractable 05/30/2019   Difficulty walking 05/22/2013   Arthritis of knee, degenerative 05/18/2013    PCP: Kathyleen Parkins, MD  REFERRING PROVIDER: Agustina Aldrich, MD  REFERRING DIAG: [phone] (ICD-10-CM) - Spondylosis without myelopathy or radiculopathy, cervical region  THERAPY DIAG:  Other idiopathic scoliosis, cervicothoracic region  Stiffness of  left shoulder joint  Somatic dysfunction of scapulothoracic joint  Rationale for Evaluation and Treatment: Rehabilitation  ONSET DATE: End February 2025  SUBJECTIVE:                                                                                                                                                                                                         SUBJECTIVE STATEMENT: Has general pain today; nothing specific or acute; did mow all morning.    Hand dominance:  Right  PERTINENT HISTORY:  -Radiation to left side of neck due to cancer treatment  -Cancer in [place], cancer free now -Cancer treatment in 2021  PAIN:  Are you having pain? Yes: NPRS scale: 5/10 Pain location: left shoulder, chest back Pain description: pulling, feels like something is out of place Aggravating factors: lifting left arm to the side Relieving factors: raising in the front  PRECAUTIONS: None  RED FLAGS: None     WEIGHT BEARING RESTRICTIONS: No  FALLS:  Has patient fallen in last 6 months? No  OCCUPATION: retired  PLOF: Independent  PATIENT GOALS: being able to lift left arm, get back to normal, improve overhead capabilities  NEXT MD VISIT: 6 months  OBJECTIVE:  Note: Objective measures were completed at Evaluation unless otherwise noted.  DIAGNOSTIC FINDINGS:  CLINICAL DATA:  Provided history: Occipital headache. Neck pain on left side.   EXAM: MRI CERVICAL SPINE WITHOUT CONTRAST   TECHNIQUE: Multiplanar, multisequence MR imaging of the cervical spine was performed. No intravenous contrast was administered.   COMPARISON:  Neck CT 12/16/2022.   FINDINGS: Alignment: No significant spondylolisthesis.   Vertebrae: Cervical vertebral body height is maintained. Multilevel degenerative endplate irregularity. Mild degenerative endplate edema at Z6-X0, C4-C5, C5-C6, C6-C7 and C7-T1.   Cord: No signal abnormality identified within the cervical spinal cord.   Posterior Fossa, vertebral arteries, paraspinal tissues: No acute finding within included portions of the posterior fossa. Flow voids preserved within the imaged cervical vertebral arteries. No paraspinal mass or collection.   Disc levels:   Multilevel disc degeneration, greatest at C4-C5, C6-C7 and C7-T1 (advanced at these levels).   C2-C3: Shallow broad-based central disc protrusion. Facet arthropathy. Mild ligamentum flavum thickening. No significant spinal canal or foraminal  stenosis.   C3-C4: Shallow disc bulge with right greater than left uncovertebral hypertrophy. Facet arthropathy. Mild ligamentum flavum thickening. Mild relative spinal canal narrowing. Bilateral neural foraminal narrowing (moderate/severe right, mild left).   C4-C5: Posterior disc osteophyte complex with right greater than left disc osteophyte ridge/uncinate hypertrophy. Facet arthropathy. Mild spinal canal narrowing. Bilateral neural foraminal  narrowing (severe right, moderate/severe left).   C5-C6: Disc bulge with bilateral disc osteophyte ridge/uncinate hypertrophy. Facet arthropathy. Mild ligamentum flavum thickening. Mild spinal canal stenosis. Severe bilateral neural foraminal narrowing.   C6-C7: Slight disc bulge. Left-sided disc osteophyte ridge/uncinate hypertrophy. No significant spinal canal stenosis. Severe left neural foraminal narrowing.   C7-T1: Posterior disc osteophyte complex with bilateral disc osteophyte ridge/uncinate hypertrophy. Mild ligamentum flavum thickening. Mild relative spinal canal narrowing. Mild/moderate bilateral neural foraminal narrowing.   IMPRESSION: 1. Cervical spondylosis as outlined within the body of the report. 2. No more than mild spinal canal stenosis. 3. Multilevel foraminal stenosis, greatest on the right at C3-C4 (moderate/severe), bilaterally at C4-C5 (severe right, moderate/severe left), bilaterally at C5-C6 (severe and on the left at C6-C7 (severe). 4. Disc degeneration is greatest at C4-C5, C6-C7 and C7-T1 (advanced at these levels). 5. Mild multilevel degenerative endplate edema.  CLINICAL DATA:  Left shoulder pain after having neck pain.   EXAM: LEFT SHOULDER - 2+ VIEW   COMPARISON:  None Available.   FINDINGS: Mild glenohumeral joint space narrowing. Mild-to-moderate inferior, anterior and posterior glenoid and inferior humeral head-neck junction degenerative osteophytes. Mild acromioclavicular joint space  narrowing and peripheral osteophytosis with a small chronic ossicle at the superior aspect of the joint. No acute fracture or dislocation.   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  PATIENT SURVEYS:    COGNITION: Overall cognitive status: Within functional limits for tasks assessed  SENSATION: WFL  POSTURE: rounded shoulders, forward head, increased thoracic kyphosis, flexed trunk , and significant alterations of normal anatomical structure of spine, bilateral shoulder blades and shoulders  PALPATION: Pt demonstrates increased tenderness to palpation of cervical, thoracic, and thoracolumbar musculature. Significant abnormal alignment of spine, scapulae, and shoulders noted and muscle imbalance in across back regions noted.    UPPER EXTREMITY ROM:  Active ROM Right eval Left eval  Shoulder flexion 134 110 active, 127 AAROM  Shoulder extension 95 51  Shoulder abduction 145 60 AAROM  Shoulder adduction good More pain minimal limitation  Shoulder extension    Shoulder internal rotation  limited  Shoulder external rotation    Elbow flexion    Elbow extension    Wrist flexion    Wrist extension    Wrist ulnar deviation    Wrist radial deviation    Wrist pronation    Wrist supination     (Blank rows = not tested)  UPPER EXTREMITY MMT:  MMT Right eval Left eval  Shoulder flexion 4+ 4  Shoulder extension 4+ 4  Shoulder abduction 4+ 4  Shoulder adduction 4+ 4  Shoulder extension    Shoulder internal rotation    Shoulder external rotation    Middle trapezius TBA TBA  Lower trapezius TBA TBA  Elbow flexion 4 4  Elbow extension 4 4  Wrist flexion    Wrist extension    Wrist ulnar deviation    Wrist radial deviation    Wrist pronation    Wrist supination    Grip strength 4 4   (Blank rows = not tested)   FUNCTIONAL TESTS:    TREATMENT DATE:  08/11/23 UBE backwards dynamic warm up x 2' Supine: Decompression  exercises 2-5 Dowel overhead shoulder flexion 2 x 10 Shoulder horizontal abduction with red theraband 2 x 10 Shoulder theraband diagonals 2 x 10 each (D2 flexion and extension sword out of sheath) Updated HEP    08/09/2023  -Supine AAROM shoulder flexion with dowel x 12 -Supine AAROM shoulder abduction with dowel x  12 -Supine scapular punches x 30  -Seated middle scapular MMT: R 4+  L4- -Prone scapular pushup at angle on mat table 2 x 12 -L shoulder shrug with 2lb DB 3'' isometric and eccentric control x 20-Mirror for visual cues  07/29/2023   Evaluation: -ROM measured, Strength assessed, HEP prescribed, pt educated on prognosis, findings, and importance of HEP compliance if given.   Therapeutic Exercise: -Scapular retractions, 1 set of 5 reps, 3 second holds -Towel wall slides, L shoulder flexion, 5 reps, 10 second holds                                                                                                                                  PATIENT EDUCATION:  Education details: Pt was educated on findings of PT evaluation, prognosis, frequency of therapy visits and rationale, attendance policy, and HEP if given.   Person educated: Patient Education method: Explanation, Verbal cues, and Handouts Education comprehension: verbalized understanding, verbal cues required, and needs further education  HOME EXERCISE PROGRAM: Access Code: [email or web address] URL: [email or web address] Date: 08/11/2023 Decompression exercises 2-5 - Seated Diagonal Reach with Resistance  - 2 x daily - 7 x weekly - 2 sets - 10 reps - Supine Shoulder Horizontal Abduction with Resistance  - 2 x daily - 7 x weekly - 2 sets - 10 reps Access Code: [email or web address] URL: [email or web address] Date: 08/09/2023 Prepared by: Irene Mannheim  Exercises - Seated Scapular Retraction  - 1 x daily - 7 x weekly - 3 sets - 10 reps - Shoulder Flexion Wall Slide with Towel  - 1 x daily - 7 x weekly  - 3 sets - 10 reps - Seated Shoulder Shrug Circles AROM Backward  - 1 x daily - 7 x weekly - 3 sets - 10 reps - Push Up with Plus  - 1 x daily - 7 x weekly - 3 sets - 10 reps  ASSESSMENT:  CLINICAL IMPRESSION: Started with dynamic warm up on UBE.  Patient with significant curvature in her spine causing imbalance in extremities. Of noted she is not able to extend her right knee fully and her right shoulder is quite forward and elevated compared to the left.  Added in decompression exercises today to work on elongating the spine and increasing postural strength.  She still presents with significant winging right scapula; possibly may benefit from kinesiotape for neuroreducation?  Patient would benefit from skilled physical therapy for decreased muscle imbalance in paraspinal musculature, increased strength in paraspinal and parascapular musculature, and improved posture for improved ability to perform yard work without symptom reproduction, return to higher level of function with ADLs, and progress towards therapy goals.    OBJECTIVE IMPAIRMENTS: decreased activity tolerance, decreased coordination, decreased endurance, decreased mobility, decreased ROM, decreased strength, hypomobility, impaired flexibility, and postural dysfunction.   ACTIVITY LIMITATIONS: carrying, lifting, bending, sitting, standing, squatting, transfers, bed mobility, and reach over head  PARTICIPATION LIMITATIONS: meal prep, cleaning, laundry, shopping,  community activity, and yard work  PERSONAL FACTORS: Age, Time since onset of injury/illness/exacerbation, and 1-2 comorbidities: scoliosis, history of cancer are also affecting patient's functional outcome.   REHAB POTENTIAL: Fair co morbidities  CLINICAL DECISION MAKING: Evolving/moderate complexity  EVALUATION COMPLEXITY: Moderate   GOALS: Goals reviewed with patient? No  SHORT TERM GOALS: Target date: 08/19/23  Pt will be independent with HEP in order to  demonstrate participation in Physical Therapy POC.  Baseline: Goal status: INITIAL  2.  Pt will report 3/10 pain with left shoulder mobility in order to demonstrate improved pain with ADLs.  Baseline:  Goal status: INITIAL  LONG TERM GOALS: Target date: 09/09/23  Pt will report decreased knots in cervical/thoracolumbar spinal musculature in order to demonstrate improved functional mobility and decreased rest breaks during ADL.  Baseline: see objective.  Goal status: INITIAL  2.  Pt will improve left shoulder ROM (flex/ext/lateral flexion/rotation) by combined 20 degrees in order to demonstrate improved functional ambulatory capacity in community setting.  Baseline: see objective.  Goal status: INITIAL  3.  Pt will improve NDI score by at least 11.75 points in order to demonstrate decreased pain with functional goals and outcomes. Baseline: see objective.  Goal status: INITIAL  4.  Pt will report 1/10 pain with left shoulder/scapulothoracic mobility in order to demonstrate reduced pain with ADLs that require over head ability.  Baseline: see objective.  Goal status: INITIAL     PLAN:  PT FREQUENCY: 1-2x/week  PT DURATION: 6 weeks  PLANNED INTERVENTIONS: 97110-Therapeutic exercises, 97530- Therapeutic activity, 97112- Neuromuscular re-education, 97535- Self Care, [id number]- Manual therapy, [phone]- Gait training, Patient/Family education, Dry Needling, Joint mobilization, Spinal mobilization, DME instructions, Cryotherapy, and Moist heat  PLAN FOR NEXT SESSION: review goals and HEP, progress left scapulothoracic mobility, seek counsel for referral to scoliosis specialist or reach out to referring as this seems to be the root cause of lack of mobility.possibly try kinesotape?  1:44 PM, 08/11/23 Anvitha Hutmacher Small Edelyn Heidel MPT [place] [facility] [place] [phone]

## 2023-08-17 ENCOUNTER — Ambulatory Visit (HOSPITAL_COMMUNITY)

## 2023-08-17 ENCOUNTER — Encounter (HOSPITAL_COMMUNITY): Payer: Self-pay

## 2023-08-17 DIAGNOSIS — M4123 Other idiopathic scoliosis, cervicothoracic region: Secondary | ICD-10-CM | POA: Diagnosis not present

## 2023-08-17 DIAGNOSIS — M9902 Segmental and somatic dysfunction of thoracic region: Secondary | ICD-10-CM

## 2023-08-17 DIAGNOSIS — M25612 Stiffness of left shoulder, not elsewhere classified: Secondary | ICD-10-CM

## 2023-08-17 DIAGNOSIS — D518 Other vitamin B12 deficiency anemias: Secondary | ICD-10-CM | POA: Diagnosis not present

## 2023-08-17 NOTE — Therapy (Signed)
 OUTPATIENT PHYSICAL THERAPY SPINE/SHOULDER TREATMENT   Patient Name: Kristi Weiss MRN: [id number] DOB:23-Feb-1946, 78 y.o., female Today's Date: 08/17/2023  END OF SESSION:  PT End of Session - 08/17/23 1350     Visit Number 4    Number of Visits 12    Date for PT Re-Evaluation 08/26/23    Authorization Type [employer]    Authorization Time Period no auth no limit    Progress Note Due on Visit 8    PT Start Time 1350    PT Stop Time 1430    PT Time Calculation (min) 40 min    Activity Tolerance Patient tolerated treatment well;Other (comment)    Behavior During Therapy [facility] for tasks assessed/performed           Past Medical History:  Diagnosis Date   Anxiety about health 08/23/2019   Arthritis    Atypical mole 06/30/2001   features of dys nevus- upper left back    Atypical mole 04/04/2020   mod-left upper arm -   Complication of anesthesia    Complication of anesthesia    throat raw from radiation   GERD (gastroesophageal reflux disease)    Hypothyroidism    Hypothyroidism (acquired) 08/23/2019   Melanocytic nevus with features of Dysplastic Nevus 06/30/2001   Upper Left Back   Port-A-Cath in place 08/09/2019   SCC (squamous cell carcinoma) 04/12/2012   Left Forehead (Cx3,5FU)   SCC (squamous cell carcinoma) 03/16/2018   left upper cheek cx3 27fu   Scoliosis    Sinusitis    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   Past Surgical History:  Procedure Laterality Date   BIOPSY  02/08/2023   Procedure: BIOPSY;  Surgeon: Urban Garden, MD;  Location: [facility];  Service: Gastroenterology;;   ESOPHAGOGASTRODUODENOSCOPY (EGD) WITH PROPOFOL  N/A 02/08/2023   Procedure: ESOPHAGOGASTRODUODENOSCOPY (EGD) WITH PROPOFOL ;  Surgeon: Urban Garden, MD;  Location: [facility];  Service:  Gastroenterology;  Laterality: N/A;  1:00PM;ASA 3   NO PAST SURGERIES     No prior surgery     PORT-A-CATH REMOVAL Right 07/29/2022   Procedure: MINOR REMOVAL PORT-A-CATH;  Surgeon: Alanda Allegra, MD;  Location: [facility] OR[facility];  Service: General;  Laterality: Right;   PORTACATH PLACEMENT Right 08/01/2019   Procedure: INSERTION PORT-A-CATH;  Surgeon: Alanda Allegra, MD;  Location: [facility];  Service: General;  Laterality: Right;   Patient Active Problem List   Diagnosis Date Noted   Impacted cerumen of right ear 04/06/2023   Personal history of malignant neoplasm of head and neck 04/06/2023   Abdominal pain, epigastric 02/08/2023   Abdominal pain, chronic, epigastric 03/19/2021   GERD (gastroesophageal reflux disease) 03/19/2021   Dysgeusia 03/19/2021   Acquired lymphedema 08/28/2020   Left ear hearing loss 08/28/2020   Hypothyroidism (acquired) 08/23/2019   Anxiety about health 08/23/2019   Dehydration 08/17/2019   Port-A-Cath in place 08/09/2019   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   Chronic tension-type headache, not intractable 05/30/2019   Difficulty walking 05/22/2013   Arthritis of knee, degenerative 05/18/2013    PCP: Kathyleen Parkins, MD  REFERRING PROVIDER: Agustina Aldrich, MD  REFERRING DIAG: [phone] (ICD-10-CM) - Spondylosis without myelopathy or radiculopathy, cervical region  THERAPY DIAG:  Other idiopathic scoliosis, cervicothoracic region  Stiffness of left shoulder joint  Somatic dysfunction of scapulothoracic  joint  Rationale for Evaluation and Treatment: Rehabilitation  ONSET DATE: End February 2025  SUBJECTIVE:                                                                                                                                                                                                         SUBJECTIVE STATEMENT: PT reports her pain is about the same, only hurts when trying to raise arm. Reports she hasn't been diligent with HEP  compliance. Reports she does have a visit with orthopedic MD scheduled   Hand dominance: Right  PERTINENT HISTORY:  -Radiation to left side of neck due to cancer treatment  -Cancer in [place], cancer free now -Cancer treatment in 2021  PAIN:  Are you having pain? Yes: NPRS scale: 5/10 Pain location: left shoulder, chest back Pain description: pulling, feels like something is out of place Aggravating factors: lifting left arm to the side Relieving factors: raising in the front  PRECAUTIONS: None  RED FLAGS: None     WEIGHT BEARING RESTRICTIONS: No  FALLS:  Has patient fallen in last 6 months? No  OCCUPATION: retired  PLOF: Independent  PATIENT GOALS: being able to lift left arm, get back to normal, improve overhead capabilities  NEXT MD VISIT: 6 months  OBJECTIVE:  Note: Objective measures were completed at Evaluation unless otherwise noted.  DIAGNOSTIC FINDINGS:  CLINICAL DATA:  Provided history: Occipital headache. Neck pain on left side.   EXAM: MRI CERVICAL SPINE WITHOUT CONTRAST   TECHNIQUE: Multiplanar, multisequence MR imaging of the cervical spine was performed. No intravenous contrast was administered.   COMPARISON:  Neck CT 12/16/2022.   FINDINGS: Alignment: No significant spondylolisthesis.   Vertebrae: Cervical vertebral body height is maintained. Multilevel degenerative endplate irregularity. Mild degenerative endplate edema at W0-J8, C4-C5, C5-C6, C6-C7 and C7-T1.   Cord: No signal abnormality identified within the cervical spinal cord.   Posterior Fossa, vertebral arteries, paraspinal tissues: No acute finding within included portions of the posterior fossa. Flow voids preserved within the imaged cervical vertebral arteries. No paraspinal mass or collection.   Disc levels:   Multilevel disc degeneration, greatest at C4-C5, C6-C7 and C7-T1 (advanced at these levels).   C2-C3: Shallow broad-based central disc protrusion.  Facet arthropathy. Mild ligamentum flavum thickening. No significant spinal canal or foraminal stenosis.   C3-C4: Shallow disc bulge with right greater than left uncovertebral hypertrophy. Facet arthropathy. Mild ligamentum flavum thickening. Mild relative spinal canal narrowing. Bilateral neural foraminal narrowing (moderate/severe right, mild left).   C4-C5: Posterior disc osteophyte complex with right greater than left disc  osteophyte ridge/uncinate hypertrophy. Facet arthropathy. Mild spinal canal narrowing. Bilateral neural foraminal narrowing (severe right, moderate/severe left).   C5-C6: Disc bulge with bilateral disc osteophyte ridge/uncinate hypertrophy. Facet arthropathy. Mild ligamentum flavum thickening. Mild spinal canal stenosis. Severe bilateral neural foraminal narrowing.   C6-C7: Slight disc bulge. Left-sided disc osteophyte ridge/uncinate hypertrophy. No significant spinal canal stenosis. Severe left neural foraminal narrowing.   C7-T1: Posterior disc osteophyte complex with bilateral disc osteophyte ridge/uncinate hypertrophy. Mild ligamentum flavum thickening. Mild relative spinal canal narrowing. Mild/moderate bilateral neural foraminal narrowing.   IMPRESSION: 1. Cervical spondylosis as outlined within the body of the report. 2. No more than mild spinal canal stenosis. 3. Multilevel foraminal stenosis, greatest on the right at C3-C4 (moderate/severe), bilaterally at C4-C5 (severe right, moderate/severe left), bilaterally at C5-C6 (severe and on the left at C6-C7 (severe). 4. Disc degeneration is greatest at C4-C5, C6-C7 and C7-T1 (advanced at these levels). 5. Mild multilevel degenerative endplate edema.  CLINICAL DATA:  Left shoulder pain after having neck pain.   EXAM: LEFT SHOULDER - 2+ VIEW   COMPARISON:  None Available.   FINDINGS: Mild glenohumeral joint space narrowing. Mild-to-moderate inferior, anterior and posterior glenoid and inferior  humeral head-neck junction degenerative osteophytes. Mild acromioclavicular joint space narrowing and peripheral osteophytosis with a small chronic ossicle at the superior aspect of the joint. No acute fracture or dislocation.   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  PATIENT SURVEYS:    COGNITION: Overall cognitive status: Within functional limits for tasks assessed  SENSATION: WFL  POSTURE: rounded shoulders, forward head, increased thoracic kyphosis, flexed trunk , and significant alterations of normal anatomical structure of spine, bilateral shoulder blades and shoulders  PALPATION: Pt demonstrates increased tenderness to palpation of cervical, thoracic, and thoracolumbar musculature. Significant abnormal alignment of spine, scapulae, and shoulders noted and muscle imbalance in across back regions noted.    UPPER EXTREMITY ROM:  Active ROM Right eval Left eval  Shoulder flexion 134 110 active, 127 AAROM  Shoulder extension 95 51  Shoulder abduction 145 60 AAROM  Shoulder adduction good More pain minimal limitation  Shoulder extension    Shoulder internal rotation  limited  Shoulder external rotation    Elbow flexion    Elbow extension    Wrist flexion    Wrist extension    Wrist ulnar deviation    Wrist radial deviation    Wrist pronation    Wrist supination     (Blank rows = not tested)  UPPER EXTREMITY MMT:  MMT Right eval Left eval  Shoulder flexion 4+ 4  Shoulder extension 4+ 4  Shoulder abduction 4+ 4  Shoulder adduction 4+ 4  Shoulder extension    Shoulder internal rotation    Shoulder external rotation    Middle trapezius TBA TBA  Lower trapezius TBA TBA  Elbow flexion 4 4  Elbow extension 4 4  Wrist flexion    Wrist extension    Wrist ulnar deviation    Wrist radial deviation    Wrist pronation    Wrist supination    Grip strength 4 4   (Blank rows = not tested)   FUNCTIONAL TESTS:     TREATMENT DATE:  08/17/23: UBE forwards, 2', lvl 2  Backwards, 2', lvl 2 Pulleys, shoulder flexion, 15x  Shoulder abduction, 15x Shoulder rolls, emphasis on scapular squeeze, 2x10 Push-up Press against wall, 2x10, YTB, verbal and tactile Supine Ts, YTB, 2x10 Review of D2 flexion and extension   08/11/23 UBE backwards dynamic warm up  x 2' Supine: Decompression exercises 2-5 Dowel overhead shoulder flexion 2 x 10 Shoulder horizontal abduction with red theraband 2 x 10 Shoulder theraband diagonals 2 x 10 each (D2 flexion and extension sword out of sheath) Updated HEP    08/09/2023  -Supine AAROM shoulder flexion with dowel x 12 -Supine AAROM shoulder abduction with dowel x 12 -Supine scapular punches x 30  -Seated middle scapular MMT: R 4+  L4- -Prone scapular pushup at angle on mat table 2 x 12 -L shoulder shrug with 2lb DB 3'' isometric and eccentric control x 20-Mirror for visual cues    PATIENT EDUCATION:  Education details: Pt was educated on findings of PT evaluation, prognosis, frequency of therapy visits and rationale, attendance policy, and HEP if given.   Person educated: Patient Education method: Explanation, Verbal cues, and Handouts Education comprehension: verbalized understanding, verbal cues required, and needs further education  HOME EXERCISE PROGRAM: Access Code: [email or web address] URL: [email or web address] Date: 08/11/2023 Decompression exercises 2-5 - Seated Diagonal Reach with Resistance  - 2 x daily - 7 x weekly - 2 sets - 10 reps - Supine Shoulder Horizontal Abduction with Resistance  - 2 x daily - 7 x weekly - 2 sets - 10 reps Access Code: [email or web address] URL: [email or web address] Date: 08/09/2023 Prepared by: Irene Mannheim  Exercises - Seated Scapular Retraction  - 1 x daily - 7 x weekly - 3 sets - 10 reps - Shoulder Flexion Wall Slide with Towel  - 1 x daily - 7 x weekly - 3 sets - 10 reps - Seated Shoulder Shrug Circles  AROM Backward  - 1 x daily - 7 x weekly - 3 sets - 10 reps - Push Up with Plus  - 1 x daily - 7 x weekly - 3 sets - 10 reps  ASSESSMENT:  CLINICAL IMPRESSION: Patient tolerated session well. Begin with warm up on UBE in forward and back ward directions. Followed with pulleys, working into flexion and abduction. Increased discomfort with abduction, patient describing pain as stretch/pull sensation in pec minor and bicep regions. Seated shoulder rolls to promote scapular squeeze on posterior part of roll. Attempts to engage rhomboids with pushup press/ scapular push up against wall. Difficult initially but improvements noted after demonstration and letting pt feel on PT motion we were looking for at scapulae. Increased engagement felt on R side due to significant winging. Patient would benefit from skilled physical therapy for decreased muscle imbalance in paraspinal musculature, increased strength in paraspinal and parascapular musculature, and improved posture for improved ability to perform yard work without symptom reproduction, return to higher level of function with ADLs, and progress towards therapy goals.     OBJECTIVE IMPAIRMENTS: decreased activity tolerance, decreased coordination, decreased endurance, decreased mobility, decreased ROM, decreased strength, hypomobility, impaired flexibility, and postural dysfunction.   ACTIVITY LIMITATIONS: carrying, lifting, bending, sitting, standing, squatting, transfers, bed mobility, and reach over head  PARTICIPATION LIMITATIONS: meal prep, cleaning, laundry, shopping, community activity, and yard work  PERSONAL FACTORS: Age, Time since onset of injury/illness/exacerbation, and 1-2 comorbidities: scoliosis, history of cancer are also affecting patient's functional outcome.   REHAB POTENTIAL: Fair co morbidities  CLINICAL DECISION MAKING: Evolving/moderate complexity  EVALUATION COMPLEXITY: Moderate   GOALS: Goals reviewed with patient?  No  SHORT TERM GOALS: Target date: 08/19/23  Pt will be independent with HEP in order to demonstrate participation in Physical Therapy POC.  Baseline: Goal status: INITIAL  2.  Pt will report 3/10 pain with left shoulder mobility in order to demonstrate  improved pain with ADLs.  Baseline:  Goal status: INITIAL  LONG TERM GOALS: Target date: 09/09/23  Pt will report decreased knots in cervical/thoracolumbar spinal musculature in order to demonstrate improved functional mobility and decreased rest breaks during ADL.  Baseline: see objective.  Goal status: INITIAL  2.  Pt will improve left shoulder ROM (flex/ext/lateral flexion/rotation) by combined 20 degrees in order to demonstrate improved functional ambulatory capacity in community setting.  Baseline: see objective.  Goal status: INITIAL  3.  Pt will improve NDI score by at least 11.75 points in order to demonstrate decreased pain with functional goals and outcomes. Baseline: see objective.  Goal status: INITIAL  4.  Pt will report 1/10 pain with left shoulder/scapulothoracic mobility in order to demonstrate reduced pain with ADLs that require over head ability.  Baseline: see objective.  Goal status: INITIAL     PLAN:  PT FREQUENCY: 1-2x/week  PT DURATION: 6 weeks  PLANNED INTERVENTIONS: 97110-Therapeutic exercises, 97530- Therapeutic activity, 97112- Neuromuscular re-education, 97535- Self Care, [id number]- Manual therapy, [phone]- Gait training, Patient/Family education, Dry Needling, Joint mobilization, Spinal mobilization, DME instructions, Cryotherapy, and Moist heat  PLAN FOR NEXT SESSION:  progress left scapulothoracic mobility, seek counsel for referral to scoliosis specialist or reach out to referring as this seems to be the root cause of lack of mobility.possibly try kinesotape?  4:34 PM, 08/17/23 Marysue Sola, PT, DPT [facility] [place]

## 2023-08-19 ENCOUNTER — Encounter (HOSPITAL_COMMUNITY)

## 2023-08-23 ENCOUNTER — Other Ambulatory Visit (INDEPENDENT_AMBULATORY_CARE_PROVIDER_SITE_OTHER): Payer: Self-pay | Admitting: Gastroenterology

## 2023-08-23 DIAGNOSIS — K219 Gastro-esophageal reflux disease without esophagitis: Secondary | ICD-10-CM

## 2023-08-23 NOTE — Telephone Encounter (Signed)
 Needs office visit.

## 2023-08-24 ENCOUNTER — Encounter (HOSPITAL_COMMUNITY)

## 2023-08-26 ENCOUNTER — Encounter (HOSPITAL_COMMUNITY)

## 2023-08-29 ENCOUNTER — Other Ambulatory Visit (INDEPENDENT_AMBULATORY_CARE_PROVIDER_SITE_OTHER): Payer: Self-pay | Admitting: Gastroenterology

## 2023-08-29 DIAGNOSIS — K219 Gastro-esophageal reflux disease without esophagitis: Secondary | ICD-10-CM

## 2023-08-30 ENCOUNTER — Other Ambulatory Visit (INDEPENDENT_AMBULATORY_CARE_PROVIDER_SITE_OTHER): Payer: Self-pay | Admitting: Gastroenterology

## 2023-08-30 DIAGNOSIS — K219 Gastro-esophageal reflux disease without esophagitis: Secondary | ICD-10-CM

## 2023-08-30 NOTE — Telephone Encounter (Signed)
 Will need a follow up appointment for further refills.

## 2023-09-02 ENCOUNTER — Encounter (HOSPITAL_COMMUNITY)

## 2023-09-02 DIAGNOSIS — M4802 Spinal stenosis, cervical region: Secondary | ICD-10-CM | POA: Diagnosis not present

## 2023-09-02 DIAGNOSIS — R29898 Other symptoms and signs involving the musculoskeletal system: Secondary | ICD-10-CM | POA: Diagnosis not present

## 2023-09-02 DIAGNOSIS — M25512 Pain in left shoulder: Secondary | ICD-10-CM | POA: Diagnosis not present

## 2023-09-02 DIAGNOSIS — M24812 Other specific joint derangements of left shoulder, not elsewhere classified: Secondary | ICD-10-CM | POA: Diagnosis not present

## 2023-09-02 DIAGNOSIS — M25612 Stiffness of left shoulder, not elsewhere classified: Secondary | ICD-10-CM | POA: Diagnosis not present

## 2023-09-02 DIAGNOSIS — G8929 Other chronic pain: Secondary | ICD-10-CM | POA: Diagnosis not present

## 2023-09-22 DIAGNOSIS — E538 Deficiency of other specified B group vitamins: Secondary | ICD-10-CM | POA: Diagnosis not present

## 2023-09-22 DIAGNOSIS — Z0001 Encounter for general adult medical examination with abnormal findings: Secondary | ICD-10-CM | POA: Diagnosis not present

## 2023-09-22 DIAGNOSIS — E663 Overweight: Secondary | ICD-10-CM | POA: Diagnosis not present

## 2023-09-22 DIAGNOSIS — Z6827 Body mass index (BMI) 27.0-27.9, adult: Secondary | ICD-10-CM | POA: Diagnosis not present

## 2023-09-22 DIAGNOSIS — Z9229 Personal history of other drug therapy: Secondary | ICD-10-CM | POA: Diagnosis not present

## 2023-09-22 DIAGNOSIS — I10 Essential (primary) hypertension: Secondary | ICD-10-CM | POA: Diagnosis not present

## 2023-09-22 DIAGNOSIS — E039 Hypothyroidism, unspecified: Secondary | ICD-10-CM | POA: Diagnosis not present

## 2023-09-22 DIAGNOSIS — E785 Hyperlipidemia, unspecified: Secondary | ICD-10-CM | POA: Diagnosis not present

## 2023-09-27 ENCOUNTER — Ambulatory Visit: Admitting: Neurology

## 2023-10-05 ENCOUNTER — Ambulatory Visit (INDEPENDENT_AMBULATORY_CARE_PROVIDER_SITE_OTHER): Payer: PPO | Admitting: Otolaryngology

## 2023-10-07 ENCOUNTER — Encounter (INDEPENDENT_AMBULATORY_CARE_PROVIDER_SITE_OTHER): Payer: Self-pay | Admitting: Otolaryngology

## 2023-10-07 ENCOUNTER — Ambulatory Visit (INDEPENDENT_AMBULATORY_CARE_PROVIDER_SITE_OTHER): Admitting: Otolaryngology

## 2023-10-07 VITALS — BP 152/76 | HR 70

## 2023-10-07 DIAGNOSIS — S46212A Strain of muscle, fascia and tendon of other parts of biceps, left arm, initial encounter: Secondary | ICD-10-CM | POA: Diagnosis not present

## 2023-10-07 DIAGNOSIS — R609 Edema, unspecified: Secondary | ICD-10-CM | POA: Diagnosis not present

## 2023-10-07 DIAGNOSIS — G8929 Other chronic pain: Secondary | ICD-10-CM | POA: Diagnosis not present

## 2023-10-07 DIAGNOSIS — M25612 Stiffness of left shoulder, not elsewhere classified: Secondary | ICD-10-CM | POA: Diagnosis not present

## 2023-10-07 DIAGNOSIS — S43432A Superior glenoid labrum lesion of left shoulder, initial encounter: Secondary | ICD-10-CM | POA: Diagnosis not present

## 2023-10-07 DIAGNOSIS — H6121 Impacted cerumen, right ear: Secondary | ICD-10-CM | POA: Diagnosis not present

## 2023-10-07 DIAGNOSIS — R29898 Other symptoms and signs involving the musculoskeletal system: Secondary | ICD-10-CM | POA: Diagnosis not present

## 2023-10-07 DIAGNOSIS — M24812 Other specific joint derangements of left shoulder, not elsewhere classified: Secondary | ICD-10-CM | POA: Diagnosis not present

## 2023-10-07 DIAGNOSIS — Z8589 Personal history of malignant neoplasm of other organs and systems: Secondary | ICD-10-CM

## 2023-10-07 DIAGNOSIS — M25512 Pain in left shoulder: Secondary | ICD-10-CM | POA: Diagnosis not present

## 2023-10-09 NOTE — Progress Notes (Signed)
 Patient ID: Kristi Weiss, female   DOB: 12/08/1945, 78 y.o.   MRN: [id number]  Follow-up: Left tonsillar squamous cell carcinoma   HPI: The patient is a 78 year old female who returns today for her follow-up evaluation.  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  She completed her chemoradiation treatment in August 2021.  The patient returns today reporting no significant difficulty since her last visit 6 months ago.  She has not noted any suspicious mass or lesion.  She denies any significant dysphagia or dyspnea.     Exam: General: Communicates without difficulty, well nourished, no acute distress. Head: Normocephalic, no evidence injury, no tenderness, facial buttresses intact without stepoff. Face/sinus: No tenderness to palpation and percussion. Facial movement is normal and symmetric. Eyes: PERRL, EOMI. No scleral icterus, conjunctivae clear. Neuro: CN II exam reveals vision grossly intact.  No nystagmus at any point of gaze. Ears: Auricles well formed without lesions.  Right ear cerumen impaction.  The left ear and tympanic membrane are normal.  Nose: External evaluation reveals normal support and skin without lesions.  Dorsum is intact.  Anterior rhinoscopy reveals congested mucosa over anterior aspect of inferior turbinates and intact septum.  No purulence noted. Oral:  Oral cavity and oropharynx are intact, symmetric, without erythema or edema.  Mucosa is moist without lesions. Neck: Full range of motion without pain.  There is no significant lymphadenopathy.  No masses palpable.  Thyroid  bed within normal limits to palpation.  Parotid glands and submandibular glands equal bilaterally without mass.  Trachea is midline. Neuro:  CN 2-12 grossly intact.     Procedure: Right ear cerumen disimpaction Anesthesia: None Description: Under the operating microscope, the cerumen is carefully removed with a combination of cerumen  currette, alligator forceps, and suction catheters.  After the cerumen is removed, the TMs are noted to be normal.  No mass, erythema, or lesions. The patient tolerated the procedure well.     Assessment: 1.  Right ear cerumen impaction.  After the disimpaction procedure, both tympanic membranes and middle ear spaces are noted to be normal. 2.  History of metastatic left tonsillar squamous cell carcinoma.  No recurrent mass or infection is noted today on today's exam.    Plan: 1.  Otomicroscopy with right ear cerumen disimpaction. 2.  The physical exam findings are reviewed with the patient. 3.  The patient is reassured that no recurrent disease is noted today. 4.  The patient will return for reevaluation in 6 months.

## 2023-10-14 DIAGNOSIS — M25612 Stiffness of left shoulder, not elsewhere classified: Secondary | ICD-10-CM | POA: Diagnosis not present

## 2023-10-14 DIAGNOSIS — M25512 Pain in left shoulder: Secondary | ICD-10-CM | POA: Diagnosis not present

## 2023-10-14 DIAGNOSIS — M24812 Other specific joint derangements of left shoulder, not elsewhere classified: Secondary | ICD-10-CM | POA: Diagnosis not present

## 2023-10-14 DIAGNOSIS — G8929 Other chronic pain: Secondary | ICD-10-CM | POA: Diagnosis not present

## 2023-10-14 DIAGNOSIS — R29898 Other symptoms and signs involving the musculoskeletal system: Secondary | ICD-10-CM | POA: Diagnosis not present

## 2023-10-14 DIAGNOSIS — M958 Other specified acquired deformities of musculoskeletal system: Secondary | ICD-10-CM | POA: Diagnosis not present

## 2023-11-09 DIAGNOSIS — M25512 Pain in left shoulder: Secondary | ICD-10-CM | POA: Diagnosis not present

## 2023-11-09 DIAGNOSIS — M25612 Stiffness of left shoulder, not elsewhere classified: Secondary | ICD-10-CM | POA: Diagnosis not present

## 2023-11-09 DIAGNOSIS — G8929 Other chronic pain: Secondary | ICD-10-CM | POA: Diagnosis not present

## 2023-11-09 DIAGNOSIS — M24812 Other specific joint derangements of left shoulder, not elsewhere classified: Secondary | ICD-10-CM | POA: Diagnosis not present

## 2023-11-09 DIAGNOSIS — R269 Unspecified abnormalities of gait and mobility: Secondary | ICD-10-CM | POA: Diagnosis not present

## 2023-11-09 DIAGNOSIS — R29898 Other symptoms and signs involving the musculoskeletal system: Secondary | ICD-10-CM | POA: Diagnosis not present

## 2023-11-25 DIAGNOSIS — M24812 Other specific joint derangements of left shoulder, not elsewhere classified: Secondary | ICD-10-CM | POA: Diagnosis not present

## 2023-11-25 DIAGNOSIS — M898X1 Other specified disorders of bone, shoulder: Secondary | ICD-10-CM | POA: Diagnosis not present

## 2023-11-25 DIAGNOSIS — M958 Other specified acquired deformities of musculoskeletal system: Secondary | ICD-10-CM | POA: Diagnosis not present

## 2023-11-25 DIAGNOSIS — R29898 Other symptoms and signs involving the musculoskeletal system: Secondary | ICD-10-CM | POA: Diagnosis not present

## 2023-11-30 DIAGNOSIS — M898X1 Other specified disorders of bone, shoulder: Secondary | ICD-10-CM | POA: Diagnosis not present

## 2023-11-30 DIAGNOSIS — M25512 Pain in left shoulder: Secondary | ICD-10-CM | POA: Diagnosis not present

## 2023-12-09 DIAGNOSIS — M24812 Other specific joint derangements of left shoulder, not elsewhere classified: Secondary | ICD-10-CM | POA: Diagnosis not present

## 2023-12-09 DIAGNOSIS — M25612 Stiffness of left shoulder, not elsewhere classified: Secondary | ICD-10-CM | POA: Diagnosis not present

## 2023-12-09 DIAGNOSIS — R269 Unspecified abnormalities of gait and mobility: Secondary | ICD-10-CM | POA: Diagnosis not present

## 2023-12-09 DIAGNOSIS — R29898 Other symptoms and signs involving the musculoskeletal system: Secondary | ICD-10-CM | POA: Diagnosis not present

## 2023-12-09 DIAGNOSIS — M25512 Pain in left shoulder: Secondary | ICD-10-CM | POA: Diagnosis not present

## 2023-12-09 DIAGNOSIS — G8929 Other chronic pain: Secondary | ICD-10-CM | POA: Diagnosis not present

## 2023-12-13 ENCOUNTER — Other Ambulatory Visit: Payer: Self-pay

## 2023-12-13 DIAGNOSIS — C099 Malignant neoplasm of tonsil, unspecified: Secondary | ICD-10-CM

## 2023-12-14 ENCOUNTER — Other Ambulatory Visit: Payer: PPO

## 2023-12-14 ENCOUNTER — Inpatient Hospital Stay: Payer: PPO | Attending: Oncology

## 2023-12-14 ENCOUNTER — Ambulatory Visit (HOSPITAL_BASED_OUTPATIENT_CLINIC_OR_DEPARTMENT_OTHER)
Admission: RE | Admit: 2023-12-14 | Discharge: 2023-12-14 | Disposition: A | Discharge status: Home / Self Care | Payer: PPO | Source: Ambulatory Visit | Attending: Hematology | Admitting: Hematology

## 2023-12-14 DIAGNOSIS — C099 Malignant neoplasm of tonsil, unspecified: Secondary | ICD-10-CM | POA: Insufficient documentation

## 2023-12-14 DIAGNOSIS — E039 Hypothyroidism, unspecified: Secondary | ICD-10-CM | POA: Insufficient documentation

## 2023-12-14 LAB — COMPREHENSIVE METABOLIC PANEL WITH GFR
ALT: 12 U/L (ref 0–44)
AST: 21 U/L (ref 15–41)
Albumin: 4.6 g/dL (ref 3.5–5.0)
Alkaline Phosphatase: 102 U/L (ref 38–126)
Anion gap: 9 (ref 5–15)
BUN: 13 mg/dL (ref 8–23)
CO2: 29 mmol/L (ref 22–32)
Calcium: 9.7 mg/dL (ref 8.9–10.3)
Chloride: 100 mmol/L (ref 98–111)
Creatinine, Ser: 0.95 mg/dL (ref 0.44–1.00)
GFR, Estimated: >60 mL/min (ref >60)
Glucose, Bld: 91 mg/dL (ref 70–99)
Potassium: 4.1 mmol/L (ref 3.5–5.1)
Sodium: 138 mmol/L (ref 135–145)
Total Bilirubin: 0.8 mg/dL (ref 0.0–1.2)
Total Protein: 7.2 g/dL (ref 6.5–8.1)

## 2023-12-14 LAB — CBC WITH DIFFERENTIAL/PLATELET
Abs Immature Granulocytes: 0.01 K/uL (ref 0.00–0.07)
Basophils Absolute: 0 K/uL (ref 0.0–0.1)
Basophils Relative: 1 %
Eosinophils Absolute: 0.1 K/uL (ref 0.0–0.5)
Eosinophils Relative: 2 %
HCT: 46.7 % — ABNORMAL HIGH (ref 36.0–46.0)
Hemoglobin: 14.9 g/dL (ref 12.0–15.0)
Immature Granulocytes: 0 %
Lymphocytes Relative: 25 %
Lymphs Abs: 1.3 K/uL (ref 0.7–4.0)
MCH: 29.4 pg (ref 26.0–34.0)
MCHC: 31.9 g/dL (ref 30.0–36.0)
MCV: 92.3 fL (ref 80.0–100.0)
Monocytes Absolute: 0.4 K/uL (ref 0.1–1.0)
Monocytes Relative: 8 %
Neutro Abs: 3.3 K/uL (ref 1.7–7.7)
Neutrophils Relative %: 64 %
Platelets: 292 K/uL (ref 150–400)
RBC: 5.06 MIL/uL (ref 3.87–5.11)
RDW: 13.4 % (ref 11.5–15.5)
WBC: 5 K/uL (ref 4.0–10.5)
nRBC: 0 % (ref 0.0–0.2)

## 2023-12-14 LAB — TSH: TSH: 2.86 u[IU]/mL (ref 0.350–4.500)

## 2023-12-14 MED ORDER — IOHEXOL 300 MG/ML  SOLN
75.0000 mL | Freq: Once | INTRAMUSCULAR | Status: AC | PRN
  Administered 2023-12-14: 75 mL via INTRAVENOUS

## 2023-12-15 ENCOUNTER — Encounter (INDEPENDENT_AMBULATORY_CARE_PROVIDER_SITE_OTHER): Payer: Self-pay | Admitting: Gastroenterology

## 2023-12-21 ENCOUNTER — Ambulatory Visit: Payer: PPO | Admitting: Oncology

## 2023-12-24 DIAGNOSIS — E785 Hyperlipidemia, unspecified: Secondary | ICD-10-CM | POA: Diagnosis not present

## 2023-12-24 DIAGNOSIS — R7309 Other abnormal glucose: Secondary | ICD-10-CM | POA: Diagnosis not present

## 2023-12-24 DIAGNOSIS — Z8639 Personal history of other endocrine, nutritional and metabolic disease: Secondary | ICD-10-CM | POA: Diagnosis not present

## 2023-12-24 DIAGNOSIS — Z23 Encounter for immunization: Secondary | ICD-10-CM | POA: Diagnosis not present

## 2023-12-24 DIAGNOSIS — Z780 Asymptomatic menopausal state: Secondary | ICD-10-CM | POA: Diagnosis not present

## 2023-12-24 DIAGNOSIS — E559 Vitamin D deficiency, unspecified: Secondary | ICD-10-CM | POA: Diagnosis not present

## 2023-12-24 DIAGNOSIS — I10 Essential (primary) hypertension: Secondary | ICD-10-CM | POA: Diagnosis not present

## 2023-12-24 DIAGNOSIS — K219 Gastro-esophageal reflux disease without esophagitis: Secondary | ICD-10-CM | POA: Diagnosis not present

## 2023-12-24 DIAGNOSIS — E039 Hypothyroidism, unspecified: Secondary | ICD-10-CM | POA: Diagnosis not present

## 2023-12-24 DIAGNOSIS — G44229 Chronic tension-type headache, not intractable: Secondary | ICD-10-CM | POA: Diagnosis not present

## 2024-01-03 ENCOUNTER — Inpatient Hospital Stay: Attending: Oncology | Admitting: Oncology

## 2024-01-03 DIAGNOSIS — C099 Malignant neoplasm of tonsil, unspecified: Secondary | ICD-10-CM | POA: Diagnosis not present

## 2024-01-03 DIAGNOSIS — Z85818 Personal history of malignant neoplasm of other sites of lip, oral cavity, and pharynx: Secondary | ICD-10-CM | POA: Insufficient documentation

## 2024-01-03 DIAGNOSIS — Z87891 Personal history of nicotine dependence: Secondary | ICD-10-CM | POA: Insufficient documentation

## 2024-01-03 NOTE — Progress Notes (Signed)
 [facility] [address], [place] [address]    Clinic Day:  01/03/2024  Referring physician: Bertell Satterfield, MD  Patient Care Team: Bertell Satterfield, MD as PCP - General (Internal Medicine) Debera Jayson MATSU, MD as PCP - Cardiology (Cardiology) Shaaron Lamar HERO, MD as Consulting Physician (Gastroenterology) Sheffield, Andrez SAUNDERS, PA-C (Inactive) as Physician Assistant (Dermatology)   ASSESSMENT & PLAN:   Assessment: 1.  Squamous cell carcinoma, p16 positive of the left tonsil: - 06/29/2019 Biopsy A. LYMPH NODE, LEFT NECK, NEEDLE CORE BIOPSY:  - Squamous cell carcinoma, p16 positive.  COMMENT:  The carcinoma is positive with p16, p40, cytokeratin 5/6 and p63 consistent with squamous cell carcinoma.  The carcinoma is negative with Epstein-Barr virus (EBV), TTF-1 and Napsin-A.  -PET scan on 07/18/2019 showed left level 2A lymph node, 1 cm.  Small rounded lymph node 8 mm at level 2B on the left side.  Intense palatine tonsillar uptake with asymmetric fullness of tonsillar tissue and symmetrical FDG activity with masslike changes on the left side measuring 2.2 x 1.7 cm compared to the right.  SUV 16.2.  Small focus of increased activity in the right neck adjacent to or within the small nodule in the right hemithyroid. -An ultrasound of the thyroid  gland showed heterogeneous thyroid  with no masses. -She was evaluated by Dr.Yanagihara. -XRT with weekly cisplatin  started on 08/16/2019.  Week 6 of cisplatin  on 09/20/2019. -XRT completed on 10/05/2019. -PET CT scan on 12/25/2019 showed asymmetric hypermetabolic activity in the right lingual tonsil, similar to pretreatment PET scan.  Hypermetabolic right level 2 lymph node measures 5 mm with SUV 5.5.  A second lymph node measures 5 mm with SUV 4.3.  Complete resolution of the left tonsil, and left level 2 lymph nodes. -PET scan on 04/01/2020 with no hypermetabolic metastatic disease.  Previously seen small hypermetabolic right level  2 neck lymph nodes have resolved.  No tonsillar hypermetabolism.   2.  Thyroid  nodules: - Left thyroid  nodule biopsy on 05/08/2020 consistent with benign follicular nodule, Bethesda category type II.  Right thyroid  FNA was Bethesda category 1.    Plan: 1.  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  No palpable adenopathy.  Lymphedema in the left mental region is stable. - Labs today: Normal LFTs and CBC. - CT soft tissue neck (12/14/23): No evidence of local recurrence. - Continue follow-up with Dr. Karis every 6 months. - RTC 1 year for follow-up with repeat labs and CT scan.   2.  Hypothyroidism: - Continue Synthroid  daily.  TSH is 2.8.   Orders Placed This Encounter  Procedures   CT SOFT TISSUE NECK W CONTRAST    Standing Status:   Future    Expected Date:   01/02/2025    Expiration Date:   04/02/2025    If indicated for the ordered procedure, I authorize the administration of contrast media per Radiology protocol:   Yes    Does the patient have a contrast media/X-ray dye allergy?:   No    Preferred imaging location?:   [facility]   CBC with Differential    Standing Status:   Future    Expected Date:   01/02/2025    Expiration Date:   04/02/2025   Comprehensive metabolic panel    Standing Status:   Future    Expected Date:   01/02/2025    Expiration Date:  04/02/2025   TSH    Standing Status:   Future    Expected Date:   01/02/2025    Expiration Date:   04/02/2025    Delon FORBES Hope, NP   11/3/20252:44 PM  CHIEF COMPLAINT:   Diagnosis: squamous cell carcinoma, p16 positive of the left tonsil    Cancer Staging  Primary tonsillar squamous cell carcinoma (HCC) Staging form: Pharynx - HPV-Mediated Oropharynx, AJCC 8th Edition - Clinical stage from 07/26/2019: Stage I (cT2, cN1, cM0, p16+) - Signed by Rogers Hai, MD on 07/26/2019    Prior Therapy: 1. Cisplatin  &  Aloxi  x 6 cycles from 08/16/2019 to 09/20/2019 2. Chemoradiation therapy with weekly cisplatin  from 08/16/19 to 10/05/19.  Current Therapy:  surveillance   HISTORY OF PRESENT ILLNESS:   Oncology History  Primary tonsillar squamous cell carcinoma (HCC)  07/26/2019 Initial Diagnosis   Squamous cell carcinoma of left tonsil (HCC)   07/26/2019 Cancer Staging   Staging form: Pharynx - HPV-Mediated Oropharynx, AJCC 8th Edition - Clinical stage from 07/26/2019: Stage I (cT2, cN1, cM0, p16+) - Signed by Rogers Hai, MD on 07/26/2019   08/16/2019 - 09/20/2019 Chemotherapy   Patient is on Treatment Plan : HEAD/NECK Cisplatin  q7d        INTERVAL HISTORY:   Khristian is a 78 y.o. female presenting to clinic today for follow up of squamous cell carcinoma, p16 positive of the left tonsil.   Since her last visit, she underwent surveillance neck CT on 12/14/2023.  Reports left shoulder pain, neck and chronic headaches over the past several months.  She has been seen by orthopedics imaging without clear cause.  She is currently undergoing physical therapy for her shoulder which is not helping.  Ortho thinks it could be anxiety related.  She is hoping to make an appointment with neurology at some point.  She reports a new PCP because hers retired.  She was recently started on cholesterol medicine which she is taking daily.  Reports dizziness at times.  Has anxiety and depression.  She is currently caring for her 75 year old grandson who has autism.  Today, she states that she is doing well overall. Her appetite level is at 60%. Her energy level is at 75%.  PAST MEDICAL HISTORY:   Past Medical History: Past Medical History:  Diagnosis Date   Anxiety about health 08/23/2019   Arthritis    Atypical mole 06/30/2001   features of dys nevus- upper left back    Atypical mole 04/04/2020   mod-left upper arm -   Complication of anesthesia    Complication of anesthesia    throat raw from radiation    GERD (gastroesophageal reflux disease)    Hypothyroidism    Hypothyroidism (acquired) 08/23/2019   Melanocytic nevus with features of Dysplastic Nevus 06/30/2001   Upper Left Back   Port-A-Cath in place 08/09/2019   SCC (squamous cell carcinoma) 04/12/2012   Left Forehead (Cx3,5FU)   SCC (squamous cell carcinoma) 03/16/2018   left upper cheek cx3 35fu   Scoliosis    Sinusitis    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    Surgical History: Past Surgical History:  Procedure Laterality Date   BIOPSY  02/08/2023   Procedure: BIOPSY;  Surgeon: Eartha Angelia Sieving, MD;  Location: [facility];  Service: Gastroenterology;;   ESOPHAGOGASTRODUODENOSCOPY (EGD) WITH PROPOFOL  N/A  02/08/2023   Procedure: ESOPHAGOGASTRODUODENOSCOPY (EGD) WITH PROPOFOL ;  Surgeon: Eartha Angelia Sieving, MD;  Location: [facility];  Service: Gastroenterology;  Laterality: N/A;  1:00PM;ASA 3   NO PAST SURGERIES     No prior surgery     PORT-A-CATH REMOVAL Right 07/29/2022   Procedure: MINOR REMOVAL PORT-A-CATH;  Surgeon: Mavis Anes, MD;  Location: [facility];  Service: General;  Laterality: Right;   PORTACATH PLACEMENT Right 08/01/2019   Procedure: INSERTION PORT-A-CATH;  Surgeon: Mavis Anes, MD;  Location: [facility];  Service: General;  Laterality: Right;    Social History: Social History   Socioeconomic History   Marital status: Widowed    Spouse name: Not on file   Number of children: 1   Years of education: Not on file   Highest education level: Not on file  Occupational History   Occupation: RETIRED  Tobacco Use   Smoking status: Former    Passive exposure: Past   Smokeless tobacco: Never   Tobacco comments:    some in her 32s and 30s  Vaping Use   Vaping status: Never Used  Substance and Sexual Activity   Alcohol use: Yes    Comment: once every 2 years    Drug use: Never   Sexual activity: Not Currently  Other Topics Concern   Not on file  Social History Narrative   Lives at home alone   Retired   Widow   Caffeine : about 4 cups coffee, 3 glasses of tea daily   Social Drivers of Corporate Investment Banker Strain: Low Risk  (07/26/2019)   Overall Financial Resource Strain (CARDIA)    Difficulty of Paying Living Expenses: Not hard at all  Food Insecurity: No Food Insecurity (12/24/2023)   Received from [facility]   Hunger Vital Sign    Within the past 12 months, you worried that your food would run out before you got the money to buy more.: Never true    Within the past 12 months, the food you bought just didn't last and you didn't have money to get more.: Never true  Transportation Needs: No Transportation Needs (12/24/2023)   Received from [facility] - Transportation    Lack of Transportation (Medical): No    Lack of Transportation (Non-Medical): No  Physical Activity: Sufficiently Active (08/22/2019)   Exercise Vital Sign    Days of Exercise per Week: 5 days    Minutes of Exercise per Session: 30 min  Stress: Stress Concern Present (07/26/2019)   [employer] - Occupational Stress Questionnaire    Feeling of Stress : Very much  Social Connections: Not on file  Intimate Partner Violence: Not At Risk (07/26/2019)   Humiliation, Afraid, Rape, and Kick questionnaire    Fear of Current or Ex-Partner: No    Emotionally Abused: No    Physically Abused: No    Sexually Abused: No    Family History: Family History  Problem Relation Age of Onset   Lung cancer Mother        smoker   Cancer Mother        Throat   Lung cancer Father        smoker   Diabetes Father    CVA Brother    Diabetes Brother    Diabetes Maternal Grandmother    Heart attack Paternal Grandfather    Migraines Neg Hx    Headache Neg Hx     Current Medications:  Current Outpatient  Medications:     olmesartan-hydrochlorothiazide (BENICAR HCT) 20-12.5 MG tablet, Take 1 tablet by mouth daily., Disp: , Rfl:    Vitamin D, Ergocalciferol, (DRISDOL) 1.25 MG (50000 UNIT) CAPS capsule, Take 1,250 mcg by mouth., Disp: , Rfl:    Calcium Carb-Cholecalciferol (CALTRATE BONE HEALTH PO), Take 1 tablet by mouth daily. (Patient not taking: Reported on 10/07/2023), Disp: , Rfl:    Cyanocobalamin  (VITAMIN B-12 IJ), Inject as directed once a week., Disp: , Rfl:    levothyroxine  (SYNTHROID ) 75 MCG tablet, Take 75 mcg by mouth daily., Disp: , Rfl:    Multiple Vitamin (MULTIVITAMIN) tablet, Take 1 tablet by mouth daily., Disp: , Rfl:    omeprazole  (PRILOSEC) 40 MG capsule, Take 1 capsule by mouth once daily, Disp: 90 capsule, Rfl: 0   rosuvastatin (CRESTOR) 10 MG tablet, Take 10 mg by mouth 2 (two) times a week., Disp: , Rfl:    sucralfate  (CARAFATE ) 1 g tablet, Take 2 g by mouth at bedtime., Disp: , Rfl:    VIT B12-METHIONINE-INOS-CHOL IM, Inject into the muscle., Disp: , Rfl:  No current facility-administered medications for this visit.  Facility-Administered Medications Ordered in Other Visits:    sodium chloride  flush (NS) 0.9 % injection 10 mL, 10 mL, Intravenous, PRN, Lonn, Ni, MD, 10 mL at 08/28/20 1541   Allergies: Allergies  Allergen Reactions   Sulfamethoxazole-Trimethoprim Other (See Comments)    Headaches   Keflex [Cephalexin] Other (See Comments)    Rash or hives post neck   Penicillins Rash    20 years    REVIEW OF SYSTEMS:   Review of Systems  Constitutional:  Positive for fatigue.  Musculoskeletal:  Positive for arthralgias and neck pain.  Neurological:  Positive for dizziness and headaches.  Psychiatric/Behavioral:  Positive for depression. The patient is nervous/anxious.      VITALS:   Blood pressure 134/72, pulse 72, temperature 97.9 F (36.6 C), temperature source Oral, resp. rate 18, height 5' 3 (1.6 m), weight 183 lb (83 kg), SpO2 96%.  Wt Readings from Last 3  Encounters:  01/03/24 183 lb (83 kg)  02/08/23 174 lb (78.9 kg)  02/05/23 174 lb (78.9 kg)    Body mass index is 32.42 kg/m.  Performance status (ECOG): 1 - Symptomatic but completely ambulatory  PHYSICAL EXAM:   Physical Exam Constitutional:      Appearance: Normal appearance.  Cardiovascular:     Rate and Rhythm: Normal rate and regular rhythm.  Pulmonary:     Effort: Pulmonary effort is normal.     Breath sounds: Normal breath sounds.  Abdominal:     General: Bowel sounds are normal.     Palpations: Abdomen is soft.  Musculoskeletal:        General: No swelling. Normal range of motion.  Neurological:     Mental Status: She is alert and oriented to person, place, and time. Mental status is at baseline.     LABS:      Latest Ref Rng & Units 12/14/2023    8:59 AM 12/16/2022   12:51 PM 06/19/2022   10:11 AM  CBC  WBC 4.0 - 10.5 K/uL 5.0  5.4  4.6   Hemoglobin 12.0 - 15.0 g/dL 85.0  86.6  87.2   Hematocrit 36.0 - 46.0 % 46.7  41.0  38.9   Platelets 150 - 400 K/uL 292  272  236       Latest Ref Rng & Units 12/14/2023    8:59 AM 12/16/2022   12:51 PM  06/19/2022   10:11 AM  CMP  Glucose 70 - 99 mg/dL 91  98  887   BUN 8 - 23 mg/dL 13  14  11    Creatinine 0.44 - 1.00 mg/dL 9.04  9.01  9.12   Sodium 135 - 145 mmol/L 138  134  137   Potassium 3.5 - 5.1 mmol/L 4.1  3.9  3.7   Chloride 98 - 111 mmol/L 100  98  103   CO2 22 - 32 mmol/L 29  29  26    Calcium 8.9 - 10.3 mg/dL 9.7  8.9  8.7   Total Protein 6.5 - 8.1 g/dL 7.2  6.9  6.5   Total Bilirubin 0.0 - 1.2 mg/dL 0.8  0.7  0.8   Alkaline Phos 38 - 126 U/L 102  86  80   AST 15 - 41 U/L 21  20  20    ALT 0 - 44 U/L 12  17  16       No results found for: CEA1, CEA / No results found for: CEA1, CEA No results found for: PSA1 No results found for: CAN199 No results found for: CAN125  No results found for: TOTALPROTELP, ALBUMINELP, A1GS, A2GS, BETS, BETA2SER, GAMS, MSPIKE, SPEI No  results found for: TIBC, FERRITIN, IRONPCTSAT Lab Results  Component Value Date   LDH 152 09/06/2019     STUDIES:   CT SOFT TISSUE NECK W CONTRAST Result Date: 12/16/2023 CLINICAL DATA:  Tonsillar squamous cell carcinoma EXAM: CT NECK WITH CONTRAST TECHNIQUE: Multidetector CT imaging of the neck was performed using the standard protocol following the bolus administration of intravenous contrast. RADIATION DOSE REDUCTION: This exam was performed according to the departmental dose-optimization program which includes automated exposure control, adjustment of the mA and/or kV according to patient size and/or use of iterative reconstruction technique. CONTRAST:  75mL OMNIPAQUE  IOHEXOL  300 MG/ML  SOLN COMPARISON:  December 16, 2022 FINDINGS: Pharynx: The nasopharynx, oropharynx and hypopharynx are normal Oral cavity/floor of mouth: Normal Larynx: Normal Salivary glands: The right submandibular and parotid glands are normal. The left submandibular gland and parotid gland are atrophic Thyroid : There is a 15 mm nodule on the posterior aspect of the left lobe which is unchanged. Lymph nodes: No adenopathy Vascular: There is intimal thickening in the left carotid artery. No stenosis Limited intracranial: No significant abnormality Visualized orbits: No significant abnormality Mastoids and visualized paranasal sinuses: No significant abnormality Skeleton: Cervical spondylosis Upper chest: No significant abnormality Other: None IMPRESSION: No abnormal mass or adenopathy Electronically Signed   By: Nancyann Chikita Dogan M.D.   On: 12/16/2023 08:52

## 2024-01-25 DIAGNOSIS — Z8639 Personal history of other endocrine, nutritional and metabolic disease: Secondary | ICD-10-CM | POA: Diagnosis not present

## 2024-01-25 DIAGNOSIS — R6889 Other general symptoms and signs: Secondary | ICD-10-CM | POA: Diagnosis not present

## 2024-01-25 DIAGNOSIS — G2581 Restless legs syndrome: Secondary | ICD-10-CM | POA: Diagnosis not present

## 2024-01-25 DIAGNOSIS — E559 Vitamin D deficiency, unspecified: Secondary | ICD-10-CM | POA: Diagnosis not present

## 2024-01-25 DIAGNOSIS — H539 Unspecified visual disturbance: Secondary | ICD-10-CM | POA: Diagnosis not present

## 2024-01-31 DIAGNOSIS — H40003 Preglaucoma, unspecified, bilateral: Secondary | ICD-10-CM | POA: Diagnosis not present

## 2024-02-28 ENCOUNTER — Encounter: Payer: Self-pay | Admitting: *Deleted

## 2024-04-13 ENCOUNTER — Ambulatory Visit (INDEPENDENT_AMBULATORY_CARE_PROVIDER_SITE_OTHER): Admitting: Otolaryngology

## 2024-12-14 ENCOUNTER — Inpatient Hospital Stay

## 2024-12-14 ENCOUNTER — Other Ambulatory Visit (HOSPITAL_COMMUNITY)

## 2024-12-21 ENCOUNTER — Inpatient Hospital Stay: Admitting: Oncology
